# Patient Record
Sex: Female | Born: 1965
Health system: Southern US, Community
[De-identification: ages and names within clinical notes are randomized; demographics above are authoritative.]

## PROBLEM LIST (undated history)

## (undated) DIAGNOSIS — D582 Other hemoglobinopathies: Secondary | ICD-10-CM

## (undated) DIAGNOSIS — G5603 Carpal tunnel syndrome, bilateral upper limbs: Secondary | ICD-10-CM

## (undated) DIAGNOSIS — G43909 Migraine, unspecified, not intractable, without status migrainosus: Secondary | ICD-10-CM

## (undated) DIAGNOSIS — Z973 Presence of spectacles and contact lenses: Secondary | ICD-10-CM

## (undated) DIAGNOSIS — N883 Incompetence of cervix uteri: Secondary | ICD-10-CM

## (undated) DIAGNOSIS — G4733 Obstructive sleep apnea (adult) (pediatric): Secondary | ICD-10-CM

## (undated) DIAGNOSIS — M1990 Unspecified osteoarthritis, unspecified site: Secondary | ICD-10-CM

## (undated) DIAGNOSIS — I10 Essential (primary) hypertension: Secondary | ICD-10-CM

## (undated) DIAGNOSIS — O009 Unspecified ectopic pregnancy without intrauterine pregnancy: Secondary | ICD-10-CM

## (undated) HISTORY — DX: Migraine, unspecified, not intractable, without status migrainosus: G43.909

## (undated) HISTORY — DX: Incompetence of cervix uteri: N88.3

## (undated) HISTORY — DX: Essential (primary) hypertension: I10

## (undated) HISTORY — DX: Other hemoglobinopathies: D58.2

## (undated) HISTORY — PX: SALPINGECTOMY: SHX328

## (undated) HISTORY — DX: Unspecified ectopic pregnancy without intrauterine pregnancy: O00.90

---

## 1998-01-02 ENCOUNTER — Other Ambulatory Visit: Admission: RE | Admit: 1998-01-02 | Discharge: 1998-01-02 | Payer: Self-pay | Admitting: Obstetrics and Gynecology

## 1998-07-14 ENCOUNTER — Other Ambulatory Visit: Admission: RE | Admit: 1998-07-14 | Discharge: 1998-07-14 | Payer: Self-pay | Admitting: Obstetrics and Gynecology

## 1999-01-04 ENCOUNTER — Other Ambulatory Visit: Admission: RE | Admit: 1999-01-04 | Discharge: 1999-01-04 | Payer: Self-pay | Admitting: Gynecology

## 1999-07-20 ENCOUNTER — Other Ambulatory Visit: Admission: RE | Admit: 1999-07-20 | Discharge: 1999-07-20 | Payer: Self-pay | Admitting: Gynecology

## 1999-10-04 ENCOUNTER — Other Ambulatory Visit: Admission: RE | Admit: 1999-10-04 | Discharge: 1999-10-04 | Payer: Self-pay | Admitting: Gynecology

## 1999-10-04 ENCOUNTER — Encounter (INDEPENDENT_AMBULATORY_CARE_PROVIDER_SITE_OTHER): Payer: Self-pay

## 1999-11-09 ENCOUNTER — Ambulatory Visit (HOSPITAL_COMMUNITY): Admission: RE | Admit: 1999-11-09 | Discharge: 1999-11-09 | Payer: Self-pay | Admitting: Gynecology

## 1999-11-09 ENCOUNTER — Encounter (INDEPENDENT_AMBULATORY_CARE_PROVIDER_SITE_OTHER): Payer: Self-pay

## 2000-01-04 ENCOUNTER — Other Ambulatory Visit: Admission: RE | Admit: 2000-01-04 | Discharge: 2000-01-04 | Payer: Self-pay | Admitting: Gynecology

## 2000-06-03 DIAGNOSIS — Z5189 Encounter for other specified aftercare: Secondary | ICD-10-CM

## 2000-06-03 DIAGNOSIS — O009 Unspecified ectopic pregnancy without intrauterine pregnancy: Secondary | ICD-10-CM

## 2000-06-03 HISTORY — DX: Encounter for other specified aftercare: Z51.89

## 2000-06-03 HISTORY — DX: Unspecified ectopic pregnancy without intrauterine pregnancy: O00.90

## 2000-07-02 ENCOUNTER — Encounter: Admission: RE | Admit: 2000-07-02 | Discharge: 2000-09-30 | Payer: Self-pay | Admitting: Gynecology

## 2000-07-29 ENCOUNTER — Inpatient Hospital Stay (HOSPITAL_COMMUNITY): Admission: AD | Admit: 2000-07-29 | Discharge: 2000-07-29 | Payer: Self-pay | Admitting: Gynecology

## 2000-07-31 ENCOUNTER — Encounter (INDEPENDENT_AMBULATORY_CARE_PROVIDER_SITE_OTHER): Payer: Self-pay

## 2000-07-31 ENCOUNTER — Inpatient Hospital Stay (HOSPITAL_COMMUNITY): Admission: RE | Admit: 2000-07-31 | Discharge: 2000-08-02 | Payer: Self-pay | Admitting: Gynecology

## 2001-01-13 ENCOUNTER — Other Ambulatory Visit: Admission: RE | Admit: 2001-01-13 | Discharge: 2001-01-13 | Payer: Self-pay | Admitting: Gynecology

## 2001-08-12 ENCOUNTER — Other Ambulatory Visit: Admission: RE | Admit: 2001-08-12 | Discharge: 2001-08-12 | Payer: Self-pay | Admitting: Gynecology

## 2001-10-30 ENCOUNTER — Encounter: Payer: Self-pay | Admitting: Gynecology

## 2001-10-30 ENCOUNTER — Inpatient Hospital Stay (HOSPITAL_COMMUNITY): Admission: AD | Admit: 2001-10-30 | Discharge: 2001-11-03 | Payer: Self-pay | Admitting: Gynecology

## 2001-10-31 ENCOUNTER — Encounter: Payer: Self-pay | Admitting: Gynecology

## 2001-11-02 ENCOUNTER — Encounter: Payer: Self-pay | Admitting: Gynecology

## 2002-01-21 ENCOUNTER — Encounter: Payer: Self-pay | Admitting: Gynecology

## 2002-01-21 ENCOUNTER — Inpatient Hospital Stay (HOSPITAL_COMMUNITY): Admission: AD | Admit: 2002-01-21 | Discharge: 2002-01-30 | Payer: Self-pay | Admitting: Gynecology

## 2002-01-21 ENCOUNTER — Encounter (INDEPENDENT_AMBULATORY_CARE_PROVIDER_SITE_OTHER): Payer: Self-pay | Admitting: Specialist

## 2002-01-31 ENCOUNTER — Encounter: Admission: RE | Admit: 2002-01-31 | Discharge: 2002-03-02 | Payer: Self-pay | Admitting: Gynecology

## 2002-03-12 ENCOUNTER — Other Ambulatory Visit: Admission: RE | Admit: 2002-03-12 | Discharge: 2002-03-12 | Payer: Self-pay | Admitting: Gynecology

## 2003-03-15 ENCOUNTER — Other Ambulatory Visit: Admission: RE | Admit: 2003-03-15 | Discharge: 2003-03-15 | Payer: Self-pay | Admitting: Gynecology

## 2004-03-16 ENCOUNTER — Other Ambulatory Visit: Admission: RE | Admit: 2004-03-16 | Discharge: 2004-03-16 | Payer: Self-pay | Admitting: Gynecology

## 2005-03-18 ENCOUNTER — Other Ambulatory Visit: Admission: RE | Admit: 2005-03-18 | Discharge: 2005-03-18 | Payer: Self-pay | Admitting: Gynecology

## 2006-08-20 ENCOUNTER — Other Ambulatory Visit: Admission: RE | Admit: 2006-08-20 | Discharge: 2006-08-20 | Payer: Self-pay | Admitting: Gynecology

## 2007-12-24 ENCOUNTER — Ambulatory Visit (HOSPITAL_COMMUNITY): Admission: RE | Admit: 2007-12-24 | Discharge: 2007-12-24 | Payer: Self-pay | Admitting: Gynecology

## 2007-12-31 ENCOUNTER — Other Ambulatory Visit: Admission: RE | Admit: 2007-12-31 | Discharge: 2007-12-31 | Payer: Self-pay | Admitting: Gynecology

## 2008-02-22 ENCOUNTER — Ambulatory Visit: Payer: Self-pay | Admitting: Women's Health

## 2008-05-16 ENCOUNTER — Ambulatory Visit: Payer: Self-pay | Admitting: Women's Health

## 2008-12-19 ENCOUNTER — Ambulatory Visit: Payer: Self-pay | Admitting: Women's Health

## 2008-12-26 ENCOUNTER — Ambulatory Visit (HOSPITAL_COMMUNITY): Admission: RE | Admit: 2008-12-26 | Discharge: 2008-12-26 | Payer: Self-pay | Admitting: Gynecology

## 2009-01-05 ENCOUNTER — Encounter: Payer: Self-pay | Admitting: Women's Health

## 2009-01-05 ENCOUNTER — Other Ambulatory Visit: Admission: RE | Admit: 2009-01-05 | Discharge: 2009-01-05 | Payer: Self-pay | Admitting: Gynecology

## 2009-01-05 ENCOUNTER — Ambulatory Visit: Payer: Self-pay | Admitting: Women's Health

## 2009-12-18 ENCOUNTER — Ambulatory Visit: Payer: Self-pay | Admitting: Women's Health

## 2009-12-27 ENCOUNTER — Ambulatory Visit (HOSPITAL_COMMUNITY): Admission: RE | Admit: 2009-12-27 | Discharge: 2009-12-27 | Payer: Self-pay | Admitting: Gynecology

## 2010-01-10 ENCOUNTER — Ambulatory Visit: Payer: Self-pay | Admitting: Women's Health

## 2010-01-10 ENCOUNTER — Other Ambulatory Visit: Admission: RE | Admit: 2010-01-10 | Discharge: 2010-01-10 | Payer: Self-pay | Admitting: Gynecology

## 2010-03-05 ENCOUNTER — Ambulatory Visit: Payer: Self-pay | Admitting: Gynecology

## 2010-03-22 ENCOUNTER — Ambulatory Visit: Payer: Self-pay | Admitting: Women's Health

## 2010-04-11 ENCOUNTER — Ambulatory Visit: Payer: Self-pay | Admitting: Women's Health

## 2010-10-19 NOTE — Op Note (Signed)
[facility]  Patient:    Gabrielle Cross, Gabrielle Cross                      MRN: [id number] Proc. Date: 07/31/00 Adm. Date:  [id number] Attending:  Douglass Rivers                           Operative Report  PREOPERATIVE DIAGNOSES:       1. Right ectopic pregnancy.                               2. Shock.  POSTOPERATIVE DIAGNOSES:      1. Ruptured right ectopic pregnancy, mid                                  ampullary portion.                               2. Hemorrhagic shock.                               3. Fibroma, right ovary?  PROCEDURES PERFORMED:         1. Exploratory laparotomy.                               2. Right salpingectomy.                               3. Ovarian adhesiolysis.                               4. Wedge biopsy of right ovary.  SURGEON:                      Juan H. Lily Peer, M.D.  FIRST ASSISTANT:              Douglass Rivers, M.D.  ANESTHESIA:                   General endotracheal anesthesia.  ESTIMATED BLOOD LOSS:  INDICATIONS FOR OPERATION:    A 45 year old gravida 1, para 0 with suspected right ectopic pregnancy.  She was hypotensive, clammy, cold and in shock.  She was taken for emergency exploratory laparotomy and removal of right ectopic pregnancy previous diagnosed on February 27.  FINDINGS:                     1. Hemoperitoneum, approximately 1 L.                               2. Right ruptured ectopic mid ampullary region                                  extending into the cornual region.  3. Growth on right ovary, 1.5 x 2 cm, irregular                                  shaped (fibroma?).                               4. Periovarian adhesions.                               5. Left tube and ovary and uterus otherwise                                  normal.                               6. Normal appearing appendix.  DESCRIPTION OF PROCEDURE:     After the patient was counseled, she was  taken to emergently to the operating room, where she underwent general endotracheal anesthesia.  The abdomen, vagina and perineum were prepped and draped in the usual sterile fashion.  A Hulka tenaculum had been placed for manipulation during the operation through the minilap incision to be able to bring the adnexa to view.  After the drapes and Foley were in place, A small Pfannenstiel incision was made 2 cm above the symphysis pubis.  The incision was carried down through the skin and subcutaneous tissue down to the rectus fascia, whereby a midline nick was made.  The fascia was extended laterally. The peritoneal cavity was entered.  Immediately, blood and blood clots were evident as well as hemoperitoneum.  After removal of the blood clots and placing the patient in the Trendelenburg position, and after placement of OConner-OSullivan retractors, it was noted that she had a ruptured right ectopic pregnancy in the mid portion of the fallopian tube but, with the edema, the ectopic extended all the way through the distal ampullary portion to the cornual region, thus making it difficult to salvage that tube.  We went ahead and proceeded with a right salpingectomy by placing Kelly clamps in the mesosalpinx and excising the right tube.  The mesosalpinx was reapproximated with sutures of 3-0 Vicryl suture in a locking stitch manner.  At this time, it was noted that the right ovary had an irregularly-shaped growth on its surface, which appeared to be a fibroma, and a wedge biopsy was made and submitted for histological evaluation.  Periovarian adhesions were taken down and also submitted for histological evaluation.  The contralateral tube and ovary were normal.  The rest of the uterine surface was normal.  The appendix was normal on gross inspection.  The pelvic cavity was copiously irrigated with normal saline solution.  INTERCEED was then placed over the ovarian wedge biopsy site as well as  the mesosalpinx where the right salpingectomy had been performed to prevent further adhesion formation.  Sponge and needle counts were correct.  The OConner-OSullivan retractor was removed.  The rectus fascia was approximated with a running stitch of 0 Vicryl suture. The subcutaneous bleeders were Bovie cauterized.  The skin was reapproximated with skin clips, followed by placement of Xeroform gauze and 4 x dressing.  The Hulka tenaculum was removed.  The patient was extubated and  transferred to the recovery room with stable vital signs.  Blood loss was approximately 1000 cc. IV fluid was 5 L of lactated Ringers.  Urine output was 400 cc.  She did receive 2 g of Cefotan prophylactically.  Of note, the patient yesterday was seen in the office and a right ectopic pregnancy was diagnosed.  She received methotrexate for treatment.  She had a hemoglobin of 13.1.  Today in the office when she presented with pain and in shock, her hemoglobin was 11.0 and, prior to taking her to the operating room, her hemoglobin had dropped down to 10 with a hematocrit of 30, platelet count 450,000.  Her blood type is O positive. DD:  07/31/00 TD:  07/31/00 Job: [id number]

## 2010-10-19 NOTE — Consult Note (Signed)
[facility]  Patient:    Gabrielle Cross, Gabrielle Cross Visit Number: [id number] MRN: [id number]          Service Type: OBS Location: 910B 9151 01 Attending Physician:  Merrily Pew Dictated by:   Nadyne Coombes Fontaine, M.D. Consultation Date: 10/31/01 Admit Date:  10/30/2001                            Consultation Report  HISTORY:                      The patient was started on magnesium sulfate last night due to uterine contractions and irritability, and has been without contractions since initiation.  The patient has done well throughout the night in Trendelenburg with Foley catheter in place.  Repeat ultrasound this morning showed essentially unchanged findings, with the bulging membranes to the level of the external os, with a measurable cervix length of 0.7 cm.  I discussed with the patient and her family members the current critical situation and issues as to whether to proceed with cervical cerclage at present versus continuing expectant management, hoping that there would be some further retraction of the membranes.  The patient has been in a Trendelenburg position since yesterday afternoon.  She has received IV antibiotics of Unasyn and has been on the magnesium tocolysis.  I reviewed with them the risks of cerclage to include the immediate intraoperative and postoperative risks of rupture of membranes, bleeding, infection and damage to surrounding tissues including the vagina, bladder and bowel.  I reviewed with them the long-term risk that we may get through the cerclage and then have a complication such as infection, rupture of membranes or subsequent labor leading to either previable or periviable delivery and having a severely affected infant.  The issues of whether to proceed with cerclage now, again risking the immediate pregnancy loss versus waiting another day or two was discussed and the risk of waiting with subsequent rupture of  membranes or loss of the pregnancy due to post[poning the cerclage was reviewed with them.  The patient also understands that a successful cerclage is not a guarantee that she will continue her pregnancy, and that bed rest and other preventive measures will be necessary and that we certainly are at risk of tearing out the cerclage given the evidence of the poor cervical strength.  After a lengthy discussion, I feel that we really have nothing further to gain, having been approaching 24 hours in Trendelenburg, having received her antibiotics.  She is on the magnesium tocolysis and has remained quiet since last evening, that further management will probably afford no more benefit and may, in fact, risk the pregnancy when bowel movement or other valsalva-type maneuvers are required.  The patient agrees with the plan.  We plan on proceeding with a cerclage, again, the patient understanding the most immediate risks and accepting the risk of loss of the pregnancy.  The patient does understand that, if rupture of membranes occurs, either intraoperatively or postoperatively, we cannot proceed with her cerclage, and we will have to remove the cerclage and then she will subsequently loose the pregnancy.  The patients questions were answered to her satisfactory and she is ready to proceed with surgery. Dictated by:   Nadyne Coombes. Fontaine, M.D. Attending Physician:  Merrily Pew DD:  10/31/01 TD:  10/31/01 Job: [phone]

## 2010-10-19 NOTE — Discharge Summary (Signed)
[facility]  Patient:    Gabrielle Cross, Gabrielle Cross                      MRN: [id number] Adm. Date:  [id number] Disc. Date: [id number] Attending:  Tonye Royalty Dictator:   Antony Contras, NP                           Discharge Summary  DISCHARGE DIAGNOSES:          1. Ruptured right ectopic pregnancy                                  mid-ampullary region.                               2. Hemorrhagic shock.                               3. Questionable fibroma of the right ovary.  PROCEDURE:                    Exploratory laparotomy, right salpingectomy, wedge biopsy of the right ovary, lysis of adhesions.  HISTORY OF PRESENT ILLNESS:   Patient is a 45 year old gravida 1, para 0 with an LMP of May 24, 2000, [facility] February 28, 2001.  She is currently at 9-1/2 weeks by estimated gestation and had been previously seen in the office on July 29, 2000 on a new OB appointment.  An ultrasound had been ordered due to the fact she had a history of questionable irregular cycles. Ultrasound had demonstrated that at that time she had a right ectopic pregnancy that measured 30 x 23 x 22 mm with central stick focus measuring 12 x 11 mm.  Left upper adnexa was negative.  There was no fluid in the cul-de-sac.  She was initially treated with methotrexate as she had met the criteria and it was totally instantaneous.  She did present to the office again on July 31, 2000 complaining of abdominal pain and feeling weak and ready to pass out.  At that time she was cold and clammy and an ultrasound was done which demonstrated the presence of a right ectopic pregnancy but now there was free fluid in the midline above the left ovary and medial to the right ovary as well.  She was admitted for emergency surgery.  HOSPITAL COURSE/TREATMENT:    Patient was taken to the OR where exploratory laparotomy, right salpingectomy, lysis of adhesions with biopsy of right  ovary was performed by Dr. Lily Peer, assisted by Dr. Farrel Gobble, under general anesthesia.  Findings revealed a hemoperitoneum greater than 1 L, right ruptured ectopic mid-ampullary region extending to the corneal region, questionable fibroma on the right ovary, paraovarian adhesions.  Left tube, ovary, and uterus otherwise normal.  Normal-appearing appendix.  Patient tolerated surgical procedures well.  She did sustain a hemoglobin decrease down to 5.6.  She had initially been 13 on July 29, 2000 and had dropped by July 31, 2000 to 11.  At that time it was decided to transfuse her. She did receive two units of packed cells.  She did remain afebrile and had no difficulty voiding and was able to be discharged in satisfactory condition on her second postoperative day.  DISPOSITION:  She will be followed up in the office in one week. DD:  08/18/00 TD:  08/19/00 Job: [id number]

## 2010-10-19 NOTE — Discharge Summary (Signed)
[facility]  Patient:    Gabrielle Cross, Gabrielle Cross Visit Number: [id number] MRN: [id number]          Service Type: MED Location: 910D 9126 01 Attending Physician:  Merrily Pew Dictated by:   Antony Contras, [facility] Admit Date:  10/30/2001 Discharge Date: 11/03/2001                             Discharge Summary  DISCHARGE DIAGNOSIS:  Incompetent cervix, 19 weeks pregnancy.  PROCEDURE:  Cervical cerclage, [employer] type.  HISTORY OF PRESENT ILLNESS:  The patient is a 45 year old, gravida 2, para 1, AB 1, history of ectopic, last menstrual period 06/16/01, estimated date of confinement 03/25/02.  The patient presented to office for routine screening ultrasound at 19-1/2 weeks and was found to have evidence of an incompetent cervix.  She had no vaginal bleeding or cramping.  Prenatal course to date had been unremarkable.  HOSPITAL COURSE AND TREATMENT:  The patient was admitted to [facility]. On external monitor she was found to have contractions about three to four minutes apart.  She was placed in Trendelenburg position, magnesium sulfate bolus was initiated.  She also received Unasyn.  A six cervical cerclage [employer] type was performed by Dr. Audie Box on 10/30/01, under spinal anesthesia.  Findings included UVA revealed external cervical os dilated with membranes neutralized, single 5 mm nursling band placed, not tied at 12 oclock.  Postoperatively, the patient remained afebrile, had no difficulty with voiding.  She had no further cervical change, no further contractions, and was able to be discharged in satisfactory condition on 11/03/01.  DISPOSITION:  The patient was discharged home with bedrest.  DISCHARGE MEDICATIONS:  Ampicillin 500 mg q.i.d. x12.  FOLLOWUP:  She was to be seen in the office in one week and given stool softeners. Dictated by:   Antony Contras, [facility] Attending Physician:  Merrily Pew DD:  11/16/01 TD:   11/17/01 Job: [id number]

## 2010-10-19 NOTE — H&P (Signed)
[facility]  Patient:    Gabrielle Cross, Gabrielle Cross                      MRN: [id number] Adm. Date:  [id number] Attending:  Douglass Rivers                         History and Physical  CHIEF COMPLAINT:              Abdominal pain with right ectopic pregnancy.  HISTORY OF PRESENT ILLNESS:   The patient is a 45 year old, gravida 1, para 0, last menstrual period May 24, 2000.  Estimated day of confinement February 29, 2000.  The patient currently 9-1/2 weeks by estimated gestation weight.  She was seen in the office July 29, 2000 on a new OB appointment and an ultrasound had been ordered due to the fact that she had a history of questionable irregular periods.  The ultrasound had demonstrated that she had a right ectopic pregnancy.  It measured 30 by 23 by 22 mm with a central cystic focus measuring 12 by 11 mm. Left adnexa was normal and there was no fluid in the cul-de-sac.  She presented to the office today stating that she started having abdominal pain early this morning, felt weak and ready to pass out.  She was cold and clammy when she presented to the office and ultrasound was done which demonstrated the presence of the right ectopic pregnancy but now there was free fluid seen in the midline above the left ovary and medial to the right ovary as well.  On the 26th, she had methotrexate.  She was to be treated conservatively due to the fact that she met the criteria to be treated with methotrexate and it was totally instantaneous.  Her hemoglobin prior to administration of methotrexate was 13.1.  In our office, her hemoglobin was 11.1 and hematocrit 32.1.  Her white blood count was 19.5 thousand and platelet 344,000.  She was taken to the hospital, and prior to being taken to the operating room, her hemoglobin and hematocrit had dropped to 10.0 and 30.0 respectively.  Her blood type is O positive and she was counselled for an emergency  exploratory laparotomy and possible right salpingoscopy, possible right salpingectomy, and possible right salpingo-oophorectomy.  PAST MEDICAL HISTORY:         She had a endometrial polyp removed in May of 2001.  She has otherwise lived healthy.  ALLERGIES:                    She denies any allergies.  PHYSICAL EXAMINATION:  VITAL SIGNS:                  Blood pressure was 90/48, pulse 109.  GENERAL:                      She was cold, clammy, and in a significant amount of pain.  LUNGS:                        Clear to auscultation.  HEART:                        Tachycardic with no murmurs.  ABDOMEN:                      Tender with rebound and guarding.  PELVIC:                       Not performed due to the fact that she just had a transvaginal ultrasound with the above-mentioned findings.  EXTREMITIES:                  No edema.  Reflexes were fine.  LABORATORY DATA:              Patients initial prenatal labs that we have from our office demonstrated a blood type of O positive, negative antibody screen.  VDRL was nonreactive.  Hepatitis B surface-antigen and HIV were negative.  Rubella immuned.  ASSESSMENT:                   A 45 year old, gravida 1, para 0, with known ectopic pregnancy as recently as two days ago began to be treated with methotrexate conservatively.  She presented to the office today cold, clammy, in shock with an increasing amount of pain.  Ultrasound now is showing evidence of fluid in the abdomen consistent with a ruptured ectopic and she is being rushed for an emergency exploratory laparotomy, possible right salpingectomy, possible right salpingoscopy, possible right salpingo-oophorectomy.  The patient also was counselled the possibility for the need to have a blood transfusion with the potential risk such as hepatitis, AIDS, and anaphylactic reaction.  Also the potential complications include trauma to internal organs with the need for corrective  surgery and in the remote possibility of uncontrollable hemorrhage she could lose totally her reproductive organs.  All this was discussed with her and her husband.  Her questions were answered and we will follow according to the plan as listed above.DD:  07/31/00 TD:  07/31/00 Job: [id number]

## 2010-10-19 NOTE — H&P (Signed)
NAME:  Gabrielle Cross, Gabrielle Cross                         ACCOUNT NO.:  [id number]   MEDICAL RECORD NO.:  [id number]                   PATIENT TYPE:  INP   LOCATION:  9159                                 FACILITY:  [facility]   PHYSICIAN:  Timothy P. Fontaine, M.D.           DATE OF BIRTH:  03-08-1966   DATE OF ADMISSION:  01/21/2002  DATE OF DISCHARGE:                                HISTORY & PHYSICAL   CHIEF COMPLAINT:  Rupture of membranes.   HISTORY OF PRESENT ILLNESS:  A 45 year old G2, P0, AB1 female at [redacted] weeks  gestation.  History of incompetent cervix with a salvage Cerclage placed at  [redacted] weeks gestation; at which time she had visible membranes noted.  The  patient has done with bed rest up until this morning, when she noticed a  gush of fluid and repeated gushes of fluid.  On office evaluation was found  to be grossly ruptured.  The patient is admitted at this time for continuing  care.   PAST MEDICAL HISTORY:  Uncomplicated.   PAST SURGICAL HISTORY:  Uterine polyp  excision with D&C.  Wisdom teeth  surgery.   ALLERGIES:  SHELL FISH.   CURRENT MEDICATIONS:  Terbutaline 2.5 mg q.4h.  Prenatal vitamins.   REVIEW OF SYSTEMS:  Noncontributory .   SOCIAL HISTORY:  Noncontributory.   FAMILY HISTORY:  Noncontributory.   PHYSICAL EXAMINATION:  VITAL SIGNS:  Afebrile.  Vital signs are stable.  HEENT:  Normal.  LUNGS:  Clear.  CARDIAC:  Regular rate, without murmurs, rubs or gallops.  ABDOMEN:  With gravid fundus, consistent with dates.  External monitors show  a reactive fetal tracing, with assured term.  Initial evaluation showing  contractions every 4-5 min; not palpated by the patient.  PELVIC:  Deferred.   LABORATORY DATA:  Beta strep and vaginal culture taken.   ASSESSMENT:  A 45 year old G2, P0, AB1 female at [redacted] weeks gestation.  Incompetent cervix, Cerclage in place; with gross rupture of membranes.  Speculum exam in the office by P.A. showed clear fluid with cervix  overall  closed.  External monitors now show some early contractions, which are not  palpated by the patient.  The patient had been on Terbutaline prenataly due  to some contractions, although these were felt to be more Braxton-Hicks,  with her last dose at 4 a.m.  Discussed situation with the patient and her  husband in the various issues; to include:  31 weeks with premature rupture  of the membranes, as well as Cerclage in place.  I reviewed that there  various scenarios possible, as far as her situation.  There is some  controversy as far as her Cerclage, as to whether to remove it now or leave  it in place.  We have agreed to initially give her one injection of  Terbutaline to calm the mild contractions she is having now.  IV hydration,  initiation to antibiotics (to include  Unasyn 3 mg IV piggyback, and then 1.5  g q.6h. Betamethasone 12 mg now, with repeat in 24 hr.  Will leave her  Cerclage in place.  If without contractions, will monitor expectantly.  I  discussed the issue as to if she was undelivered several days from now, as  to whether to remove the Cerclage at that time, versus living in situ;  the  issues of fetal as well as some maternal spesis and significant sequelae  potential was reviewed with her.  We did discuss that if indeed she begins  to contract today, then I may transiently poke a lyser with magnesium to  allow steroid delivery; but that we would subsequently discontinue this.  If  she indeed becomes contractile following this, we would have to remove the  Cerclage and proceed with delivery.  She also understands that if she  becomes febrile, that we would need to proceed with delivery if there is not  an overt nonobstetrical cause.  We will also check a baseline CBC at this  time for white count.  I have ordered a baseline ultrasound for estimated  fetal weight at bedside, after she is admitted.  Will plan on having the DNA  team talk with her also.                                                Timothy P. Audie Box, M.D.    TPF/MEDQ  D:  01/21/2002  T:  01/21/2002  Job:  [id number]

## 2010-10-19 NOTE — Op Note (Signed)
[facility]  Patient:    Gabrielle Cross, Gabrielle Cross                      MRN: [id number] Proc. Date: 11/09/99 Adm. Date:  [id number] Disc. Date: [id number] Attending:  Tonye Royalty                           Operative Report  PREOPERATIVE DIAGNOSIS:       Endometrial polyp/submucous myoma.  POSTOPERATIVE DIAGNOSIS:      Endometrial polyps.  OPERATION:                    Resectoscopic polypectomy.  SURGEON:                      Juan H. Lily Peer, M.D.  ASSISTANT:  ANESTHESIA:  ESTIMATED BLOOD LOSS:  INDICATIONS:                  A 45 year old Gravida 0, with recent evaluation for lower abdominal pelvic pain was found to have on hysterogram an endometrial lesion possible polyp versus myoma.  FINDINGS:                     Normal endocervical canal.  The patient had a polypoid lesion coming from the right anterior uterine wall, the lower uterine segment.  Both tubal ostia were identified and smaller polyp was noted near the upper right uterine side wall near the cornual region.  DESCRIPTION OF PROCEDURE:     After the patient was adequately counselled she was taken to the operating room where she successfully underwent a general endotracheal anesthesia.  She had received a gram of Ceftin IV prophylactically before the commencement of her surgery.  Review of her admission labs had demonstrated that on her urinalysis there were 11 to 20 WBCs and many bacteria.  The patient was taken to the operating room where she was placed in the lithotomy position.  The abdomen was prepped and draped in the usual sterile fashion.  A red rubber Roxan Hockey was inserted to evacuate the bladder contents. A laminaria which was placed the day before in the office was removed.  Thus requiring a cervical dilatation.  The patients anterior cervical lip was grasped with a single tooth tenaculum and the [facility] operating resectoscope with a 90 degree wire loop connected to  electrical surgical generator was inserted through the endocervical canal into the intrauterine cavity.  The staining media with 3% sorbitol. In a systematic fashion the endocervical canal appeared smooth and the endometrial cavity had a polypoid lesion coming off the anterior wall and a smaller one near the upper aspect of the right uterine wall near the cornu region.  The settings on the electrical surgical generation were an 80 watts cutting mode and 80 watts on the coagulation mode.  Then with a 80 degree wire loop these polyps were excised and passed off for histological evaluation.  No other lesions were noted.  The instruments were removed.  The single tooth tenaculum was removed.  The patient was extubated, awakened and transferred to the recovery room with stable vital signs. Blood loss from the procedure was minimal.  Fluid resuscitation consisted of 1100 cc of lactated Ringers.  Will make sure that the patient had a urine culture in the office next week to make sure that her asymptomatic bacteria has cleared. DD:  11/09/99 TD:  11/12/99 Job:  [id number]

## 2010-10-19 NOTE — Op Note (Signed)
[facility]  Patient:    Gabrielle, Cross Visit Number: [id number] MRN: [id number]          Service Type: OBS Location: 910B 9151 01 Attending Physician:  Merrily Pew Dictated by:   Nadyne Coombes Fontaine, M.D. Proc. Date: 10/30/01 Admit Date:  10/30/2001                             Operative Report  PREOPERATIVE DIAGNOSES:       1. Incompetent cervix.                               2. Pregnancy at [redacted] weeks gestation.  POSTOPERATIVE DIAGNOSES:      1. Incompetent cervix.                               2. Pregnancy at [redacted] weeks gestation.  PROCEDURE:                    Cervical cerclage, McDonald type.  SURGEON:                      Timothy P. Fontaine, M.D.  ANESTHESIA:                   Spinal.  ESTIMATED BLOOD LOSS:         Minimal.  COMPLICATIONS:                None.  SPECIMENS:                    None.  FINDINGS:                     EUA revealed external cervical os dilated with membranes visualized.  Single 5 mm Mersilene band placed, knot tied at 12 oclock.  DESCRIPTION OF PROCEDURE:     The patient was taken to the operating room and underwent spinal anesthesia.  She was placed in the dorsal lithotomy position and received a perineal and vaginal preparation with Hibiclens solution. Initial digital vaginal exam gentle noted the external os felt to be close, although there was no pressure or probing done during the digital exam.  The patient was draped in the usual fashion.  The bladder was filled with normal saline solution in an attempt to help reduce the membranes.  A weighted speculum was placed posteriorly and a Sims retractor anteriorly.  Initial inspection showed that the external os was indeed dilated, with bulging membranes visualized.  The upper vagina was copiously irrigated using warm normal saline in an attempt to reduce the bacterial load.  The bladder was filled with normal saline solution in an attempt to help  reduce the membranes. The patient was placed in deep Trendelenburg and a 30 cc Foley catheter was prepared with the tip removed.  This was subsequently placed within the cervical canal and inflated to help reduce the membranes.  With the assistance of ring forceps, gentle traction was placed on the anterior and posterior lip of the cervix.  Using a 5-mm Mersilene tape, a McDonald cerclage was placed without difficulty, noting a good amount of cervix obtained.  The Mersilene band was then tied down to the level of the catheter.  The bulb was then deflated, the band tightened slightly  although not completely, and then multiple ties were placed for future identification of the knot.  Excess suture was then excised.  The vagina was then copiously irrigated with saline, again to reduce bacterial load.  The bladder was then emptied.  The patient was placed in the supine position and was taken to the recovery room in good condition, having tolerated the procedure well. Dictated by:   Nadyne Coombes. Fontaine, M.D. Attending Physician:  Merrily Pew DD:  10/31/01 TD:  10/31/01 Job: [id number]

## 2010-10-19 NOTE — Discharge Summary (Signed)
   NAME:  Gabrielle Cross, Gabrielle Cross                         ACCOUNT NO.:  [id number]   MEDICAL RECORD NO.:  [id number]                   PATIENT TYPE:  INP   LOCATION:  9112                                 FACILITY:  [facility]   PHYSICIAN:  Timothy P. Fontaine, M.D.           DATE OF BIRTH:  July 13, 1965   DATE OF ADMISSION:  01/21/2002  DATE OF DISCHARGE:  01/30/2002                                 DISCHARGE SUMMARY   DISCHARGE DIAGNOSES:  1. Intrauterine pregnancy at 31+ weeks with premature rupture of membranes.  2. History of incompetent cervix.  3. McDonald cerclage.   PROCEDURE:  Normal spontaneous vaginal delivery of viable preterm infant  over midline episiotomy; birth weight 3 pounds 15 ounces, Apgars 8 and 9.   HISTORY OF PRESENT ILLNESS:  The patient is a 45 year old gravida 2 para 0-0-  1-0; LMP June 16, 2001; [facility] March 25, 2002.  Prenatal course was  complicated by incompetent cervix for which the patient had a McDonald  cerclage on May 31.  The patient previously had a ruptured ectopic with her  first pregnancy.  She was also maintained on terbutaline 2.5 mg p.o. q.4h.   LABORATORY DATA:  Blood type O positive, antibody screen negative.  RPR,  HBsAg, HIV nonreactive.  GBS negative.   HOSPITAL COURSE AND TREATMENT:  The patient presented on January 21, 2002  with premature rupture of membranes at 31 and four-sevenths weeks.  At the  present time a cerclage was intact.  She was managed expectantly with  monitoring, IV antibiotics, and did well until January 28, 2002 at which time  she began to have increased contractions, pelvic pressure, and was found to  be dilated to 8 cm.  At that time she proceeded to deliver an Apgar 8 and 67  female infant weighing 3 pounds 15 ounces over a midline episiotomy.   POSTPARTUM COURSE:  She remained afebrile, had no difficulty voiding, was  able to be discharged in satisfactory condition on postpartum day #2.  The  baby was doing well in NICU at  the time of her discharge.  CBC:  Hematocrit  32.4, hemoglobin 10.8, wbc's 13.7, platelets 236.   DISPOSITION:  1. Follow up in six weeks.  2. Tylox for pain.  3. Continue with prenatal vitamins and iron.     Elwyn Lade . Hancock, N.P.                Timothy P. Audie Box, M.D.    MKH/MEDQ  D:  03/01/2002  T:  03/02/2002  Job:  [id number]

## 2010-10-19 NOTE — H&P (Signed)
[facility]  Patient:    Gabrielle Cross, Gabrielle Cross                       MRN: [id number] Adm. Date:  11/09/99 Attending:  Gaetano Hawthorne. Lily Peer, M.D.                         History and Physical  CHIEF COMPLAINT:              Intrauterine lesion.  HISTORY OF PRESENT ILLNESS:   The patient is a 45 year old gravida 0 who is scheduled to undergo resectoscopic polypectomy/possible myomectomy due to incidental finding on ultrasound at time of workup for pelvic pain.  Patient was noted that the ultrasound on May 3 of this year demonstrated two echogenic foci seen within the anterior endometrium and impinging on the canal 6 mm and 4 mm respectively.  She had endometrial biopsy at that time which was reported to be normal.  She has also had a repeat 17-hydroxyprogesterone level which was normal due to the fact she had some hair growth which has responded with the _________ oral contraceptive pill.  She had a DHAS in the past which was normal and testosterone previously was slightly elevated.  As mentioned she is currently on _________ 20 _________ contraceptive pill.  Patient has been provided with literature information on resectoscopic polypectomy/myomectomy. She presented to the office today on June 7, where a laminaria was placed intracervical to facilitate tomorrows procedure for insertion of the upper resectoscope.  Patient was recently treated for sinusitis on Cefotan 250 mg b.i.d. for a seven day course on May 15.  REVIEW OF SYSTEMS:            The patient also had a false positive PPD with normal chest x-ray last year.  Did not require any medication.  She denies any previous surgery.  Denies smoking or alcohol consumption.  Her menses now have been regular otherwise.  ALLERGIES:                    No medical allergies.  MEDICATIONS:                  Recently she was treated with Cefotan 250 mg b.i.d. for a seven day course.  PHYSICAL EXAMINATION:  GENERAL:                       Well-developed, well-nourished female.  HEENT:                        Unremarkable.  NECK:                         Supple.  Trachea midline.  No carotid bruits, no thyromegaly.  LUNGS:                        Clear to auscultation without rhonchi or wheezes.  HEART:                        Regular rate and rhythm, no murmurs or gallops.  BREASTS:                      Not done.  At time of her annual exam was reported to be normal.  ABDOMEN:  Soft, nontender without rebound or guarding.  PELVIC:                       _________ within normal limits.  Vagina and cervix:  No lesion or discharge.  Uterus slightly anteverted, normal size, shape, and consistency.  Mixed without mass or tenderness.  RECTAL:                       Deferred.  ASSESSMENT:                   A 45 year old gravida 0 patient with incidental finding on ultrasound when working up for pelvic pain and she was found to have an intrauterine lesion, polyp versus myoma.  Patient no longer having any abdominal pain.  She would like to have this resolved before attempting to get pregnant.  Patient previously provided with literature information on resectoscopic polypectomy/myomectomy.  Risks, benefits, pros, and cons were discussed to include infection, bleeding, and trauma to internal organs.  In the event of perforation of the uterus, open laparotomy technique may need to be utilized in an effort to correct the damaged organ.  Patient is aware of all this and accepts and will follow with planned scheduled procedure.  PLAN:                         Patient scheduled for resectoscopic polypectomy/myomectomy scheduled for tomorrow, June 8, at [facility]. DD:  11/08/99 TD:  11/08/99 Job: [id number]

## 2010-11-19 ENCOUNTER — Other Ambulatory Visit: Payer: Self-pay | Admitting: Women's Health

## 2010-11-19 DIAGNOSIS — Z1231 Encounter for screening mammogram for malignant neoplasm of breast: Secondary | ICD-10-CM

## 2010-12-31 ENCOUNTER — Ambulatory Visit (HOSPITAL_COMMUNITY)
Admission: RE | Admit: 2010-12-31 | Discharge: 2010-12-31 | Disposition: A | Discharge status: Home / Self Care | Payer: Managed Care, Other (non HMO) | Source: Ambulatory Visit | Attending: Women's Health | Admitting: Women's Health

## 2010-12-31 DIAGNOSIS — Z1231 Encounter for screening mammogram for malignant neoplasm of breast: Secondary | ICD-10-CM

## 2011-01-14 ENCOUNTER — Encounter: Payer: Managed Care, Other (non HMO) | Admitting: Women's Health

## 2011-06-17 ENCOUNTER — Other Ambulatory Visit: Payer: Self-pay | Admitting: Women's Health

## 2011-09-16 ENCOUNTER — Encounter: Payer: Managed Care, Other (non HMO) | Admitting: Women's Health

## 2011-11-12 ENCOUNTER — Other Ambulatory Visit: Payer: Self-pay | Admitting: Women's Health

## 2011-11-12 DIAGNOSIS — Z1231 Encounter for screening mammogram for malignant neoplasm of breast: Secondary | ICD-10-CM

## 2012-01-02 ENCOUNTER — Ambulatory Visit (HOSPITAL_COMMUNITY)
Admission: RE | Admit: 2012-01-02 | Discharge: 2012-01-02 | Disposition: A | Discharge status: Home / Self Care | Payer: Managed Care, Other (non HMO) | Source: Ambulatory Visit | Attending: Women's Health | Admitting: Women's Health

## 2012-01-02 DIAGNOSIS — Z1231 Encounter for screening mammogram for malignant neoplasm of breast: Secondary | ICD-10-CM | POA: Insufficient documentation

## 2012-01-09 ENCOUNTER — Ambulatory Visit (INDEPENDENT_AMBULATORY_CARE_PROVIDER_SITE_OTHER): Payer: Managed Care, Other (non HMO) | Admitting: Women's Health

## 2012-01-09 ENCOUNTER — Encounter: Payer: Self-pay | Admitting: Women's Health

## 2012-01-09 ENCOUNTER — Other Ambulatory Visit (HOSPITAL_COMMUNITY)
Admission: RE | Admit: 2012-01-09 | Discharge: 2012-01-09 | Disposition: A | Discharge status: Home / Self Care | Payer: Managed Care, Other (non HMO) | Source: Ambulatory Visit | Attending: Women's Health | Admitting: Women's Health

## 2012-01-09 VITALS — BP 110/70 | Ht 60.25 in

## 2012-01-09 DIAGNOSIS — Z1151 Encounter for screening for human papillomavirus (HPV): Secondary | ICD-10-CM | POA: Insufficient documentation

## 2012-01-09 DIAGNOSIS — Z13220 Encounter for screening for lipoid disorders: Secondary | ICD-10-CM

## 2012-01-09 DIAGNOSIS — Z01419 Encounter for gynecological examination (general) (routine) without abnormal findings: Secondary | ICD-10-CM | POA: Insufficient documentation

## 2012-01-09 DIAGNOSIS — IMO0001 Reserved for inherently not codable concepts without codable children: Secondary | ICD-10-CM

## 2012-01-09 DIAGNOSIS — Z833 Family history of diabetes mellitus: Secondary | ICD-10-CM

## 2012-01-09 DIAGNOSIS — G43909 Migraine, unspecified, not intractable, without status migrainosus: Secondary | ICD-10-CM | POA: Insufficient documentation

## 2012-01-09 DIAGNOSIS — Z309 Encounter for contraceptive management, unspecified: Secondary | ICD-10-CM

## 2012-01-09 LAB — CBC WITH DIFFERENTIAL/PLATELET
Eosinophils Absolute: 0 10*3/uL (ref 0.0–0.7)
Hemoglobin: 14.3 g/dL (ref 12.0–15.0)
Lymphocytes Relative: 32 % (ref 12–46)
Lymphs Abs: 2.1 10*3/uL (ref 0.7–4.0)
MCH: 28 pg (ref 26.0–34.0)
Monocytes Relative: 6 % (ref 3–12)
Neutro Abs: 4.1 10*3/uL (ref 1.7–7.7)
Neutrophils Relative %: 62 % (ref 43–77)
RBC: 5.1 MIL/uL (ref 3.87–5.11)
WBC: 6.6 10*3/uL (ref 4.0–10.5)

## 2012-01-09 LAB — LIPID PANEL
Cholesterol: 185 mg/dL (ref 0–200)
HDL: 38 mg/dL — ABNORMAL LOW (ref >39)
Total CHOL/HDL Ratio: 4.9 Ratio
Triglycerides: 132 mg/dL (ref <150)

## 2012-01-09 LAB — GLUCOSE, RANDOM: Glucose, Bld: 83 mg/dL (ref 70–99)

## 2012-01-09 MED ORDER — NORETHINDRONE 0.35 MG PO TABS: 1.0000 | ORAL_TABLET | Freq: Every day | ORAL | Status: DC

## 2012-01-09 NOTE — Addendum Note (Signed)
Addended by: Richardson Chiquito on: 01/09/2012 09:08 AM   Modules accepted: Orders

## 2012-01-09 NOTE — Patient Instructions (Addendum)

## 2012-01-09 NOTE — Progress Notes (Signed)
Gabrielle Cross 1966-02-11 [id number]    History:    The patient presents for annual exam.  Contraceptives on Micronor with rare bleeding with no complaints. History of normal mammograms and Paps, last Pap 2011. History of migraines, rare, taking imipramine daily.   Past medical history, past surgical history, family history and social history were all reviewed and documented in the EPIC chart. 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   ROS:  A  ROS was performed and pertinent positives and negatives are included in the history.  Exam:  Filed Vitals:   01/09/12 0800  BP: 110/70    General appearance:  Normal Head/Neck:  Normal, without cervical or supraclavicular adenopathy. Thyroid:  Symmetrical, normal in size, without palpable masses or nodularity. Respiratory  Effort:  Normal  Auscultation:  Clear without wheezing or rhonchi Cardiovascular  Auscultation:  Regular rate, without rubs, murmurs or gallops  Edema/varicosities:  Not grossly evident Abdominal  Soft,nontender, without masses, guarding or rebound.  Liver/spleen:  No organomegaly noted  Hernia:  None appreciated  Skin  Inspection:  Grossly normal  Palpation:  Grossly normal Neurologic/psychiatric  Orientation:  Normal with appropriate conversation.  Mood/affect:  Normal  Genitourinary    Breasts: Examined lying and sitting.     Right: Without masses, retractions, discharge or axillary adenopathy.     Left: Without masses, retractions, discharge or axillary adenopathy.   Inguinal/mons:  Normal without inguinal adenopathy  External genitalia:  Normal  BUS/Urethra/Skene's glands:  Normal  Bladder:  Normal  Vagina:  Normal  Cervix:  Normal/several lobes  Uterus:  normal in size, shape and contour.  Midline and mobile  Adnexa/parametria:     Rt: Without masses or tenderness.   Lt: Without masses or tenderness.  Anus and perineum: Normal  Digital rectal exam: Normal sphincter tone without palpated masses or  tenderness  Assessment/Plan:  46 y.o. MHF G2 P1 for annual exam with no complaints.  Normal GYN exam on Micronor Migraines-rare imipramine daily  Plan: CBC, glucose, lipid panel, UA, Pap. New Pap screening guidelines reviewed. SBE's, continue annual mammogram, calcium rich diet, vitamin D 1000 daily encouraged and reviewed importance of increasing regular exercise. Micronor daily prescription, proper use, slight risk for clots reviewed and given.    Harrington Challenger [facility], 8:22 AM 01/09/2012

## 2012-01-10 LAB — URINALYSIS W MICROSCOPIC + REFLEX CULTURE
Bilirubin Urine: NEGATIVE
Casts: NONE SEEN
Glucose, UA: NEGATIVE mg/dL
Hgb urine dipstick: NEGATIVE
Ketones, ur: NEGATIVE mg/dL
Protein, ur: NEGATIVE mg/dL
pH: 6 (ref 5.0–8.0)

## 2012-07-18 ENCOUNTER — Other Ambulatory Visit: Payer: Self-pay

## 2012-11-24 ENCOUNTER — Other Ambulatory Visit: Payer: Self-pay | Admitting: Women's Health

## 2012-11-24 DIAGNOSIS — Z1231 Encounter for screening mammogram for malignant neoplasm of breast: Secondary | ICD-10-CM

## 2013-01-04 ENCOUNTER — Encounter: Payer: Self-pay | Admitting: Women's Health

## 2013-01-04 ENCOUNTER — Other Ambulatory Visit: Payer: Self-pay | Admitting: Women's Health

## 2013-01-04 ENCOUNTER — Ambulatory Visit (HOSPITAL_COMMUNITY): Payer: 59

## 2013-01-04 ENCOUNTER — Ambulatory Visit (HOSPITAL_COMMUNITY): Payer: Managed Care, Other (non HMO)

## 2013-01-04 DIAGNOSIS — IMO0001 Reserved for inherently not codable concepts without codable children: Secondary | ICD-10-CM

## 2013-01-04 MED ORDER — NORETHINDRONE 0.35 MG PO TABS: 1.0000 | ORAL_TABLET | Freq: Every day | ORAL | Status: DC

## 2013-01-11 ENCOUNTER — Ambulatory Visit (HOSPITAL_COMMUNITY)
Admission: RE | Admit: 2013-01-11 | Discharge: 2013-01-11 | Disposition: A | Discharge status: Home / Self Care | Payer: 59 | Source: Ambulatory Visit | Attending: Women's Health | Admitting: Women's Health

## 2013-01-11 DIAGNOSIS — Z1231 Encounter for screening mammogram for malignant neoplasm of breast: Secondary | ICD-10-CM

## 2013-01-25 ENCOUNTER — Encounter: Payer: Self-pay | Admitting: Gynecology

## 2013-03-12 ENCOUNTER — Encounter: Payer: Self-pay | Admitting: Women's Health

## 2013-04-02 ENCOUNTER — Ambulatory Visit (INDEPENDENT_AMBULATORY_CARE_PROVIDER_SITE_OTHER): Payer: 59 | Admitting: Women's Health

## 2013-04-02 ENCOUNTER — Encounter: Payer: Self-pay | Admitting: Women's Health

## 2013-04-02 VITALS — BP 116/76 | Ht 60.0 in

## 2013-04-02 DIAGNOSIS — Z01419 Encounter for gynecological examination (general) (routine) without abnormal findings: Secondary | ICD-10-CM

## 2013-04-02 DIAGNOSIS — Z13220 Encounter for screening for lipoid disorders: Secondary | ICD-10-CM

## 2013-04-02 DIAGNOSIS — IMO0001 Reserved for inherently not codable concepts without codable children: Secondary | ICD-10-CM

## 2013-04-02 DIAGNOSIS — Z309 Encounter for contraceptive management, unspecified: Secondary | ICD-10-CM

## 2013-04-02 DIAGNOSIS — Z833 Family history of diabetes mellitus: Secondary | ICD-10-CM

## 2013-04-02 LAB — CBC WITH DIFFERENTIAL/PLATELET
Basophils Absolute: 0 10*3/uL (ref 0.0–0.1)
Eosinophils Absolute: 0 10*3/uL (ref 0.0–0.7)
Eosinophils Relative: 0 % (ref 0–5)
Lymphocytes Relative: 30 % (ref 12–46)
Lymphs Abs: 2 10*3/uL (ref 0.7–4.0)
MCV: 82.6 fL (ref 78.0–100.0)
Neutrophils Relative %: 64 % (ref 43–77)
Platelets: 311 10*3/uL (ref 150–400)
RBC: 5.45 MIL/uL — ABNORMAL HIGH (ref 3.87–5.11)
RDW: 14.6 % (ref 11.5–15.5)
WBC: 6.8 10*3/uL (ref 4.0–10.5)

## 2013-04-02 LAB — LIPID PANEL
HDL: 40 mg/dL (ref >39)
LDL Cholesterol: 136 mg/dL — ABNORMAL HIGH (ref 0–99)

## 2013-04-02 LAB — GLUCOSE, RANDOM: Glucose, Bld: 95 mg/dL (ref 70–99)

## 2013-04-02 MED ORDER — NORETHINDRONE 0.35 MG PO TABS: 1.0000 | ORAL_TABLET | Freq: Every day | ORAL | Status: DC

## 2013-04-02 NOTE — Patient Instructions (Signed)

## 2013-04-02 NOTE — Progress Notes (Signed)
Gabrielle Cross [birth year] [id number]    History:    The patient presents for annual exam.  On Micronor with occasional spotting or several day of regular menses. Normal Pap and mammogram history. Migraines on imipramine with good relief/rare migraines per neurologist.   Past medical history, past surgical history, family history and social history were all reviewed and documented in the EPIC chart. [name] doing well. Father hypercholesteremia.   ROS:  A  ROS was performed and pertinent positives and negatives are included in the history.  Exam:  Filed Vitals:   04/02/13 0825  BP: 116/76    General appearance:  Normal Head/Neck:  Normal, without cervical or supraclavicular adenopathy. Thyroid:  Symmetrical, normal in size, without palpable masses or nodularity. Respiratory  Effort:  Normal  Auscultation:  Clear without wheezing or rhonchi Cardiovascular  Auscultation:  Regular rate, without rubs, murmurs or gallops  Edema/varicosities:  Not grossly evident Abdominal  Soft,nontender, without masses, guarding or rebound.  Liver/spleen:  No organomegaly noted  Hernia:  None appreciated  Skin  Inspection:  Grossly normal  Palpation:  Grossly normal Neurologic/psychiatric  Orientation:  Normal with appropriate conversation.  Mood/affect:  Normal  Genitourinary    Breasts: Examined lying and sitting.     Right: Without masses, retractions, discharge or axillary adenopathy.     Left: Without masses, retractions, discharge or axillary adenopathy.   Inguinal/mons:  Normal without inguinal adenopathy  External genitalia:  Normal  BUS/Urethra/Skene's glands:  Normal  Bladder:  Normal  Vagina:  Normal  Cervix:  Normal  Uterus:  normal in size, shape and contour.  Midline and mobile  Adnexa/parametria:     Rt: Without masses or tenderness.   Lt: Without masses or tenderness.  Anus and perineum: Normal  Digital rectal exam: Normal sphincter tone without palpated masses or  tenderness  Assessment/Plan:  47 y.o.MHF G2P1  for annual exam with no complaints other than become short tempered at times.  Normal GYN exam Migraines improved on mipramine   Plan: Micronor prescription, proper use, slight risk for blood clots and strokes reviewed. SBE's, continue annual mammogram, calcium rich diet, vitamin D 1000 daily encouraged. Denies need for counseling. Reviewed importance of increasing regular exercise for stress relief and health. CBC, glucose, lipid panel, UA, no Pap, Pap normal 2013, new screening guidelines reviewed.    Harrington Challenger [facility], 8:47 AM 04/02/2013

## 2013-04-03 LAB — URINALYSIS W MICROSCOPIC + REFLEX CULTURE
Casts: NONE SEEN
Crystals: NONE SEEN
Glucose, UA: NEGATIVE mg/dL
Nitrite: NEGATIVE
Specific Gravity, Urine: 1.023 (ref 1.005–1.030)
pH: 5.5 (ref 5.0–8.0)

## 2013-04-08 ENCOUNTER — Other Ambulatory Visit: Payer: Self-pay

## 2013-06-22 ENCOUNTER — Encounter: Payer: Self-pay | Admitting: Women's Health

## 2013-06-28 ENCOUNTER — Other Ambulatory Visit: Payer: Self-pay | Admitting: Women's Health

## 2013-06-28 DIAGNOSIS — IMO0001 Reserved for inherently not codable concepts without codable children: Secondary | ICD-10-CM

## 2013-06-28 MED ORDER — NORETHINDRONE 0.35 MG PO TABS: 1.0000 | ORAL_TABLET | Freq: Every day | ORAL | Status: DC

## 2013-09-16 ENCOUNTER — Ambulatory Visit (INDEPENDENT_AMBULATORY_CARE_PROVIDER_SITE_OTHER): Payer: 59 | Admitting: Women's Health

## 2013-09-16 DIAGNOSIS — M25561 Pain in right knee: Secondary | ICD-10-CM

## 2013-09-16 DIAGNOSIS — M25569 Pain in unspecified knee: Secondary | ICD-10-CM

## 2013-09-16 NOTE — Progress Notes (Signed)
Patient ID: Gabrielle Cross, female   DOB: [birth year], 48 y.o.   MRN: [id number] Presents with complaint of right knee pain with swelling for over 2 months.  Denies known injury, no strenuous exercise. Questions  where to go for evaluation.  Exam: Appears well. Right knee inner aspect edematous with tenderness with diffuse edema extending down leg.  No mobility limitations, tenderness with movement.  Right knee edema  Plan: Schedule evaluation at [facility] sports medicine center.

## 2013-10-01 ENCOUNTER — Ambulatory Visit (INDEPENDENT_AMBULATORY_CARE_PROVIDER_SITE_OTHER): Payer: 59 | Admitting: Family Medicine

## 2013-10-01 ENCOUNTER — Encounter: Payer: Self-pay | Admitting: Family Medicine

## 2013-10-01 VITALS — BP 110/74 | Ht 60.0 in | Wt 150.0 lb

## 2013-10-01 DIAGNOSIS — M25569 Pain in unspecified knee: Secondary | ICD-10-CM

## 2013-10-01 NOTE — Progress Notes (Signed)
Patient ID: Gabrielle Cross, female   DOB: [birth year], 48 y.o.   MRN: [id number]    [facility] [address] [place], [place] [address] Phone: [phone] Fax: [phone]   Patient Name: Gabrielle Cross Date of Birth: 09/02/1965 Medical Record Number: [id number] Gender: female Date of Encounter: 10/01/2013  History of Present Illness:  Gabrielle Cross is a 48 y.o. very pleasant female patient who presents with the following:  Right knee pain for 2 months. The patient describes that her pain began when she woke one morning without trauma or other inciting event. She describes that the pain is mostly in her popliteal fossa and at first was mild. After that did not resolve and today she began using Aleve 1 pill twice daily for the following 5 days without much improvement. She stopped taking the Aleve when it did not help.  2 weeks ago the pain worsened with right knee swelling which prompted her to call for appointment here today. She states at that time she had difficulty bending her knee and pain with walking. The pain is still mostly in her right popliteal fossa and she had swelling mostly in the superior lateral segment of her knee.  Since that time her pain has been improving but she still has mild swelling.  She denies locking or popping sensations but does have difficulty climbing stairs because of pain.  Patient Active Problem List   Diagnosis Date Noted  . Migraines 01/09/2012   Past Medical History  Diagnosis Date  . Migraine with aura 2008    AURA  BEGAN 2011  . Ruptured ectopic pregnancy 2002    WITH BLOOD TRANSFUSION 2U'S PRBC'S  . Incompetent cervix     DELIVERED AT 31 WEEKS   Past Surgical History  Procedure Laterality Date  . Cerclage placement    . Salpingectomy      RIGHT, RUPTURED ECTOPIC  . Resectoscopic polypectomy     History  Substance Use Topics  . Smoking status: Never Smoker   . Smokeless tobacco: Never Used  .  Alcohol Use: No   Family History  Problem Relation Age of Onset  . Hyperlipidemia Father    Allergies  Allergen Reactions  . Shellfish Allergy     Medication list has been reviewed and updated.  Prior to Admission medications   Medication Sig Start Date End Date Taking? Authorizing Provider  cholecalciferol (VITAMIN D) 1000 UNITS tablet Take 1,000 Units by mouth daily.    Historical Provider, MD  IMIPRAMINE HCL PO Take by mouth.      Historical Provider, MD  norethindrone (MICRONOR,CAMILA,ERRIN) 0.35 MG tablet Take 1 tablet (0.35 mg total) by mouth daily. 06/28/13   [name], NP    Review of Systems:  No fever, chills, sweats No dyspnea No GI upset, or nausea, vomiting, diarrhea No additional swollen joints  Physical Examination: Filed Vitals:   10/01/13 0838  BP: 110/74   Filed Vitals:   10/01/13 0838  Height: 5' (1.524 m)  Weight: 150 lb (68.04 kg)   Body mass index is 29.3 kg/(m^2).  Gen: NAD, alert, cooperative with exam HEENT: NCAT Neuro: Alert and oriented, No gross deficits, normal gait MSK: Left knee within normal limits,   Right knee: Mild effusion, no bruising, no erythema, ligamentously intact with anterior drawer and varus/valgus stress, questionably positive McMurray sign, mild medial joint line tenderness  Assessment and Plan: 48 year old female with right knee pain most likely due to mild meniscus injury. Discussed treatment options including  conservative treatment with compression, ice, and NSAIDs versus steroid injection. Patient at this time and would like to do conservative treatment and will followup if not improved or if she would like her injection.  Timmothy Euler, MD

## 2013-10-01 NOTE — Patient Instructions (Signed)
It was great to meet you today  Try Ice for up to 15 minutes every 2 hours as needed  Also try the compression brace during the day  Come back in 4 weeks if you have not improved or sooner if you get worse or would like the injection.

## 2013-10-01 NOTE — Progress Notes (Signed)
Patient ID: Gabrielle Cross, female   DOB: 11/10/1965, 48 y.o.   MRN: [id number] [place] Attending Note: I have seen and examined this patient. I have discussed this patient with the resident and reviewed the assessment and plan as documented above. I agree with the resident's findings and plan. ULTRASOUND: Small amount of fluid in the suprapatellar pouch and a very small Baker's cyst. Likely small meniscal injury. Patella tendon, quadricep tendon and visible portion of the menisci are intact although the menisci are surrounded by some fluid which makes the think there is a component of meniscal injury. We discussed options and she chose conservative management with icing, compression with a bio helix sleeve and gradually return to activity. NSAIDs when necessary with attention paid to side effects such as GI upset. Should she not improve, would be happy to see her back.

## 2013-10-03 ENCOUNTER — Encounter: Payer: Self-pay | Admitting: Family Medicine

## 2014-03-15 ENCOUNTER — Encounter: Payer: Self-pay | Admitting: Women's Health

## 2014-03-15 ENCOUNTER — Other Ambulatory Visit: Payer: Self-pay | Admitting: Women's Health

## 2014-03-15 MED ORDER — CLINDAMYCIN PHOS-BENZOYL PEROX 1-5 % EX GEL
Freq: Two times a day (BID) | CUTANEOUS | Status: DC
Start: 2014-03-15 — End: 2014-04-21

## 2014-04-04 ENCOUNTER — Encounter: Payer: Self-pay | Admitting: Family Medicine

## 2014-04-21 ENCOUNTER — Encounter: Payer: Self-pay | Admitting: Women's Health

## 2014-04-21 ENCOUNTER — Ambulatory Visit (INDEPENDENT_AMBULATORY_CARE_PROVIDER_SITE_OTHER): Payer: 59 | Admitting: Women's Health

## 2014-04-21 VITALS — BP 118/80 | Ht 61.0 in | Wt 150.0 lb

## 2014-04-21 DIAGNOSIS — Z01419 Encounter for gynecological examination (general) (routine) without abnormal findings: Secondary | ICD-10-CM

## 2014-04-21 DIAGNOSIS — Z833 Family history of diabetes mellitus: Secondary | ICD-10-CM

## 2014-04-21 DIAGNOSIS — Z3040 Encounter for surveillance of contraceptives, unspecified: Secondary | ICD-10-CM

## 2014-04-21 DIAGNOSIS — Z13220 Encounter for screening for lipoid disorders: Secondary | ICD-10-CM

## 2014-04-21 LAB — LIPID PANEL
CHOLESTEROL: 194 mg/dL (ref 0–200)
HDL: 34 mg/dL — ABNORMAL LOW (ref >39)
LDL Cholesterol: 136 mg/dL — ABNORMAL HIGH (ref 0–99)
Total CHOL/HDL Ratio: 5.7 Ratio
Triglycerides: 121 mg/dL (ref <150)
VLDL: 24 mg/dL (ref 0–40)

## 2014-04-21 LAB — CBC WITH DIFFERENTIAL/PLATELET
BASOS ABS: 0 10*3/uL (ref 0.0–0.1)
BASOS PCT: 0 % (ref 0–1)
EOS ABS: 0 10*3/uL (ref 0.0–0.7)
Eosinophils Relative: 0 % (ref 0–5)
HCT: 44.5 % (ref 36.0–46.0)
Hemoglobin: 15.1 g/dL — ABNORMAL HIGH (ref 12.0–15.0)
Lymphocytes Relative: 36 % (ref 12–46)
Lymphs Abs: 2.3 10*3/uL (ref 0.7–4.0)
MCH: 28.3 pg (ref 26.0–34.0)
MCHC: 33.9 g/dL (ref 30.0–36.0)
MCV: 83.5 fL (ref 78.0–100.0)
MPV: 9.7 fL (ref 9.4–12.4)
Monocytes Absolute: 0.4 10*3/uL (ref 0.1–1.0)
Monocytes Relative: 6 % (ref 3–12)
NEUTROS ABS: 3.8 10*3/uL (ref 1.7–7.7)
NEUTROS PCT: 58 % (ref 43–77)
PLATELETS: 363 10*3/uL (ref 150–400)
RBC: 5.33 MIL/uL — ABNORMAL HIGH (ref 3.87–5.11)
RDW: 14.4 % (ref 11.5–15.5)
WBC: 6.5 10*3/uL (ref 4.0–10.5)

## 2014-04-21 LAB — GLUCOSE, RANDOM: GLUCOSE: 97 mg/dL (ref 70–99)

## 2014-04-21 MED ORDER — NORETHINDRONE 0.35 MG PO TABS: 1.0000 | ORAL_TABLET | Freq: Every day | ORAL | Status: DC

## 2014-04-21 NOTE — Patient Instructions (Signed)

## 2014-04-21 NOTE — Addendum Note (Signed)
Addended by: Joaquin Music on: 04/21/2014 11:15 AM   Modules accepted: Orders

## 2014-04-21 NOTE — Progress Notes (Signed)
Gabrielle Cross March 25, 1966 [id number]    History:    Presents for annual exam.  Light spotting or amenorrhea with Micronor. History of migraines with aura, mostly migraine free, currently on daily imipramine for prevention. Normal Pap and mammogram history.  Past medical history, past surgical history, family history and social history were all reviewed and documented in the EPIC chart. Father hypercholesterolemia, mother healthy. [name] doing well.  ROS:  A  12 point ROS was performed and pertinent positives and negatives are included.  Exam:  Filed Vitals:   04/21/14 0850  BP: 118/80    General appearance:  Normal Thyroid:  Symmetrical, normal in size, without palpable masses or nodularity. Respiratory  Auscultation:  Clear without wheezing or rhonchi Cardiovascular  Auscultation:  Regular rate, without rubs, murmurs or gallops  Edema/varicosities:  Not grossly evident Abdominal  Soft,nontender, without masses, guarding or rebound.  Liver/spleen:  No organomegaly noted  Hernia:  None appreciated  Skin  Inspection:  Grossly normal   Breasts: Examined lying and sitting.     Right: Without masses, retractions, discharge or axillary adenopathy.     Left: Without masses, retractions, discharge or axillary adenopathy. Gentitourinary   Inguinal/mons:  Normal without inguinal adenopathy  External genitalia:  Normal  BUS/Urethra/Skene's glands:  Normal  Vagina:  Normal  Cervix:  Normal  Uterus:  normal in size, shape and contour.  Midline and mobile  Adnexa/parametria:     Rt: Without masses or tenderness.   Lt: Without masses or tenderness.  Anus and perineum: Normal  Digital rectal exam: Normal sphincter tone without palpated masses or tenderness  Assessment/Plan:  48 y.o. MHF G2P27for annual exam.   Spotting or amenorrhea on Micronor Migraines with aura on imipramine per neurologist  Plan: Micronor prescription, proper use, slight risk for blood clots reviewed. SBE's,  annual mammogram, instructed to schedule, calcium rich diet, vitamin D 1000 daily encouraged. Increase regular exercise and decrease calories for weight loss encouraged. CBC, lipid panel, glucose, UA, Pap normal with negative HR HPV 2013, new screening guidelines reviewed.    [name] [facility], 10:31 AM 04/21/2014

## 2015-01-04 ENCOUNTER — Other Ambulatory Visit: Payer: Self-pay | Admitting: Women's Health

## 2015-01-04 DIAGNOSIS — Z1231 Encounter for screening mammogram for malignant neoplasm of breast: Secondary | ICD-10-CM

## 2015-01-13 ENCOUNTER — Ambulatory Visit (HOSPITAL_COMMUNITY)
Admission: RE | Admit: 2015-01-13 | Discharge: 2015-01-13 | Disposition: A | Discharge status: Home / Self Care | Payer: 59 | Source: Ambulatory Visit | Attending: Women's Health | Admitting: Women's Health

## 2015-01-13 DIAGNOSIS — Z1231 Encounter for screening mammogram for malignant neoplasm of breast: Secondary | ICD-10-CM | POA: Insufficient documentation

## 2015-01-16 ENCOUNTER — Encounter: Payer: Self-pay | Admitting: Women's Health

## 2015-03-24 ENCOUNTER — Encounter: Payer: Self-pay | Admitting: Women's Health

## 2015-05-03 ENCOUNTER — Other Ambulatory Visit: Payer: Self-pay | Admitting: *Deleted

## 2015-05-03 DIAGNOSIS — Z3040 Encounter for surveillance of contraceptives, unspecified: Secondary | ICD-10-CM

## 2015-05-03 MED ORDER — NORETHINDRONE 0.35 MG PO TABS: 1.0000 | ORAL_TABLET | Freq: Every day | ORAL | Status: DC

## 2015-06-20 ENCOUNTER — Telehealth: Payer: Self-pay | Admitting: *Deleted

## 2015-06-20 DIAGNOSIS — G43909 Migraine, unspecified, not intractable, without status migrainosus: Secondary | ICD-10-CM

## 2015-06-20 DIAGNOSIS — G43019 Migraine without aura, intractable, without status migrainosus: Secondary | ICD-10-CM | POA: Diagnosis not present

## 2015-06-20 DIAGNOSIS — Z13220 Encounter for screening for lipoid disorders: Secondary | ICD-10-CM

## 2015-06-20 NOTE — Telephone Encounter (Signed)
Pt saw her Neurologist and he wanted lab done, per nancy verbal okay to have this done. Orders placed, pt will schedule lab appointment

## 2015-06-21 ENCOUNTER — Other Ambulatory Visit: Payer: Self-pay | Admitting: Women's Health

## 2015-06-26 ENCOUNTER — Other Ambulatory Visit: Payer: 59

## 2015-06-26 DIAGNOSIS — Z13220 Encounter for screening for lipoid disorders: Secondary | ICD-10-CM | POA: Diagnosis not present

## 2015-06-26 DIAGNOSIS — G43909 Migraine, unspecified, not intractable, without status migrainosus: Secondary | ICD-10-CM | POA: Diagnosis not present

## 2015-06-26 LAB — HEMATOCRIT: HEMATOCRIT: 46 % (ref 36.0–46.0)

## 2015-06-26 LAB — BASIC METABOLIC PANEL
BUN: 15 mg/dL (ref 7–25)
CO2: 24 mmol/L (ref 20–31)
Calcium: 9.2 mg/dL (ref 8.6–10.2)
Chloride: 103 mmol/L (ref 98–110)
Creat: 0.76 mg/dL (ref 0.50–1.10)
GLUCOSE: 89 mg/dL (ref 65–99)
Potassium: 4.4 mmol/L (ref 3.5–5.3)
SODIUM: 138 mmol/L (ref 135–146)

## 2015-06-26 LAB — LIPID PANEL
Cholesterol: 202 mg/dL — ABNORMAL HIGH (ref 125–200)
HDL: 39 mg/dL — AB (ref >=46)
LDL Cholesterol: 145 mg/dL — ABNORMAL HIGH (ref <130)
Total CHOL/HDL Ratio: 5.2 Ratio — ABNORMAL HIGH (ref <=5.0)
Triglycerides: 88 mg/dL (ref <150)
VLDL: 18 mg/dL (ref <30)

## 2015-06-26 LAB — ALT: ALT: 14 U/L (ref 6–29)

## 2015-06-26 LAB — AST: AST: 12 U/L (ref 10–35)

## 2015-06-26 LAB — SEDIMENTATION RATE: SED RATE: 6 mm/h (ref 0–20)

## 2015-06-26 LAB — HEMOGLOBIN: HEMOGLOBIN: 15.5 g/dL — AB (ref 12.0–15.0)

## 2015-06-27 ENCOUNTER — Encounter: Payer: Self-pay | Admitting: Women's Health

## 2015-08-30 ENCOUNTER — Ambulatory Visit (INDEPENDENT_AMBULATORY_CARE_PROVIDER_SITE_OTHER): Payer: 59 | Admitting: Women's Health

## 2015-08-30 ENCOUNTER — Encounter: Payer: Self-pay | Admitting: Women's Health

## 2015-08-30 VITALS — BP 126/78 | Ht 61.0 in

## 2015-08-30 DIAGNOSIS — Z3041 Encounter for surveillance of contraceptive pills: Secondary | ICD-10-CM | POA: Diagnosis not present

## 2015-08-30 DIAGNOSIS — Z01419 Encounter for gynecological examination (general) (routine) without abnormal findings: Secondary | ICD-10-CM

## 2015-08-30 MED ORDER — NORETHINDRONE 0.35 MG PO TABS: ORAL_TABLET | ORAL | Status: DC

## 2015-08-30 NOTE — Progress Notes (Signed)
Gabrielle Cross [birth year] [id number]    History:    Presents for annual exam.  Amenorrheic or rare brown spotting on Micronor. History migraines with aura neurologist manages. Normal Pap and mammogram history. History of a right ectopic with salpingectomy.  Past medical history, past surgical history, family history and social history were all reviewed and documented in the EPIC chart. 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 Father hypercholesterolemia. Gabrielle Cross 15 doing well.  ROS:  A ROS was performed and pertinent positives and negatives are included.  Exam:  Filed Vitals:   08/30/15 0811  BP: 126/78    General appearance:  Normal Thyroid:  Symmetrical, normal in size, without palpable masses or nodularity. Respiratory  Auscultation:  Clear without wheezing or rhonchi Cardiovascular  Auscultation:  Regular rate, without rubs, murmurs or gallops  Edema/varicosities:  Not grossly evident Abdominal  Soft,nontender, without masses, guarding or rebound.  Liver/spleen:  No organomegaly noted  Hernia:  None appreciated  Skin  Inspection:  Grossly normal   Breasts: Examined lying and sitting.     Right: Without masses, retractions, discharge or axillary adenopathy.     Left: Without masses, retractions, discharge or axillary adenopathy. Gentitourinary   Inguinal/mons:  Normal without inguinal adenopathy  External genitalia:  Normal  BUS/Urethra/Skene's glands:  Normal  Vagina:  Normal  Cervix:  Normal  Uterus:  normal in size, shape and contour.  Midline and mobile  Adnexa/parametria:     Rt: Without masses or tenderness.   Lt: Without masses or tenderness.  Anus and perineum: Normal  Digital rectal exam: Normal sphincter tone without palpated masses or tenderness  Assessment/Plan:  50 y.o. MHF G2 P1  for annual exam with no complaints.  Amenorrheic on Micronor Migraines with aura-neurologist manages doing better on daily imipramine  Plan: Micronor prescription, proper  use given and reviewed slight risks for blood clots. SBE's, continue annual 3-D mammogram. Reviewed importance of increasing regular exercise, decreasing calories for weight loss, less than 20 g saturated fat daily, HDL low at 39, LDL elevated. Red rice yeast supplement encouraged. UA, Pap with HR HPV typing, new screening guidelines reviewed.  Gabrielle Cross, 8:49 AM 08/30/2015

## 2015-08-30 NOTE — Patient Instructions (Signed)
Health Maintenance, Female Adopting a healthy lifestyle and getting preventive care can go a long way to promote health and wellness. Talk with your health care provider about what schedule of regular examinations is right for you. This is a good chance for you to check in with your provider about disease prevention and staying healthy. In between checkups, there are plenty of things you can do on your own. Experts have done a lot of research about which lifestyle changes and preventive measures are most likely to keep you healthy. Ask your health care provider for more information. WEIGHT AND DIET  Eat a healthy diet  Be sure to include plenty of vegetables, fruits, low-fat dairy products, and lean protein.  Do not eat a lot of foods high in solid fats, added sugars, or salt.  Get regular exercise. This is one of the most important things you can do for your health.  Most adults should exercise for at least 150 minutes each week. The exercise should increase your heart rate and make you sweat (moderate-intensity exercise).  Most adults should also do strengthening exercises at least twice a week. This is in addition to the moderate-intensity exercise.  Maintain a healthy weight  Body mass index (BMI) is a measurement that can be used to identify possible weight problems. It estimates body fat based on height and weight. Your health care provider can help determine your BMI and help you achieve or maintain a healthy weight.  For females 20 years of age and older:   A BMI below 18.5 is considered underweight.  A BMI of 18.5 to 24.9 is normal.  A BMI of 25 to 29.9 is considered overweight.  A BMI of 30 and above is considered obese.  Watch levels of cholesterol and blood lipids  You should start having your blood tested for lipids and cholesterol at 48 years of age, then have this test every 5 years.  You may need to have your cholesterol levels checked more often if:  Your lipid  or cholesterol levels are high.  You are older than 48 years of age.  You are at high risk for heart disease.  CANCER SCREENING   Lung Cancer  Lung cancer screening is recommended for adults 55-80 years old who are at high risk for lung cancer because of a history of smoking.  A yearly low-dose CT scan of the lungs is recommended for people who:  Currently smoke.  Have quit within the past 15 years.  Have at least a 30-pack-year history of smoking. A pack year is smoking an average of one pack of cigarettes a day for 1 year.  Yearly screening should continue until it has been 15 years since you quit.  Yearly screening should stop if you develop a health problem that would prevent you from having lung cancer treatment.  Breast Cancer  Practice breast self-awareness. This means understanding how your breasts normally appear and feel.  It also means doing regular breast self-exams. Let your health care provider know about any changes, no matter how small.  If you are in your 20s or 30s, you should have a clinical breast exam (CBE) by a health care provider every 1-3 years as part of a regular health exam.  If you are 40 or older, have a CBE every year. Also consider having a breast X-ray (mammogram) every year.  If you have a family history of breast cancer, talk to your health care provider about genetic screening.  If you   are at high risk for breast cancer, talk to your health care provider about having an MRI and a mammogram every year.  Breast cancer gene (BRCA) assessment is recommended for women who have family members with BRCA-related cancers. BRCA-related cancers include:  Breast.  Ovarian.  Tubal.  Peritoneal cancers.  Results of the assessment will determine the need for genetic counseling and BRCA1 and BRCA2 testing. Cervical Cancer Your health care provider may recommend that you be screened regularly for cancer of the pelvic organs (ovaries, uterus, and  vagina). This screening involves a pelvic examination, including checking for microscopic changes to the surface of your cervix (Pap test). You may be encouraged to have this screening done every 3 years, beginning at age 21.  For women ages 30-65, health care providers may recommend pelvic exams and Pap testing every 3 years, or they may recommend the Pap and pelvic exam, combined with testing for human papilloma virus (HPV), every 5 years. Some types of HPV increase your risk of cervical cancer. Testing for HPV may also be done on women of any age with unclear Pap test results.  Other health care providers may not recommend any screening for nonpregnant women who are considered low risk for pelvic cancer and who do not have symptoms. Ask your health care provider if a screening pelvic exam is right for you.  If you have had past treatment for cervical cancer or a condition that could lead to cancer, you need Pap tests and screening for cancer for at least 20 years after your treatment. If Pap tests have been discontinued, your risk factors (such as having a new sexual partner) need to be reassessed to determine if screening should resume. Some women have medical problems that increase the chance of getting cervical cancer. In these cases, your health care provider may recommend more frequent screening and Pap tests. Colorectal Cancer  This type of cancer can be detected and often prevented.  Routine colorectal cancer screening usually begins at 48 years of age and continues through 48 years of age.  Your health care provider may recommend screening at an earlier age if you have risk factors for colon cancer.  Your health care provider may also recommend using home test kits to check for hidden blood in the stool.  A small camera at the end of a tube can be used to examine your colon directly (sigmoidoscopy or colonoscopy). This is done to check for the earliest forms of colorectal  cancer.  Routine screening usually begins at age 50.  Direct examination of the colon should be repeated every 5-10 years through 48 years of age. However, you may need to be screened more often if early forms of precancerous polyps or small growths are found. Skin Cancer  Check your skin from head to toe regularly.  Tell your health care provider about any new moles or changes in moles, especially if there is a change in a mole's shape or color.  Also tell your health care provider if you have a mole that is larger than the size of a pencil eraser.  Always use sunscreen. Apply sunscreen liberally and repeatedly throughout the day.  Protect yourself by wearing long sleeves, pants, a wide-brimmed hat, and sunglasses whenever you are outside. HEART DISEASE, DIABETES, AND HIGH BLOOD PRESSURE   High blood pressure causes heart disease and increases the risk of stroke. High blood pressure is more likely to develop in:  People who have blood pressure in the high end   of the normal range (130-139/85-89 mm Hg).  People who are overweight or obese.  People who are African American.  If you are 38-23 years of age, have your blood pressure checked every 3-5 years. If you are 61 years of age or older, have your blood pressure checked every year. You should have your blood pressure measured twice--once when you are at a hospital or clinic, and once when you are not at a hospital or clinic. Record the average of the two measurements. To check your blood pressure when you are not at a hospital or clinic, you can use:  An automated blood pressure machine at a pharmacy.  A home blood pressure monitor.  If you are between 45 years and 39 years old, ask your health care provider if you should take aspirin to prevent strokes.  Have regular diabetes screenings. This involves taking a blood sample to check your fasting blood sugar level.  If you are at a normal weight and have a low risk for diabetes,  have this test once every three years after 48 years of age.  If you are overweight and have a high risk for diabetes, consider being tested at a younger age or more often. PREVENTING INFECTION  Hepatitis B  If you have a higher risk for hepatitis B, you should be screened for this virus. You are considered at high risk for hepatitis B if:  You were born in a country where hepatitis B is common. Ask your health care provider which countries are considered high risk.  Your parents were born in a high-risk country, and you have not been immunized against hepatitis B (hepatitis B vaccine).  You have HIV or AIDS.  You use needles to inject street drugs.  You live with someone who has hepatitis B.  You have had sex with someone who has hepatitis B.  You get hemodialysis treatment.  You take certain medicines for conditions, including cancer, organ transplantation, and autoimmune conditions. Hepatitis C  Blood testing is recommended for:  Everyone born from [birth year] through 1963.  Anyone with known risk factors for hepatitis C. Sexually transmitted infections (STIs)  You should be screened for sexually transmitted infections (STIs) including gonorrhea and chlamydia if:  You are sexually active and are younger than 48 years of age.  You are older than 48 years of age and your health care provider tells you that you are at risk for this type of infection.  Your sexual activity has changed since you were last screened and you are at an increased risk for chlamydia or gonorrhea. Ask your health care provider if you are at risk.  If you do not have HIV, but are at risk, it may be recommended that you take a prescription medicine daily to prevent HIV infection. This is called pre-exposure prophylaxis (PrEP). You are considered at risk if:  You are sexually active and do not regularly use condoms or know the HIV status of your partner(s).  You take drugs by injection.  You are sexually  active with a partner who has HIV. Talk with your health care provider about whether you are at high risk of being infected with HIV. If you choose to begin PrEP, you should first be tested for HIV. You should then be tested every 3 months for as long as you are taking PrEP.  PREGNANCY   If you are premenopausal and you may become pregnant, ask your health care provider about preconception counseling.  If you may  become pregnant, take 400 to 800 micrograms (mcg) of folic acid every day.  If you want to prevent pregnancy, talk to your health care provider about birth control (contraception). OSTEOPOROSIS AND MENOPAUSE   Osteoporosis is a disease in which the bones lose minerals and strength with aging. This can result in serious bone fractures. Your risk for osteoporosis can be identified using a bone density scan.  If you are 61 years of age or older, or if you are at risk for osteoporosis and fractures, ask your health care provider if you should be screened.  Ask your health care provider whether you should take a calcium or vitamin D supplement to lower your risk for osteoporosis.  Menopause may have certain physical symptoms and risks.  Hormone replacement therapy may reduce some of these symptoms and risks. Talk to your health care provider about whether hormone replacement therapy is right for you.  HOME CARE INSTRUCTIONS   Schedule regular health, dental, and eye exams.  Stay current with your immunizations.   Do not use any tobacco products including cigarettes, chewing tobacco, or electronic cigarettes.  If you are pregnant, do not drink alcohol.  If you are breastfeeding, limit how much and how often you drink alcohol.  Limit alcohol intake to no more than 1 drink per day for nonpregnant women. One drink equals 12 ounces of beer, 5 ounces of wine, or 1 ounces of hard liquor.  Do not use street drugs.  Do not share needles.  Ask your health care provider for help if  you need support or information about quitting drugs.  Tell your health care provider if you often feel depressed.  Tell your health care provider if you have ever been abused or do not feel safe at home.   This information is not intended to replace advice given to you by your health care provider. Make sure you discuss any questions you have with your health care provider.   Document Released: 12/03/2010 Document Revised: 06/10/2014 Document Reviewed: 04/21/2013 Elsevier Interactive Patient Education [employer].

## 2015-08-30 NOTE — Addendum Note (Signed)
Addended by: Burnett Kanaris on: 08/30/2015 10:29 AM   Modules accepted: Orders

## 2015-08-30 NOTE — Addendum Note (Signed)
Addended by: Joaquin Music on: 08/30/2015 08:56 AM   Modules accepted: Orders

## 2015-09-01 LAB — PAP, TP IMAGING W/ HPV RNA, RFLX HPV TYPE 16,18/45: HPV MRNA, HIGH RISK: NOT DETECTED

## 2015-09-03 ENCOUNTER — Encounter: Payer: Self-pay | Admitting: Women's Health

## 2015-10-24 DIAGNOSIS — D485 Neoplasm of uncertain behavior of skin: Secondary | ICD-10-CM | POA: Diagnosis not present

## 2015-10-24 DIAGNOSIS — L820 Inflamed seborrheic keratosis: Secondary | ICD-10-CM | POA: Diagnosis not present

## 2015-10-24 DIAGNOSIS — D225 Melanocytic nevi of trunk: Secondary | ICD-10-CM | POA: Diagnosis not present

## 2015-10-24 DIAGNOSIS — L821 Other seborrheic keratosis: Secondary | ICD-10-CM | POA: Diagnosis not present

## 2016-01-02 DIAGNOSIS — G43019 Migraine without aura, intractable, without status migrainosus: Secondary | ICD-10-CM | POA: Diagnosis not present

## 2016-01-08 ENCOUNTER — Encounter: Payer: Self-pay | Admitting: Gynecology

## 2016-01-08 ENCOUNTER — Ambulatory Visit (INDEPENDENT_AMBULATORY_CARE_PROVIDER_SITE_OTHER): Payer: 59 | Admitting: Gynecology

## 2016-01-08 VITALS — BP 120/76

## 2016-01-08 DIAGNOSIS — L989 Disorder of the skin and subcutaneous tissue, unspecified: Secondary | ICD-10-CM

## 2016-01-08 DIAGNOSIS — L820 Inflamed seborrheic keratosis: Secondary | ICD-10-CM | POA: Diagnosis not present

## 2016-01-08 NOTE — Patient Instructions (Signed)
Office will call you with biopsy results 

## 2016-01-08 NOTE — Progress Notes (Signed)
    Gabrielle Cross Apr 16, 1966 [id number]        50 y.o.  G2P0111 presents with raised skin lesion right posterior shoulder in the bra line that is bothersome to the patient. Patient requests to have excised.  Past medical history,surgical history, problem list, medications, allergies, family history and social history were all reviewed and documented in the EPIC chart.  Directed ROS with pertinent positives and negatives documented in the history of present illness/assessment and plan.  Exam: [name] assistant Vitals:   01/08/16 1012  BP: 120/76   General appearance:  Normal Raised domelike dark red skin lesion right posterior shoulder. 6-7 mm in diameter.  Procedure: The skin overlying and surrounding the skin lesion was cleansed with Hibiclens and subsequently infiltrated with 1% lidocaine. The skin lesion was excised in its entirety using an elliptical incision and sent to pathology. The incisional defect was closed using 4-0 Vicryl in interrupted cutaneous stitch. Pressure dressing applied afterwards. Postoperative instructions given.  Assessment/Plan:  50 y.o. [name] with excision of skin lesion as noted above. Patient will follow up for biopsy results in several days.    Anastasio Auerbach MD, 10:33 AM 01/08/2016

## 2016-01-09 LAB — PATHOLOGY

## 2016-04-03 ENCOUNTER — Other Ambulatory Visit: Payer: Self-pay | Admitting: Women's Health

## 2016-04-03 DIAGNOSIS — Z1231 Encounter for screening mammogram for malignant neoplasm of breast: Secondary | ICD-10-CM

## 2016-05-03 ENCOUNTER — Ambulatory Visit
Admission: RE | Admit: 2016-05-03 | Discharge: 2016-05-03 | Disposition: A | Discharge status: Home / Self Care | Payer: 59 | Source: Ambulatory Visit | Attending: Women's Health | Admitting: Women's Health

## 2016-05-03 DIAGNOSIS — H52221 Regular astigmatism, right eye: Secondary | ICD-10-CM | POA: Diagnosis not present

## 2016-05-03 DIAGNOSIS — H5201 Hypermetropia, right eye: Secondary | ICD-10-CM | POA: Diagnosis not present

## 2016-05-03 DIAGNOSIS — Z1231 Encounter for screening mammogram for malignant neoplasm of breast: Secondary | ICD-10-CM | POA: Diagnosis not present

## 2016-05-03 DIAGNOSIS — H524 Presbyopia: Secondary | ICD-10-CM | POA: Diagnosis not present

## 2016-05-07 ENCOUNTER — Encounter: Payer: Self-pay | Admitting: Women's Health

## 2016-07-12 ENCOUNTER — Telehealth: Payer: Self-pay

## 2016-07-12 ENCOUNTER — Other Ambulatory Visit: Payer: Self-pay | Admitting: Women's Health

## 2016-07-12 ENCOUNTER — Other Ambulatory Visit: Payer: 59

## 2016-07-12 DIAGNOSIS — N951 Menopausal and female climacteric states: Secondary | ICD-10-CM

## 2016-07-12 NOTE — Telephone Encounter (Signed)
Patient informed. Order placed and lab appt scheduled.

## 2016-07-12 NOTE — Telephone Encounter (Signed)
Can check an [place]. Regular exercise can help prevent moodiness and irritability!

## 2016-07-12 NOTE — Telephone Encounter (Signed)
On Oc's and does not have monthly withdrawal with them. Almost 51 yo and questions "how will I know when I am in menopause?"  Irritability is a big problem. "I am always in a bad mood".    Feels hot a lot. At night will feel hot and throw off covers.

## 2016-07-13 LAB — FOLLICLE STIMULATING HORMONE: FSH: 3.2 m[IU]/mL

## 2016-07-14 ENCOUNTER — Encounter: Payer: Self-pay | Admitting: Women's Health

## 2016-09-17 ENCOUNTER — Telehealth: Payer: Self-pay | Admitting: *Deleted

## 2016-09-17 DIAGNOSIS — Z3041 Encounter for surveillance of contraceptive pills: Secondary | ICD-10-CM

## 2016-09-17 MED ORDER — NORETHINDRONE 0.35 MG PO TABS: ORAL_TABLET | ORAL | 0 refills | Status: DC

## 2016-09-17 NOTE — Telephone Encounter (Signed)
Pt has new mail order pharmacy, needs refill on birth control pills sent to new pharmacy. Rx sent pt aware.

## 2016-10-01 DIAGNOSIS — G43019 Migraine without aura, intractable, without status migrainosus: Secondary | ICD-10-CM | POA: Diagnosis not present

## 2016-10-16 ENCOUNTER — Encounter: Payer: Self-pay | Admitting: Gynecology

## 2016-12-10 ENCOUNTER — Telehealth: Payer: Self-pay | Admitting: *Deleted

## 2016-12-10 DIAGNOSIS — Z3041 Encounter for surveillance of contraceptive pills: Secondary | ICD-10-CM

## 2016-12-10 NOTE — Telephone Encounter (Signed)
Pt called request refill on birth control, pt said she will schedule annual exam in fall, okay to refill?

## 2016-12-15 NOTE — Telephone Encounter (Signed)
Yes please refill but have her schedule for fall.

## 2016-12-16 MED ORDER — NORETHINDRONE 0.35 MG PO TABS: ORAL_TABLET | ORAL | 0 refills | Status: DC

## 2016-12-17 NOTE — Telephone Encounter (Signed)
Rx sent 

## 2017-03-04 ENCOUNTER — Other Ambulatory Visit: Payer: Self-pay | Admitting: *Deleted

## 2017-03-04 ENCOUNTER — Ambulatory Visit (INDEPENDENT_AMBULATORY_CARE_PROVIDER_SITE_OTHER): Payer: 59 | Admitting: Women's Health

## 2017-03-04 ENCOUNTER — Encounter: Payer: Self-pay | Admitting: Women's Health

## 2017-03-04 VITALS — BP 122/78 | Ht 61.0 in

## 2017-03-04 DIAGNOSIS — Z13220 Encounter for screening for lipoid disorders: Secondary | ICD-10-CM

## 2017-03-04 DIAGNOSIS — Z01419 Encounter for gynecological examination (general) (routine) without abnormal findings: Secondary | ICD-10-CM

## 2017-03-04 DIAGNOSIS — Z3041 Encounter for surveillance of contraceptive pills: Secondary | ICD-10-CM | POA: Diagnosis not present

## 2017-03-04 DIAGNOSIS — E559 Vitamin D deficiency, unspecified: Secondary | ICD-10-CM | POA: Diagnosis not present

## 2017-03-04 MED ORDER — NORETHINDRONE 0.35 MG PO TABS: ORAL_TABLET | ORAL | 4 refills | Status: DC

## 2017-03-04 NOTE — Progress Notes (Signed)
Gabrielle Cross 1965-09-28 [id number]    History:    Presents for annual exam.  Rare spotting on Micronor with no menopausal symptoms. Normal Pap and mammogram history. Slightly elevated cholesterol. History with migraines with aura neurologist manages.  Past medical history, past surgical history, family history and social history were all reviewed and documented in the EPIC chart. 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 [name] 14 doing well. Father hypercholesterolemia, mother healthy. History of ruptured ectopic.  ROS:  A ROS was performed and pertinent positives and negatives are included.  Exam:  Vitals:   03/04/17 0804  BP: 122/78  Height: 5\' 1"  (1.549 m)   There is no height or weight on file to calculate BMI.   General appearance:  Normal Thyroid:  Symmetrical, normal in size, without palpable masses or nodularity. Respiratory  Auscultation:  Clear without wheezing or rhonchi Cardiovascular  Auscultation:  Regular rate, without rubs, murmurs or gallops  Edema/varicosities:  Not grossly evident Abdominal  Soft,nontender, without masses, guarding or rebound.  Liver/spleen:  No organomegaly noted  Hernia:  None appreciated  Skin  Inspection:  Grossly normal   Breasts: Examined lying and sitting.     Right: Without masses, retractions, discharge or axillary adenopathy.     Left: Without masses, retractions, discharge or axillary adenopathy. Gentitourinary   Inguinal/mons:  Normal without inguinal adenopathy  External genitalia:  Normal  BUS/Urethra/Skene's glands:  Normal  Vagina:  Normal  Cervix:  Normal  Uterus:   normal in size, shape and contour.  Midline and mobile  Adnexa/parametria:     Rt: Without masses or tenderness.   Lt: Without masses or tenderness.  Anus and perineum: Normal  Digital rectal exam: Normal sphincter tone without palpated masses or tenderness  Assessment/Plan:  51 y.o. mph F G2 P1 for annual exam with no complaints.  Rare spotting on  Micronor Migraines with aura-neurologist manages Overweight  Plan: Micronor prescription, proper use, slight risk for blood clots reviewed.  SBE's, continue annual screening mammogram due in December. Reviewed importance of increasing exercise, decreasing calories, weightbearing exercises and vitamin D 1000 daily encouraged. Normal Pap 2017, new screening guidelines reviewed. CBC, CMP, lipid panel, vitamin D.  [place], 8:21 AM 03/04/2017

## 2017-03-04 NOTE — Patient Instructions (Signed)

## 2017-04-04 MED FILL — IMIPRAMINE HCL 50 MG TABLET: 50 | 30 days supply | Qty: 30 | Fill #0

## 2017-05-06 DIAGNOSIS — Z79899 Other long term (current) drug therapy: Secondary | ICD-10-CM | POA: Diagnosis not present

## 2017-05-06 DIAGNOSIS — G43019 Migraine without aura, intractable, without status migrainosus: Secondary | ICD-10-CM | POA: Diagnosis not present

## 2017-05-06 DIAGNOSIS — R51 Headache: Secondary | ICD-10-CM | POA: Diagnosis not present

## 2017-07-08 MED FILL — DICLOFENAC SOD EC 50 MG TAB: 50 | 3 days supply | Qty: 6 | Fill #0

## 2017-08-11 ENCOUNTER — Telehealth: Payer: Self-pay | Admitting: *Deleted

## 2017-08-11 MED ORDER — NORETHINDRONE 0.35 MG PO TABS: 1.0000 | ORAL_TABLET | Freq: Every day | ORAL | 0 refills | Status: DC

## 2017-08-11 MED FILL — NORETHINDRONE 0.35 MG TAB: 0.35 | 28 days supply | Qty: 28 | Fill #0

## 2017-08-11 NOTE — Telephone Encounter (Signed)
Pt called requesting 1 pack of birth control pills sent to local pharmacy. Rx sent.

## 2017-09-01 MED FILL — IMIPRAMINE HCL 50 MG TABLET: 50 | 60 days supply | Qty: 60 | Fill #0

## 2017-09-04 ENCOUNTER — Other Ambulatory Visit: Payer: Self-pay | Admitting: Women's Health

## 2017-09-04 DIAGNOSIS — Z1231 Encounter for screening mammogram for malignant neoplasm of breast: Secondary | ICD-10-CM

## 2017-09-26 ENCOUNTER — Ambulatory Visit: Payer: 59

## 2017-10-02 MED FILL — NORETHINDRONE 0.35 MG TAB: 0.35 | 84 days supply | Qty: 84 | Fill #0

## 2017-11-03 DIAGNOSIS — G43019 Migraine without aura, intractable, without status migrainosus: Secondary | ICD-10-CM | POA: Diagnosis not present

## 2017-11-11 MED FILL — IMIPRAMINE HCL 50 MG TABLET: 50 | 30 days supply | Qty: 30 | Fill #0

## 2017-12-08 MED FILL — NORETHINDRONE 0.35 MG TAB: 0.35 | 84 days supply | Qty: 84 | Fill #1

## 2017-12-08 MED FILL — IMIPRAMINE HCL 50 MG TABLET: 50 | 30 days supply | Qty: 30 | Fill #1

## 2017-12-26 ENCOUNTER — Ambulatory Visit
Admission: RE | Admit: 2017-12-26 | Discharge: 2017-12-26 | Disposition: A | Discharge status: Home / Self Care | Payer: 59 | Source: Ambulatory Visit | Attending: Women's Health | Admitting: Women's Health

## 2017-12-26 DIAGNOSIS — Z1231 Encounter for screening mammogram for malignant neoplasm of breast: Secondary | ICD-10-CM | POA: Diagnosis not present

## 2017-12-29 ENCOUNTER — Encounter (INDEPENDENT_AMBULATORY_CARE_PROVIDER_SITE_OTHER): Payer: Self-pay

## 2018-01-07 MED FILL — IMIPRAMINE HCL 50 MG TABLET: 50 | 30 days supply | Qty: 30 | Fill #2

## 2018-01-23 DIAGNOSIS — H524 Presbyopia: Secondary | ICD-10-CM | POA: Diagnosis not present

## 2018-01-23 DIAGNOSIS — H5201 Hypermetropia, right eye: Secondary | ICD-10-CM | POA: Diagnosis not present

## 2018-01-23 DIAGNOSIS — H40053 Ocular hypertension, bilateral: Secondary | ICD-10-CM | POA: Diagnosis not present

## 2018-02-09 MED FILL — IMIPRAMINE HCL 50 MG TABLET: 50 | 30 days supply | Qty: 30 | Fill #3

## 2018-03-10 ENCOUNTER — Ambulatory Visit (INDEPENDENT_AMBULATORY_CARE_PROVIDER_SITE_OTHER): Payer: 59 | Admitting: Women's Health

## 2018-03-10 ENCOUNTER — Encounter: Payer: Self-pay | Admitting: Women's Health

## 2018-03-10 VITALS — BP 128/70 | Ht 60.0 in

## 2018-03-10 DIAGNOSIS — Z13220 Encounter for screening for lipoid disorders: Secondary | ICD-10-CM

## 2018-03-10 DIAGNOSIS — E559 Vitamin D deficiency, unspecified: Secondary | ICD-10-CM

## 2018-03-10 DIAGNOSIS — Z01419 Encounter for gynecological examination (general) (routine) without abnormal findings: Secondary | ICD-10-CM

## 2018-03-10 MED ORDER — NORETHINDRONE 0.35 MG PO TABS: 1.0000 | ORAL_TABLET | Freq: Every day | ORAL | 4 refills | Status: DC

## 2018-03-10 MED FILL — IMIPRAMINE HCL 50 MG TABLET: 50 | 30 days supply | Qty: 30 | Fill #4

## 2018-03-10 MED FILL — NORETHINDRONE 0.35 MG TAB: 0.35 | 84 days supply | Qty: 84 | Fill #0

## 2018-03-10 NOTE — Patient Instructions (Addendum)
Colonoscopy  [facility] Dr Gessner [phone]  Health Maintenance, Female Adopting a healthy lifestyle and getting preventive care can go a long way to promote health and wellness. Talk with your health care provider about what schedule of regular examinations is right for you. This is a good chance for you to check in with your provider about disease prevention and staying healthy. In between checkups, there are plenty of things you can do on your own. Experts have done a lot of research about which lifestyle changes and preventive measures are most likely to keep you healthy. Ask your health care provider for more information. Weight and diet Eat a healthy diet  Be sure to include plenty of vegetables, fruits, low-fat dairy products, and lean protein.  Do not eat a lot of foods high in solid fats, added sugars, or salt.  Get regular exercise. This is one of the most important things you can do for your health. ? Most adults should exercise for at least 150 minutes each week. The exercise should increase your heart rate and make you sweat (moderate-intensity exercise). ? Most adults should also do strengthening exercises at least twice a week. This is in addition to the moderate-intensity exercise.  Maintain a healthy weight  Body mass index (BMI) is a measurement that can be used to identify possible weight problems. It estimates body fat based on height and weight. Your health care provider can help determine your BMI and help you achieve or maintain a healthy weight.  For females 20 years of age and older: ? A BMI below 18.5 is considered underweight. ? A BMI of 18.5 to 24.9 is normal. ? A BMI of 25 to 29.9 is considered overweight. ? A BMI of 30 and above is considered obese.  Watch levels of cholesterol and blood lipids  You should start having your blood tested for lipids and cholesterol at 50 years of age, then have this test every 5 years.  You may need to have your cholesterol  levels checked more often if: ? Your lipid or cholesterol levels are high. ? You are older than 50 years of age. ? You are at high risk for heart disease.  Cancer screening Lung Cancer  Lung cancer screening is recommended for adults 55-80 years old who are at high risk for lung cancer because of a history of smoking.  A yearly low-dose CT scan of the lungs is recommended for people who: ? Currently smoke. ? Have quit within the past 15 years. ? Have at least a 30-pack-year history of smoking. A pack year is smoking an average of one pack of cigarettes a day for 1 year.  Yearly screening should continue until it has been 15 years since you quit.  Yearly screening should stop if you develop a health problem that would prevent you from having lung cancer treatment.  Breast Cancer  Practice breast self-awareness. This means understanding how your breasts normally appear and feel.  It also means doing regular breast self-exams. Let your health care provider know about any changes, no matter how small.  If you are in your 20s or 30s, you should have a clinical breast exam (CBE) by a health care provider every 1-3 years as part of a regular health exam.  If you are 40 or older, have a CBE every year. Also consider having a breast X-ray (mammogram) every year.  If you have a family history of breast cancer, talk to your health care provider about genetic   screening.  If you are at high risk for breast cancer, talk to your health care provider about having an MRI and a mammogram every year.  Breast cancer gene (BRCA) assessment is recommended for women who have family members with BRCA-related cancers. BRCA-related cancers include: ? Breast. ? Ovarian. ? Tubal. ? Peritoneal cancers.  Results of the assessment will determine the need for genetic counseling and BRCA1 and BRCA2 testing.  Cervical Cancer Your health care provider may recommend that you be screened regularly  for cancer of the pelvic organs (ovaries, uterus, and vagina). This screening involves a pelvic examination, including checking for microscopic changes to the surface of your cervix (Pap test). You may be encouraged to have this screening done every 3 years, beginning at age 27.  For women ages 41-65, health care providers may recommend pelvic exams and Pap testing every 3 years, or they may recommend the Pap and pelvic exam, combined with testing for human papilloma virus (HPV), every 5 years. Some types of HPV increase your risk of cervical cancer. Testing for HPV may also be done on women of any age with unclear Pap test results.  Other health care providers may not recommend any screening for nonpregnant women who are considered low risk for pelvic cancer and who do not have symptoms. Ask your health care provider if a screening pelvic exam is right for you.  If you have had past treatment for cervical cancer or a condition that could lead to cancer, you need Pap tests and screening for cancer for at least 20 years after your treatment. If Pap tests have been discontinued, your risk factors (such as having a new sexual partner) need to be reassessed to determine if screening should resume. Some women have medical problems that increase the chance of getting cervical cancer. In these cases, your health care provider may recommend more frequent screening and Pap tests.  Colorectal Cancer  This type of cancer can be detected and often prevented.  Routine colorectal cancer screening usually begins at 50 years of age and continues through 50 years of age.  Your health care provider may recommend screening at an earlier age if you have risk factors for colon cancer.  Your health care provider may also recommend using home test kits to check for hidden blood in the stool.  A small camera at the end of a tube can be used to examine your colon directly (sigmoidoscopy or colonoscopy). This is done to  check for the earliest forms of colorectal cancer.  Routine screening usually begins at age 26.  Direct examination of the colon should be repeated every 5-10 years through 50 years of age. However, you may need to be screened more often if early forms of precancerous polyps or small growths are found.  Skin Cancer  Check your skin from head to toe regularly.  Tell your health care provider about any new moles or changes in moles, especially if there is a change in a mole's shape or color.  Also tell your health care provider if you have a mole that is larger than the size of a pencil eraser.  Always use sunscreen. Apply sunscreen liberally and repeatedly throughout the day.  Protect yourself by wearing long sleeves, pants, a wide-brimmed hat, and sunglasses whenever you are outside.  Heart disease, diabetes, and high blood pressure  High blood pressure causes heart disease and increases the risk of stroke. High blood pressure is more likely to develop in: ? People who  have blood pressure in the high end of the normal range (130-139/85-89 mm Hg). ? People who are overweight or obese. ? People who are African American.  If you are 27-84 years of age, have your blood pressure checked every 3-5 years. If you are 16 years of age or older, have your blood pressure checked every year. You should have your blood pressure measured twice-once when you are at a hospital or clinic, and once when you are not at a hospital or clinic. Record the average of the two measurements. To check your blood pressure when you are not at a hospital or clinic, you can use: ? An automated blood pressure machine at a pharmacy. ? A home blood pressure monitor.  If you are between 32 years and 77 years old, ask your health care provider if you should take aspirin to prevent strokes.  Have regular diabetes screenings. This involves taking a blood sample to check your fasting blood sugar level. ? If you are at a  normal weight and have a low risk for diabetes, have this test once every three years after 50 years of age. ? If you are overweight and have a high risk for diabetes, consider being tested at a younger age or more often. Preventing infection Hepatitis B  If you have a higher risk for hepatitis B, you should be screened for this virus. You are considered at high risk for hepatitis B if: ? You were born in a country where hepatitis B is common. Ask your health care provider which countries are considered high risk. ? Your parents were born in a high-risk country, and you have not been immunized against hepatitis B (hepatitis B vaccine). ? You have HIV or AIDS. ? You use needles to inject street drugs. ? You live with someone who has hepatitis B. ? You have had sex with someone who has hepatitis B. ? You get hemodialysis treatment. ? You take certain medicines for conditions, including cancer, organ transplantation, and autoimmune conditions.  Hepatitis C  Blood testing is recommended for: ? Everyone born from [birth year] through 1963. ? Anyone with known risk factors for hepatitis C.  Sexually transmitted infections (STIs)  You should be screened for sexually transmitted infections (STIs) including gonorrhea and chlamydia if: ? You are sexually active and are younger than 50 years of age. ? You are older than 50 years of age and your health care provider tells you that you are at risk for this type of infection. ? Your sexual activity has changed since you were last screened and you are at an increased risk for chlamydia or gonorrhea. Ask your health care provider if you are at risk.  If you do not have HIV, but are at risk, it may be recommended that you take a prescription medicine daily to prevent HIV infection. This is called pre-exposure prophylaxis (PrEP). You are considered at risk if: ? You are sexually active and do not regularly use condoms or know the HIV status of your  partner(s). ? You take drugs by injection. ? You are sexually active with a partner who has HIV.  Talk with your health care provider about whether you are at high risk of being infected with HIV. If you choose to begin PrEP, you should first be tested for HIV. You should then be tested every 3 months for as long as you are taking PrEP. Pregnancy  If you are premenopausal and you may become pregnant, ask your health care provider  about preconception counseling.  If you may become pregnant, take 400 to 800 micrograms (mcg) of folic acid every day.  If you want to prevent pregnancy, talk to your health care provider about birth control (contraception). Osteoporosis and menopause  Osteoporosis is a disease in which the bones lose minerals and strength with aging. This can result in serious bone fractures. Your risk for osteoporosis can be identified using a bone density scan.  If you are 78 years of age or older, or if you are at risk for osteoporosis and fractures, ask your health care provider if you should be screened.  Ask your health care provider whether you should take a calcium or vitamin D supplement to lower your risk for osteoporosis.  Menopause may have certain physical symptoms and risks.  Hormone replacement therapy may reduce some of these symptoms and risks. Talk to your health care provider about whether hormone replacement therapy is right for you. Follow these instructions at home:  Schedule regular health, dental, and eye exams.  Stay current with your immunizations.  Do not use any tobacco products including cigarettes, chewing tobacco, or electronic cigarettes.  If you are pregnant, do not drink alcohol.  If you are breastfeeding, limit how much and how often you drink alcohol.  Limit alcohol intake to no more than 1 drink per day for nonpregnant women. One drink equals 12 ounces of beer, 5 ounces of wine, or 1 ounces of hard liquor.  Do not use street  drugs.  Do not share needles.  Ask your health care provider for help if you need support or information about quitting drugs.  Tell your health care provider if you often feel depressed.  Tell your health care provider if you have ever been abused or do not feel safe at home. This information is not intended to replace advice given to you by your health care provider. Make sure you discuss any questions you have with your health care provider. Document Released: 12/03/2010 Document Revised: 10/26/2015 Document Reviewed: 02/21/2015 Elsevier Interactive Patient Education  [employer].

## 2018-03-10 NOTE — Progress Notes (Signed)
Gabrielle Cross 11/30/1965 [id number]    History:    Presents for annual exam.  Amenorrheic on Micronor.  Had one heavy cycle in June only.  History of migraines , neurologist manages.  Normal Pap and mammogram history.  Has begun an exercise program.  Past medical history, past surgical history, family history and social history were all reviewed and documented in the EPIC chart.  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  Daughter Gabrielle Cross 16 and doing well.  Father  Hypercholesteremia.  History of ruptured ectopic.  ROS:  A ROS was performed and pertinent positives and negatives are included.  Exam:  Vitals:   03/10/18 0756  Height: 5' (1.524 m)   Body mass index is 29.29 kg/m.   General appearance:  Normal Thyroid:  Symmetrical, normal in size, without palpable masses or nodularity. Respiratory  Auscultation:  Clear without wheezing or rhonchi Cardiovascular  Auscultation:  Regular rate, without rubs, murmurs or gallops  Edema/varicosities:  Not grossly evident Abdominal  Soft,nontender, without masses, guarding or rebound.  Liver/spleen:  No organomegaly noted  Hernia:  None appreciated  Skin  Inspection:  Grossly normal   Breasts: Examined lying and sitting.     Right: Without masses, retractions, discharge or axillary adenopathy.     Left: Without masses, retractions, discharge or axillary adenopathy. Gentitourinary   Inguinal/mons:  Normal without inguinal adenopathy  External genitalia:  Normal  BUS/Urethra/Skene's glands:  Normal  Vagina:  Normal  Cervix:  Normal  Uterus:  normal in size, shape and contour.  Midline and mobile  Adnexa/parametria:     Rt: Without masses or tenderness.   Lt: Without masses or tenderness.  Anus and perineum: Normal  Digital rectal exam: Normal sphincter tone without palpated masses or tenderness  Assessment/Plan:  52 y.o. MHF G2 P1 for annual exam no complaints.  Rare cycles on Micronor Migraines-neurologist manages  Plan: Micronor  prescription, proper use, slight risk for blood clots and strokes reviewed.  Currently no menopausal symptoms.  SBE's, continue annual 3D screening mammogram, calcium rich foods, vitamin D 2000 daily encouraged.  Screening colonoscopy discussed and encouraged, [facility] information given instructed to schedule.  Congratulated on starting exercise program encouraged to continue at least 3 times weekly.  Encuraged low-carb/calorie diet.  Pap normal 2017, new screening guidelines reviewed.  CBC, CMP, lipid panel, vitamin D.    [place], 7:59 AM 03/10/2018

## 2018-03-11 LAB — LIPID PANEL
CHOL/HDL RATIO: 5.7 (calc) — AB (ref <5.0)
Cholesterol: 211 mg/dL — ABNORMAL HIGH (ref <200)
HDL: 37 mg/dL — AB (ref >50)
LDL Cholesterol (Calc): 155 mg/dL (calc) — ABNORMAL HIGH
Non-HDL Cholesterol (Calc): 174 mg/dL (calc) — ABNORMAL HIGH (ref <130)
Triglycerides: 87 mg/dL (ref <150)

## 2018-03-11 LAB — CBC WITH DIFFERENTIAL/PLATELET
BASOS PCT: 0.3 %
Basophils Absolute: 20 cells/uL (ref 0–200)
EOS ABS: 129 {cells}/uL (ref 15–500)
Eosinophils Relative: 1.9 %
HEMATOCRIT: 46.5 % — AB (ref 35.0–45.0)
HEMOGLOBIN: 15.3 g/dL (ref 11.7–15.5)
LYMPHS ABS: 2088 {cells}/uL (ref 850–3900)
MCH: 27.4 pg (ref 27.0–33.0)
MCHC: 32.9 g/dL (ref 32.0–36.0)
MCV: 83.2 fL (ref 80.0–100.0)
MONOS PCT: 6.1 %
MPV: 9.9 fL (ref 7.5–12.5)
NEUTROS ABS: 4148 {cells}/uL (ref 1500–7800)
Neutrophils Relative %: 61 %
Platelets: 318 10*3/uL (ref 140–400)
RBC: 5.59 10*6/uL — ABNORMAL HIGH (ref 3.80–5.10)
RDW: 13 % (ref 11.0–15.0)
Total Lymphocyte: 30.7 %
WBC: 6.8 10*3/uL (ref 3.8–10.8)
WBCMIX: 415 {cells}/uL (ref 200–950)

## 2018-03-11 LAB — COMPREHENSIVE METABOLIC PANEL
AG RATIO: 1.4 (calc) (ref 1.0–2.5)
ALT: 14 U/L (ref 6–29)
AST: 12 U/L (ref 10–35)
Albumin: 4.1 g/dL (ref 3.6–5.1)
Alkaline phosphatase (APISO): 77 U/L (ref 33–130)
BILIRUBIN TOTAL: 0.4 mg/dL (ref 0.2–1.2)
BUN: 15 mg/dL (ref 7–25)
CHLORIDE: 105 mmol/L (ref 98–110)
CO2: 23 mmol/L (ref 20–32)
Calcium: 9.2 mg/dL (ref 8.6–10.4)
Creat: 0.78 mg/dL (ref 0.50–1.05)
GLOBULIN: 2.9 g/dL (ref 1.9–3.7)
Glucose, Bld: 91 mg/dL (ref 65–99)
Potassium: 4.4 mmol/L (ref 3.5–5.3)
SODIUM: 137 mmol/L (ref 135–146)
Total Protein: 7 g/dL (ref 6.1–8.1)

## 2018-03-11 LAB — VITAMIN D 25 HYDROXY (VIT D DEFICIENCY, FRACTURES): VIT D 25 HYDROXY: 60 ng/mL (ref 30–100)

## 2018-04-09 MED FILL — IMIPRAMINE HCL 50 MG TABLET: 50 | 30 days supply | Qty: 30 | Fill #5

## 2018-05-12 DIAGNOSIS — G43019 Migraine without aura, intractable, without status migrainosus: Secondary | ICD-10-CM | POA: Diagnosis not present

## 2018-05-12 MED FILL — RIZATRIPTAN BENZOATE 10 MG: 10 | 7 days supply | Qty: 4 | Fill #0

## 2018-05-12 MED FILL — IMIPRAMINE HCL 50 MG TABLET: 50 | 30 days supply | Qty: 30 | Fill #0

## 2018-06-03 HISTORY — PX: SKIN CANCER EXCISION: SHX779

## 2018-06-09 MED FILL — IMIPRAMINE HCL 50 MG TABLET: 50 | 30 days supply | Qty: 30 | Fill #1

## 2018-06-09 MED FILL — NORETHINDRONE 0.35 MG TAB: 0.35 | 84 days supply | Qty: 84 | Fill #1

## 2018-07-07 MED FILL — IMIPRAMINE HCL 50 MG TABLET: 50 | 30 days supply | Qty: 30 | Fill #2

## 2018-08-10 MED FILL — IMIPRAMINE HCL 50 MG TABLET: 50 | 30 days supply | Qty: 30 | Fill #3

## 2018-09-02 MED FILL — IMIPRAMINE HCL 50 MG TABLET: 50 | 60 days supply | Qty: 60 | Fill #4

## 2018-09-02 MED FILL — NORETHINDRONE 0.35 MG TAB: 0.35 | 84 days supply | Qty: 84 | Fill #2

## 2018-11-03 DIAGNOSIS — G43019 Migraine without aura, intractable, without status migrainosus: Secondary | ICD-10-CM | POA: Diagnosis not present

## 2018-11-03 MED FILL — IMIPRAMINE HCL 50 MG TABLET: 50 | 30 days supply | Qty: 30 | Fill #0

## 2018-11-03 MED FILL — RIZATRIPTAN BENZOATE 10 MG: 10 | 7 days supply | Qty: 4 | Fill #0

## 2018-11-09 DIAGNOSIS — L821 Other seborrheic keratosis: Secondary | ICD-10-CM | POA: Diagnosis not present

## 2018-11-09 DIAGNOSIS — L820 Inflamed seborrheic keratosis: Secondary | ICD-10-CM | POA: Diagnosis not present

## 2018-11-29 MED FILL — NORETHINDRONE 0.35 MG TAB: 0.35 | 84 days supply | Qty: 84 | Fill #3

## 2018-11-30 MED FILL — IMIPRAMINE HCL 50 MG TABLET: 50 | 30 days supply | Qty: 30 | Fill #1

## 2019-01-05 ENCOUNTER — Other Ambulatory Visit: Payer: Self-pay | Admitting: Women's Health

## 2019-01-05 DIAGNOSIS — Z1231 Encounter for screening mammogram for malignant neoplasm of breast: Secondary | ICD-10-CM

## 2019-01-06 ENCOUNTER — Other Ambulatory Visit: Payer: Self-pay | Admitting: Women's Health

## 2019-01-06 ENCOUNTER — Telehealth: Payer: Self-pay

## 2019-01-06 DIAGNOSIS — R4589 Other symptoms and signs involving emotional state: Secondary | ICD-10-CM

## 2019-01-06 DIAGNOSIS — R232 Flushing: Secondary | ICD-10-CM

## 2019-01-06 DIAGNOSIS — R454 Irritability and anger: Secondary | ICD-10-CM

## 2019-01-06 NOTE — Telephone Encounter (Signed)
Patient informed. She will stop her oc's for a week and check [facility] on pill free week.  Order placed.

## 2019-01-06 NOTE — Telephone Encounter (Signed)
Okay to check [place], could be part of it but also all this isolation and not being able to go out like we want to is depressing

## 2019-01-06 NOTE — Telephone Encounter (Signed)
Patient on continuous active OC's.  She is having feelings that feel out of her control like being very agitated, irritated and unexpected crying episodes.  She said nothing in her life is different/wrong she just feels all these feelings.  She does have hot spells as well. Not true flashes but will notice how hot she is and turn on her fan and later has cooled off.    She wonders if she might be menopausal and would like to check an [facility] again as the first step in trying to figure out what might be going on.

## 2019-01-07 MED FILL — IMIPRAMINE HCL 50 MG TABLET: 50 | 30 days supply | Qty: 30 | Fill #2

## 2019-01-13 ENCOUNTER — Ambulatory Visit
Admission: RE | Admit: 2019-01-13 | Discharge: 2019-01-13 | Disposition: A | Discharge status: Home / Self Care | Payer: 59 | Source: Ambulatory Visit

## 2019-01-13 ENCOUNTER — Other Ambulatory Visit: Payer: Self-pay

## 2019-01-13 DIAGNOSIS — Z1231 Encounter for screening mammogram for malignant neoplasm of breast: Secondary | ICD-10-CM | POA: Diagnosis not present

## 2019-01-14 ENCOUNTER — Encounter: Payer: Self-pay | Admitting: Women's Health

## 2019-02-09 MED FILL — IMIPRAMINE HCL 50 MG TABLET: 50 | 30 days supply | Qty: 30 | Fill #3

## 2019-02-09 MED FILL — NORETHINDRONE 0.35 MG TABS: 0.35 | 84 days supply | Qty: 84 | Fill #4

## 2019-02-11 DIAGNOSIS — D485 Neoplasm of uncertain behavior of skin: Secondary | ICD-10-CM | POA: Diagnosis not present

## 2019-02-11 DIAGNOSIS — C44329 Squamous cell carcinoma of skin of other parts of face: Secondary | ICD-10-CM | POA: Diagnosis not present

## 2019-02-22 DIAGNOSIS — C44329 Squamous cell carcinoma of skin of other parts of face: Secondary | ICD-10-CM | POA: Diagnosis not present

## 2019-02-22 HISTORY — PX: OTHER SURGICAL HISTORY: SHX169

## 2019-02-24 ENCOUNTER — Encounter: Payer: Self-pay | Admitting: Gynecology

## 2019-03-04 MED FILL — IMIPRAMINE HCL 50 MG TABLET: 50 | 30 days supply | Qty: 30 | Fill #4

## 2019-03-24 ENCOUNTER — Encounter: Payer: Self-pay | Admitting: Women's Health

## 2019-03-24 ENCOUNTER — Ambulatory Visit (INDEPENDENT_AMBULATORY_CARE_PROVIDER_SITE_OTHER): Payer: 59 | Admitting: Women's Health

## 2019-03-24 ENCOUNTER — Other Ambulatory Visit: Payer: Self-pay

## 2019-03-24 VITALS — BP 120/82

## 2019-03-24 DIAGNOSIS — Z01419 Encounter for gynecological examination (general) (routine) without abnormal findings: Secondary | ICD-10-CM | POA: Diagnosis not present

## 2019-03-24 DIAGNOSIS — Z13220 Encounter for screening for lipoid disorders: Secondary | ICD-10-CM

## 2019-03-24 DIAGNOSIS — C44329 Squamous cell carcinoma of skin of other parts of face: Secondary | ICD-10-CM | POA: Insufficient documentation

## 2019-03-24 MED ORDER — NORETHINDRONE 0.35 MG PO TABS: 1.0000 | ORAL_TABLET | Freq: Every day | ORAL | 4 refills | Status: DC

## 2019-03-24 NOTE — Patient Instructions (Addendum)
[facility]  Dr Carlean Purl  [phone]  Health Maintenance, Female Adopting a healthy lifestyle and getting preventive care are important in promoting health and wellness. Ask your health care provider about:  The right schedule for you to have regular tests and exams.  Things you can do on your own to prevent diseases and keep yourself healthy. What should I know about diet, weight, and exercise? Eat a healthy diet   Eat a diet that includes plenty of vegetables, fruits, low-fat dairy products, and lean protein.  Do not eat a lot of foods that are high in solid fats, added sugars, or sodium. Maintain a healthy weight Body mass index (BMI) is used to identify weight problems. It estimates body fat based on height and weight. Your health care provider can help determine your BMI and help you achieve or maintain a healthy weight. Get regular exercise Get regular exercise. This is one of the most important things you can do for your health. Most adults should:  Exercise for at least 150 minutes each week. The exercise should increase your heart rate and make you sweat (moderate-intensity exercise).  Do strengthening exercises at least twice a week. This is in addition to the moderate-intensity exercise.  Spend less time sitting. Even light physical activity can be beneficial. Watch cholesterol and blood lipids Have your blood tested for lipids and cholesterol at 51 years of age, then have this test every 5 years. Have your cholesterol levels checked more often if:  Your lipid or cholesterol levels are high.  You are older than 51 years of age.  You are at high risk for heart disease. What should I know about cancer screening? Depending on your health history and family history, you may need to have cancer screening at various ages. This may include screening for:  Breast cancer.  Cervical cancer.  Colorectal cancer.  Skin cancer.  Lung cancer. What should I know about  heart disease, diabetes, and high blood pressure? Blood pressure and heart disease  High blood pressure causes heart disease and increases the risk of stroke. This is more likely to develop in people who have high blood pressure readings, are of African descent, or are overweight.  Have your blood pressure checked: ? Every 3-5 years if you are 85-51 years of age. ? Every year if you are 51 years old or older. Diabetes Have regular diabetes screenings. This checks your fasting blood sugar level. Have the screening done:  Once every three years after age 51 if you are at a normal weight and have a low risk for diabetes.  More often and at a younger age if you are overweight or have a high risk for diabetes. What should I know about preventing infection? Hepatitis B If you have a higher risk for hepatitis B, you should be screened for this virus. Talk with your health care provider to find out if you are at risk for hepatitis B infection. Hepatitis C Testing is recommended for:  Everyone born from [birth year] through 1963.  Anyone with known risk factors for hepatitis C. Sexually transmitted infections (STIs)  Get screened for STIs, including gonorrhea and chlamydia, if: ? You are sexually active and are younger than 51 years of age. ? You are older than 51 years of age and your health care provider tells you that you are at risk for this type of infection. ? Your sexual activity has changed since you were last screened, and you are at increased risk for  chlamydia or gonorrhea. Ask your health care provider if you are at risk.  Ask your health care provider about whether you are at high risk for HIV. Your health care provider may recommend a prescription medicine to help prevent HIV infection. If you choose to take medicine to prevent HIV, you should first get tested for HIV. You should then be tested every 3 months for as long as you are taking the medicine. Pregnancy  If you are about to  stop having your period (premenopausal) and you may become pregnant, seek counseling before you get pregnant.  Take 400 to 800 micrograms (mcg) of folic acid every day if you become pregnant.  Ask for birth control (contraception) if you want to prevent pregnancy. Osteoporosis and menopause Osteoporosis is a disease in which the bones lose minerals and strength with aging. This can result in bone fractures. If you are 38 years old or older, or if you are at risk for osteoporosis and fractures, ask your health care provider if you should:  Be screened for bone loss.  Take a calcium or vitamin D supplement to lower your risk of fractures.  Be given hormone replacement therapy (HRT) to treat symptoms of menopause. Follow these instructions at home: Lifestyle  Do not use any products that contain nicotine or tobacco, such as cigarettes, e-cigarettes, and chewing tobacco. If you need help quitting, ask your health care provider.  Do not use street drugs.  Do not share needles.  Ask your health care provider for help if you need support or information about quitting drugs. Alcohol use  Do not drink alcohol if: ? Your health care provider tells you not to drink. ? You are pregnant, may be pregnant, or are planning to become pregnant.  If you drink alcohol: ? Limit how much you use to 0-1 drink a day. ? Limit intake if you are breastfeeding.  Be aware of how much alcohol is in your drink. In the [place] one drink equals one 12 oz bottle of beer (355 mL), one 5 oz glass of wine (148 mL), or one 1 oz glass of hard liquor (44 mL). General instructions  Schedule regular health, dental, and eye exams.  Stay current with your vaccines.  Tell your health care provider if: ? You often feel depressed. ? You have ever been abused or do not feel safe at home. Summary  Adopting a healthy lifestyle and getting preventive care are important in promoting health and wellness.  Follow your  health care provider's instructions about healthy diet, exercising, and getting tested or screened for diseases.  Follow your health care provider's instructions on monitoring your cholesterol and blood pressure. This information is not intended to replace advice given to you by your health care provider. Make sure you discuss any questions you have with your health care provider. Document Released: 12/03/2010 Document Revised: 05/13/2018 Document Reviewed: 05/13/2018 Elsevier Patient Education  2020 [employer].

## 2019-03-24 NOTE — Addendum Note (Signed)
Addended by: Joaquin Music on: 03/24/2019 08:47 AM   Modules accepted: Orders

## 2019-03-24 NOTE — Progress Notes (Addendum)
Gabrielle Cross [birth year] [id number]    History:    Presents for annual exam.  Occasional spotting on Micronor,  menopausal symptoms of hot flashes, irritability not daily.  Normal Pap and mammogram history.  Has not had a screening colonoscopy.  02/2019 squamous carcinoma right face, margins negative.  History of elevated cholesterol.  Migraines less frequent.  Past medical history, past surgical history, family history and social history were all reviewed and documented in the EPIC chart.  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  Daughter [name] 19 excellent student.  Father hypercholesteremia.  History of a ruptured ectopic.   ROS:  A ROS was performed and pertinent positives and negatives are included.  Exam:  Vitals:   03/24/19 0804  BP: 120/82   There is no height or weight on file to calculate BMI.   General appearance:  Normal Thyroid:  Symmetrical, normal in size, without palpable masses or nodularity. Respiratory  Auscultation:  Clear without wheezing or rhonchi Cardiovascular  Auscultation:  Regular rate, without rubs, murmurs or gallops  Edema/varicosities:  Not grossly evident Abdominal  Soft,nontender, without masses, guarding or rebound.  Liver/spleen:  No organomegaly noted  Hernia:  None appreciated  Skin  Inspection:  Grossly normal   Breasts: Examined lying and sitting.     Right: Without masses, retractions, discharge or axillary adenopathy.     Left: Without masses, retractions, discharge or axillary adenopathy. Gentitourinary   Inguinal/mons:  Normal without inguinal adenopathy  External genitalia:  Normal  BUS/Urethra/Skene's glands:  Normal  Vagina:  Normal  Cervix:  Normal  Uterus:   normal in size, shape and contour.  Midline and mobile  Adnexa/parametria:     Rt: Without masses or tenderness.   Lt: Without masses or tenderness.  Anus and perineum: Normal  Digital rectal exam: Normal sphincter tone without palpated masses or  tenderness  Assessment/Plan:  53 y.o. MHF G2, P1 for annual exam with no complaints.  Occasional spotting on Micronor/perimenopausal 02/2019 squamous cell skin cancerleft face Migraines-decreasing frequency  Plan: Micronor prescription, proper use given and reviewed, will continue 1 more year and then check [place].  Annual skin check reviewed importance, aware of need for sunscreens.  Screening colonoscopy discussed and encouraged, [facility] information given.  SBEs, continue annual screening mammogram, calcium rich foods, vitamin D level normal at 60.  Aware of importance of increasing regular cardio type exercise, encouraged 20-minute daily brisk walk, less than 20 g saturated fat daily, and fish oil supplement.  Aware of need to decrease calorie/carbs.  Will return to office fasting for CBC, CMP, lipid panel, Pap normal 2017, new screening guidelines reviewed.[name] [facility], 8:09 AM 03/24/2019

## 2019-04-12 MED FILL — IMIPRAMINE HCL 50 MG TABLET: 50 | 30 days supply | Qty: 30 | Fill #5

## 2019-05-14 MED FILL — NORETHINDRONE 0.35 MG TABS: 0.35 | 84 days supply | Qty: 84 | Fill #0

## 2019-05-17 MED FILL — IMIPRAMINE HCL 50 MG TABLET: 50 | 30 days supply | Qty: 30 | Fill #0

## 2019-06-07 DIAGNOSIS — G43019 Migraine without aura, intractable, without status migrainosus: Secondary | ICD-10-CM | POA: Diagnosis not present

## 2019-06-07 MED FILL — RIZATRIPTAN BENZOATE 10 MG: 10 | 7 days supply | Qty: 4 | Fill #0

## 2019-06-09 ENCOUNTER — Other Ambulatory Visit: Payer: Self-pay

## 2019-06-09 ENCOUNTER — Encounter: Payer: Self-pay | Admitting: Women's Health

## 2019-06-09 ENCOUNTER — Ambulatory Visit (INDEPENDENT_AMBULATORY_CARE_PROVIDER_SITE_OTHER): Payer: 59 | Admitting: Women's Health

## 2019-06-09 DIAGNOSIS — Z308 Encounter for other contraceptive management: Secondary | ICD-10-CM

## 2019-06-09 MED FILL — IMIPRAMINE HCL 25 MG TABLET: 25 | 30 days supply | Qty: 90 | Fill #0

## 2019-06-10 NOTE — Progress Notes (Signed)
Discussed briefly micronor can cause irreg cycles and occas menstrual headaches

## 2019-06-30 ENCOUNTER — Encounter: Payer: Self-pay | Admitting: Internal Medicine

## 2019-07-12 MED FILL — IMIPRAMINE HCL 25 MG TABLET: 25 | 30 days supply | Qty: 90 | Fill #1

## 2019-08-02 MED FILL — NORETHINDRONE 0.35 MG TABS: 0.35 | 84 days supply | Qty: 84 | Fill #1

## 2019-08-16 MED FILL — IMIPRAMINE HCL 25 MG TABLET: 25 | 30 days supply | Qty: 90 | Fill #2

## 2019-08-19 ENCOUNTER — Other Ambulatory Visit: Payer: Self-pay

## 2019-08-19 ENCOUNTER — Ambulatory Visit (AMBULATORY_SURGERY_CENTER): Payer: Self-pay | Admitting: *Deleted

## 2019-08-19 VITALS — Temp 96.9°F | Ht 60.0 in | Wt 178.0 lb

## 2019-08-19 DIAGNOSIS — Z1211 Encounter for screening for malignant neoplasm of colon: Secondary | ICD-10-CM

## 2019-08-19 MED ORDER — NA SULFATE-K SULFATE-MG SULF 17.5-3.13-1.6 GM/177ML PO SOLN: ORAL | 0 refills | Status: DC

## 2019-08-19 MED FILL — SUPREP BOWEL PREP KIT: 17.5-3.13-1 | 2 days supply | Qty: 354 | Fill #0

## 2019-08-19 NOTE — Progress Notes (Signed)
Patient is here in-person for PV. Patient denies any allergies to eggs or soy. Patient denies any problems with anesthesia/sedation. Patient denies any oxygen use at home. Patient denies taking any diet/weight loss medications or blood thinners. Patient is not being treated for MRSA or C-diff. EMMI education assisgned to the patient for the procedure, this was explained and instructions given to patient. COVID-19 screening test is not needed, pt has had both covid vaccines, 2nd on 07/13/19.  Patient is aware of our care-partner policy and 0000000 safety protocol.

## 2019-09-02 ENCOUNTER — Encounter: Payer: Self-pay | Admitting: Internal Medicine

## 2019-09-02 ENCOUNTER — Other Ambulatory Visit: Payer: Self-pay

## 2019-09-02 ENCOUNTER — Ambulatory Visit (AMBULATORY_SURGERY_CENTER): Payer: 59 | Admitting: Internal Medicine

## 2019-09-02 VITALS — BP 121/80 | HR 78 | Temp 97.3°F | Resp 17 | Ht 60.0 in | Wt 178.0 lb

## 2019-09-02 DIAGNOSIS — D122 Benign neoplasm of ascending colon: Secondary | ICD-10-CM

## 2019-09-02 DIAGNOSIS — Z1211 Encounter for screening for malignant neoplasm of colon: Secondary | ICD-10-CM

## 2019-09-02 DIAGNOSIS — D123 Benign neoplasm of transverse colon: Secondary | ICD-10-CM

## 2019-09-02 MED ORDER — SODIUM CHLORIDE 0.9 % IV SOLN: 500.0000 mL | Freq: Once | INTRAVENOUS | Status: DC

## 2019-09-02 NOTE — Progress Notes (Signed)
Called to room to assist during endoscopic procedure.  Patient ID and intended procedure confirmed with present staff. Received instructions for my participation in the procedure from the performing physician.  

## 2019-09-02 NOTE — Op Note (Signed)
[facility] Patient Name: Gabrielle Cross Procedure Date: 09/02/2019 9:03 AM MRN: [id number] Endoscopist: Docia Chuck. Henrene Pastor , MD Age: 54 Referring MD:  Date of Birth: 27-Jan-1966 Gender: Female Account #: [id number] Procedure:                Colonoscopy with cold snare polypectomy x 5 Indications:              Screening for colorectal malignant neoplasm Medicines:                Monitored Anesthesia Care Procedure:                Pre-Anesthesia Assessment:                           - Prior to the procedure, a History and Physical                            was performed, and patient medications and                            allergies were reviewed. The patient's tolerance of                            previous anesthesia was also reviewed. The risks                            and benefits of the procedure and the sedation                            options and risks were discussed with the patient.                            All questions were answered, and informed consent                            was obtained. Prior Anticoagulants: The patient has                            taken no previous anticoagulant or antiplatelet                            agents. ASA Grade Assessment: II - A patient with                            mild systemic disease. After reviewing the risks                            and benefits, the patient was deemed in                            satisfactory condition to undergo the procedure.                           After obtaining informed consent, the colonoscope  was passed under direct vision. Throughout the                            procedure, the patient's blood pressure, pulse, and                            oxygen saturations were monitored continuously. The                            Colonoscope was introduced through the anus and                            advanced to the the cecum, identified by      appendiceal orifice and ileocecal valve. The                            ileocecal valve, appendiceal orifice, and rectum                            were photographed. The quality of the bowel                            preparation was excellent. The colonoscopy was                            performed without difficulty. The patient tolerated                            the procedure well. The bowel preparation used was                            SUPREP via split dose instruction. Scope In: 9:16:12 AM Scope Out: 9:36:15 AM Scope Withdrawal Time: 0 hours 13 minutes 46 seconds  Total Procedure Duration: 0 hours 20 minutes 3 seconds  Findings:                 Five polyps were found in the transverse colon and                            ascending colon. The polyps were 2 to 5 mm in size.                            These polyps were removed with a cold snare.                            Resection and retrieval were complete.                           The exam was otherwise without abnormality on                            direct and retroflexion views. Complications:            No immediate complications. Estimated blood loss:  None. Estimated Blood Loss:     Estimated blood loss: none. Impression:               - Five 2 to 5 mm polyps in the transverse colon and                            in the ascending colon, removed with a cold snare.                            Resected and retrieved.                           - The examination was otherwise normal on direct                            and retroflexion views. Recommendation:           - Repeat colonoscopy in 3 years for surveillance.                           - Patient has a contact number available for                            emergencies. The signs and symptoms of potential                            delayed complications were discussed with the                            patient. Return to normal activities  tomorrow.                            Written discharge instructions were provided to the                            patient.                           - Resume previous diet.                           - Continue present medications.                           - Await pathology results. Docia Chuck. Henrene Pastor, MD 09/02/2019 9:40:58 AM This report has been signed electronically.

## 2019-09-02 NOTE — Progress Notes (Signed)
Pt's states no medical or surgical changes since previsit or office visit. 

## 2019-09-02 NOTE — Progress Notes (Signed)
pt tolerated well. VSS. awake and to recovery. Report given to RN.  

## 2019-09-02 NOTE — Patient Instructions (Signed)
Handout given for polyps.  YOU HAD AN ENDOSCOPIC PROCEDURE TODAY AT THE [facility]:   Refer to the procedure report that was given to you for any specific questions about what was found during the examination.  If the procedure report does not answer your questions, please call your gastroenterologist to clarify.  If you requested that your care partner not be given the details of your procedure findings, then the procedure report has been included in a sealed envelope for you to review at your convenience later.  YOU SHOULD EXPECT: Some feelings of bloating in the abdomen. Passage of more gas than usual.  Walking can help get rid of the air that was put into your GI tract during the procedure and reduce the bloating. If you had a lower endoscopy (such as a colonoscopy or flexible sigmoidoscopy) you may notice spotting of blood in your stool or on the toilet paper. If you underwent a bowel prep for your procedure, you may not have a normal bowel movement for a few days.  Please Note:  You might notice some irritation and congestion in your nose or some drainage.  This is from the oxygen used during your procedure.  There is no need for concern and it should clear up in a day or so.  SYMPTOMS TO REPORT IMMEDIATELY:   Following lower endoscopy (colonoscopy or flexible sigmoidoscopy):  Excessive amounts of blood in the stool  Significant tenderness or worsening of abdominal pains  Swelling of the abdomen that is new, acute  Fever of 100F or higher  For urgent or emergent issues, a gastroenterologist can be reached at any hour by calling [phone]. Do not use MyChart messaging for urgent concerns.    DIET:  We do recommend a small meal at first, but then you may proceed to your regular diet.  Drink plenty of fluids but you should avoid alcoholic beverages for 24 hours.  ACTIVITY:  You should plan to take it easy for the rest of today and you should NOT DRIVE or use heavy  machinery until tomorrow (because of the sedation medicines used during the test).    FOLLOW UP: Our staff will call the number listed on your records 48-72 hours following your procedure to check on you and address any questions or concerns that you may have regarding the information given to you following your procedure. If we do not reach you, we will leave a message.  We will attempt to reach you two times.  During this call, we will ask if you have developed any symptoms of COVID 19. If you develop any symptoms (ie: fever, flu-like symptoms, shortness of breath, cough etc.) before then, please call [phone].  If you test positive for Covid 19 in the 2 weeks post procedure, please call and report this information to us.    If any biopsies were taken you will be contacted by phone or by letter within the next 1-3 weeks.  Please call us at [phone] if you have not heard about the biopsies in 3 weeks.    SIGNATURES/CONFIDENTIALITY: You and/or your care partner have signed paperwork which will be entered into your electronic medical record.  These signatures attest to the fact that that the information above on your After Visit Summary has been reviewed and is understood.  Full responsibility of the confidentiality of this discharge information lies with you and/or your care-partner. 

## 2019-09-07 ENCOUNTER — Telehealth: Payer: Self-pay | Admitting: *Deleted

## 2019-09-07 ENCOUNTER — Telehealth: Payer: Self-pay

## 2019-09-07 ENCOUNTER — Encounter: Payer: Self-pay | Admitting: Internal Medicine

## 2019-09-07 DIAGNOSIS — G43019 Migraine without aura, intractable, without status migrainosus: Secondary | ICD-10-CM | POA: Diagnosis not present

## 2019-09-07 NOTE — Telephone Encounter (Signed)
First attempt, left VM.  

## 2019-09-07 NOTE — Telephone Encounter (Signed)
  Follow up Call-  Call back number 09/02/2019  Post procedure Call Back phone  # [phone]  Permission to leave phone message Yes  Some recent data might be hidden     Patient questions:  Do you have a fever, pain , or abdominal swelling? No. Pain Score  0 *  Have you tolerated food without any problems? Yes.    Have you been able to return to your normal activities? Yes.    Do you have any questions about your discharge instructions: Diet   No. Medications  No. Follow up visit  No.  Do you have questions or concerns about your Care? No.  Actions: * If pain score is 4 or above: 1. No action needed, pain <4.Have you developed a fever since your procedure? no  2.   Have you had an respiratory symptoms (SOB or cough) since your procedure? no  3.   Have you tested positive for COVID 19 since your procedure no  4.   Have you had any family members/close contacts diagnosed with the COVID 19 since your procedure?  no   If yes to any of these questions please route to Joylene John, RN and Erenest Rasher, RN

## 2019-09-16 DIAGNOSIS — L814 Other melanin hyperpigmentation: Secondary | ICD-10-CM | POA: Diagnosis not present

## 2019-09-16 DIAGNOSIS — L821 Other seborrheic keratosis: Secondary | ICD-10-CM | POA: Diagnosis not present

## 2019-09-16 DIAGNOSIS — D1801 Hemangioma of skin and subcutaneous tissue: Secondary | ICD-10-CM | POA: Diagnosis not present

## 2019-09-16 DIAGNOSIS — D225 Melanocytic nevi of trunk: Secondary | ICD-10-CM | POA: Diagnosis not present

## 2019-09-16 DIAGNOSIS — Z85828 Personal history of other malignant neoplasm of skin: Secondary | ICD-10-CM | POA: Diagnosis not present

## 2019-09-16 DIAGNOSIS — D2261 Melanocytic nevi of right upper limb, including shoulder: Secondary | ICD-10-CM | POA: Diagnosis not present

## 2019-09-16 DIAGNOSIS — L719 Rosacea, unspecified: Secondary | ICD-10-CM | POA: Diagnosis not present

## 2019-09-16 DIAGNOSIS — L578 Other skin changes due to chronic exposure to nonionizing radiation: Secondary | ICD-10-CM | POA: Diagnosis not present

## 2019-09-16 MED FILL — IMIPRAMINE HCL 25 MG TABLET: 25 | 30 days supply | Qty: 90 | Fill #3

## 2019-09-16 MED FILL — metroNIDAZOLE 0.75 % CREA: 0.75 | 30 days supply | Qty: 45 | Fill #0

## 2019-10-12 ENCOUNTER — Other Ambulatory Visit: Payer: Self-pay | Admitting: Nurse Practitioner

## 2019-10-12 ENCOUNTER — Other Ambulatory Visit: Payer: Self-pay | Admitting: Women's Health

## 2019-10-12 DIAGNOSIS — Z1231 Encounter for screening mammogram for malignant neoplasm of breast: Secondary | ICD-10-CM

## 2019-10-21 MED FILL — NORETHINDRONE 0.35 MG TABS: 0.35 | 84 days supply | Qty: 84 | Fill #2

## 2019-10-21 MED FILL — IMIPRAMINE HCL 25 MG TABLET: 25 | 30 days supply | Qty: 90 | Fill #0

## 2019-11-19 ENCOUNTER — Other Ambulatory Visit: Payer: 59

## 2019-11-19 ENCOUNTER — Other Ambulatory Visit: Payer: Self-pay

## 2019-11-23 MED FILL — IMIPRAMINE HCL 25 MG TABLET: 25 | 30 days supply | Qty: 90 | Fill #1

## 2020-01-11 MED FILL — NORETHINDRONE 0.35 MG TABS: 0.35 | 84 days supply | Qty: 84 | Fill #3

## 2020-01-11 MED FILL — IMIPRAMINE HCL 25 MG TABLET: 25 | 30 days supply | Qty: 90 | Fill #2

## 2020-01-18 DIAGNOSIS — Z1231 Encounter for screening mammogram for malignant neoplasm of breast: Secondary | ICD-10-CM

## 2020-02-10 MED FILL — IMIPRAMINE HCL 25 MG TABLET: 25 | 30 days supply | Qty: 90 | Fill #3

## 2020-03-07 ENCOUNTER — Other Ambulatory Visit (HOSPITAL_COMMUNITY): Payer: Self-pay | Admitting: Specialist

## 2020-03-07 DIAGNOSIS — G43019 Migraine without aura, intractable, without status migrainosus: Secondary | ICD-10-CM | POA: Diagnosis not present

## 2020-03-07 MED FILL — IMIPRAMINE HCL 25 MG TABLET: 25 | 30 days supply | Qty: 90 | Fill #0

## 2020-03-07 MED FILL — RIZATRIPTAN BENZOATE 10 MG: 10 | 30 days supply | Qty: 4 | Fill #0

## 2020-04-18 ENCOUNTER — Other Ambulatory Visit: Payer: Self-pay | Admitting: Nurse Practitioner

## 2020-04-18 ENCOUNTER — Other Ambulatory Visit: Payer: Self-pay | Admitting: *Deleted

## 2020-04-18 MED ORDER — NORETHINDRONE 0.35 MG PO TABS: 1.0000 | ORAL_TABLET | Freq: Every day | ORAL | 0 refills | Status: DC

## 2020-04-18 MED FILL — NORETHINDRONE 0.35 MG TABS: 0.35 | 84 days supply | Qty: 84 | Fill #0

## 2020-04-18 MED FILL — IMIPRAMINE HCL 25 MG TABLET: 25 | 30 days supply | Qty: 90 | Fill #1

## 2020-04-18 NOTE — Telephone Encounter (Signed)
Annual exam scheduled on 06/28/20

## 2020-05-29 MED FILL — IMIPRAMINE HCL 25 MG TABLET: 25 | 30 days supply | Qty: 90 | Fill #2

## 2020-06-28 ENCOUNTER — Encounter: Payer: 59 | Admitting: Nurse Practitioner

## 2020-07-10 ENCOUNTER — Other Ambulatory Visit: Payer: Self-pay | Admitting: Nurse Practitioner

## 2020-07-10 MED FILL — IMIPRAMINE HCL 25 MG TABLET: 25 | 30 days supply | Qty: 90 | Fill #3

## 2020-07-11 ENCOUNTER — Other Ambulatory Visit: Payer: Self-pay

## 2020-07-11 ENCOUNTER — Other Ambulatory Visit: Payer: Self-pay | Admitting: Obstetrics & Gynecology

## 2020-07-11 MED ORDER — NORETHINDRONE 0.35 MG PO TABS: 1.0000 | ORAL_TABLET | Freq: Every day | ORAL | 0 refills | Status: DC

## 2020-07-11 MED FILL — NORETHINDRONE 0.35 MG TABS: 0.35 | 84 days supply | Qty: 84 | Fill #0

## 2020-07-11 NOTE — Telephone Encounter (Signed)
AEX is schedule 09/08/20 w Dr. Marguerita Merles  Last CE 03/24/19 with [place].

## 2020-08-31 ENCOUNTER — Other Ambulatory Visit: Payer: Self-pay | Admitting: Obstetrics and Gynecology

## 2020-08-31 DIAGNOSIS — Z1231 Encounter for screening mammogram for malignant neoplasm of breast: Secondary | ICD-10-CM

## 2020-08-31 MED FILL — IMIPRAMINE HCL 25 MG TABLET: 25 | 30 days supply | Qty: 90 | Fill #4

## 2020-09-04 NOTE — Progress Notes (Signed)
55 y.o. [id number] Married Hispanic female here for annual exam.    Some months she has spotting and some months does not.  Had a 2 day cycle 4 months ago. Has migraines. Having hot flashes and night sweats, but not every day.  Runs the fan at night.   She does have headaches when she is going to have a cycle.   Wants fasting labs.   Received her Covid vaccines.  No booster.   Traveling back home to [place] to see family.  PCP:   None HA specialist:  Dr. Domingo Cocking.    No LMP recorded. (Menstrual status: Oral contraceptives).           Sexually active: Yes.    The current method of family planning is oral progesterone-only contraceptive.    Exercising: No.  The patient does not participate in regular exercise at present. Smoker:  no  Health Maintenance: Pap: 08-10-15 Neg:Neg HR HPV, 01-09-12 Neg:Neg HR HPV History of abnormal Pap:  no MMG:  01-13-19 3D/Neg/BiRads1--Appt.10/23/20 Colonoscopy: 09-02-19 multiple polyps;next 09/2022 BMD:   n/a  Result  n/a TDaP:  Unsure--Declines Gardasil:   no HIV: Neg in preg Hep C: Neg in the past Screening Labs:  Today.   reports that she has never smoked. She has never used smokeless tobacco. She reports current alcohol use of about 5.0 - 6.0 standard drinks of alcohol per week. She reports that she does not use drugs.  Past Medical History:  Diagnosis Date  . Blood transfusion without reported diagnosis 2002  . Incompetent cervix    DELIVERED AT 31 WEEKS  . Migraines   . Ruptured ectopic pregnancy 2002   WITH BLOOD TRANSFUSION 2U'S PRBC'S    Past Surgical History:  Procedure Laterality Date  . CERCLAGE PLACEMENT    . RESECTOSCOPIC POLYPECTOMY  2001  . SALPINGECTOMY     RIGHT, RUPTURED ECTOPIC  . SKIN CANCER EXCISION  2020  . tangential bx  02/22/2019    Current Outpatient Medications  Medication Sig Dispense Refill  . norethindrone (MICRONOR) 0.35 MG tablet TAKE 1 TABLET BY MOUTH ONCE A DAY 84 tablet 0  . rizatriptan (MAXALT) 10  MG tablet TAKE 1 TABLET BY MOUTH AS NEEDED FOR MIGRAINES MAY REPEAT ONCE AFTER 2 HOURS 4 tablet 5  . IMIPRAMINE HCL PO Take 1 tablet by mouth daily.      No current facility-administered medications for this visit.    Family History  Problem Relation Age of Onset  . Hyperlipidemia Father   . Colon polyps Mother   . Colon polyps Sister   . Colon polyps Brother   . Colon cancer Neg Hx   . Esophageal cancer Neg Hx   . Rectal cancer Neg Hx   . Stomach cancer Neg Hx     Review of Systems  All other systems reviewed and are negative.   Exam:   BP (!) 142/88 (Cuff Size: Large)   Pulse 90   Ht 5' 0.5" (1.537 m)   SpO2 100%   BMI 34.19 kg/m     General appearance: alert, cooperative and appears stated age Head: normocephalic, without obvious abnormality, atraumatic Neck: no adenopathy, supple, symmetrical, trachea midline and thyroid normal to inspection and palpation Lungs: clear to auscultation bilaterally Breasts: normal appearance, no masses or tenderness, No nipple retraction or dimpling, No nipple discharge or bleeding, No axillary adenopathy Heart: regular rate and rhythm Abdomen: soft, non-tender; no masses, no organomegaly Extremities: extremities normal, atraumatic, no cyanosis or edema Skin: skin color,  texture, turgor normal. No rashes or lesions Lymph nodes: cervical, supraclavicular, and axillary nodes normal. Neurologic: grossly normal  Pelvic: External genitalia:  no lesions              No abnormal inguinal nodes palpated.              Urethra:  normal appearing urethra with no masses, tenderness or lesions              Bartholins and Skenes: normal                 Vagina: normal appearing vagina with normal color and discharge, no lesions              Cervix: no lesions. Consistent with cerclage.              Pap taken: Yes.   Bimanual Exam:  Uterus:  normal size, contour, position, consistency, mobility, non-tender              Adnexa: no mass, fullness,  tenderness              Rectal exam:  Declined.  Chaperone was present for exam:  Marisa Sprinkles  Assessment:   Well woman visit with normal exam. Migraines.  Hx colon polyps.   Plan: Mammogram screening discussed. Self breast awareness reviewed. Pap and HR HPV as above. Guidelines for Calcium, Vitamin D, regular exercise program including cardiovascular and weight bearing exercise. Fasting labs today:  Lipids, CMP, CBC.  Refill of Micronor for one year.  At her convenience, she will stop her pills for 2 weeks and then return for a [place] and estradiol.  Follow up annually and prn.

## 2020-09-05 ENCOUNTER — Other Ambulatory Visit (HOSPITAL_COMMUNITY): Payer: Self-pay

## 2020-09-05 ENCOUNTER — Other Ambulatory Visit (HOSPITAL_COMMUNITY)
Admission: RE | Admit: 2020-09-05 | Discharge: 2020-09-05 | Disposition: A | Discharge status: Home / Self Care | Payer: 59 | Source: Ambulatory Visit | Attending: Obstetrics and Gynecology | Admitting: Obstetrics and Gynecology

## 2020-09-05 ENCOUNTER — Other Ambulatory Visit: Payer: Self-pay

## 2020-09-05 ENCOUNTER — Ambulatory Visit (INDEPENDENT_AMBULATORY_CARE_PROVIDER_SITE_OTHER): Payer: 59 | Admitting: Obstetrics and Gynecology

## 2020-09-05 ENCOUNTER — Encounter: Payer: Self-pay | Admitting: Obstetrics and Gynecology

## 2020-09-05 VITALS — BP 142/88 | HR 90 | Ht 60.5 in

## 2020-09-05 DIAGNOSIS — N951 Menopausal and female climacteric states: Secondary | ICD-10-CM

## 2020-09-05 DIAGNOSIS — Z01419 Encounter for gynecological examination (general) (routine) without abnormal findings: Secondary | ICD-10-CM | POA: Insufficient documentation

## 2020-09-05 DIAGNOSIS — Z124 Encounter for screening for malignant neoplasm of cervix: Secondary | ICD-10-CM | POA: Insufficient documentation

## 2020-09-05 LAB — HM PAP SMEAR

## 2020-09-05 MED ORDER — NORETHINDRONE 0.35 MG PO TABS
1.0000 | ORAL_TABLET | Freq: Every day | ORAL | 3 refills | Status: DC
  Filled 2020-09-05 – 2020-10-03 (×2): qty 84, 84d supply, fill #0
  Filled 2020-12-26: qty 84, 84d supply, fill #1
  Filled 2021-03-08: qty 84, 84d supply, fill #2

## 2020-09-05 NOTE — Patient Instructions (Signed)

## 2020-09-06 ENCOUNTER — Other Ambulatory Visit (HOSPITAL_COMMUNITY): Payer: Self-pay

## 2020-09-06 ENCOUNTER — Encounter: Payer: Self-pay | Admitting: Family Medicine

## 2020-09-06 ENCOUNTER — Ambulatory Visit (INDEPENDENT_AMBULATORY_CARE_PROVIDER_SITE_OTHER): Payer: 59 | Admitting: Family Medicine

## 2020-09-06 VITALS — BP 116/82 | Ht 60.0 in | Wt 190.0 lb

## 2020-09-06 DIAGNOSIS — M7711 Lateral epicondylitis, right elbow: Secondary | ICD-10-CM | POA: Diagnosis not present

## 2020-09-06 DIAGNOSIS — G5601 Carpal tunnel syndrome, right upper limb: Secondary | ICD-10-CM | POA: Diagnosis not present

## 2020-09-06 LAB — CYTOLOGY - PAP
Comment: NEGATIVE
Diagnosis: NEGATIVE
High risk HPV: NEGATIVE

## 2020-09-06 LAB — COMPREHENSIVE METABOLIC PANEL
AG Ratio: 1.7 (calc) (ref 1.0–2.5)
ALT: 15 U/L (ref 6–29)
AST: 13 U/L (ref 10–35)
Albumin: 4.5 g/dL (ref 3.6–5.1)
Alkaline phosphatase (APISO): 81 U/L (ref 37–153)
BUN: 15 mg/dL (ref 7–25)
CO2: 26 mmol/L (ref 20–32)
Calcium: 9.3 mg/dL (ref 8.6–10.4)
Chloride: 103 mmol/L (ref 98–110)
Creat: 0.89 mg/dL (ref 0.50–1.05)
Globulin: 2.7 g/dL (calc) (ref 1.9–3.7)
Glucose, Bld: 93 mg/dL (ref 65–99)
Potassium: 4.3 mmol/L (ref 3.5–5.3)
Sodium: 138 mmol/L (ref 135–146)
Total Bilirubin: 0.6 mg/dL (ref 0.2–1.2)
Total Protein: 7.2 g/dL (ref 6.1–8.1)

## 2020-09-06 LAB — CBC
HCT: 47.9 % — ABNORMAL HIGH (ref 35.0–45.0)
Hemoglobin: 15.7 g/dL — ABNORMAL HIGH (ref 11.7–15.5)
MCH: 27.5 pg (ref 27.0–33.0)
MCHC: 32.8 g/dL (ref 32.0–36.0)
MCV: 84 fL (ref 80.0–100.0)
MPV: 10.1 fL (ref 7.5–12.5)
Platelets: 311 10*3/uL (ref 140–400)
RBC: 5.7 10*6/uL — ABNORMAL HIGH (ref 3.80–5.10)
RDW: 13.8 % (ref 11.0–15.0)
WBC: 6.2 10*3/uL (ref 3.8–10.8)

## 2020-09-06 LAB — LIPID PANEL
Cholesterol: 203 mg/dL — ABNORMAL HIGH (ref <200)
HDL: 44 mg/dL — ABNORMAL LOW (ref >=50)
LDL Cholesterol (Calc): 138 mg/dL (calc) — ABNORMAL HIGH
Non-HDL Cholesterol (Calc): 159 mg/dL (calc) — ABNORMAL HIGH (ref <130)
Total CHOL/HDL Ratio: 4.6 (calc) (ref <5.0)
Triglycerides: 107 mg/dL (ref <150)

## 2020-09-06 MED ORDER — MELOXICAM 15 MG PO TABS
ORAL_TABLET | ORAL | 0 refills | Status: DC
  Filled 2020-09-06: qty 30, 30d supply, fill #0

## 2020-09-06 NOTE — Patient Instructions (Signed)
Thank you for coming in to see [place] today!  You have lateral epicondylitis and carpal tunnel syndrome.  Please see below to review our plan for today's visit:   For your lateral epicondylitis: 1.   Please use the wrist splint for 1 to 2 weeks and rest as much as possible. 2.   Please take meloxicam once per day for 1 week and then as needed.  You can also use topical Voltaren gel and rub it directly over the outside of your elbow where you are feeling the pain. 3.   After the first week to 2 weeks of rest, please start the home exercise program that was given to you today.  For your carpal tunnel syndrome: 1.  You may continue to treat with night bracing as you have been previously. 2.  If you have worsening, please return and we can consider alternative options.   Please call the clinic at [phone] if your symptoms worsen or you have any concerns. It was our pleasure to serve you.       Dr. Dagoberto Ligas [facility]

## 2020-09-06 NOTE — Progress Notes (Signed)
   PCP: Pcp, No  Subjective:   HPI: Patient is a 55 y.o. right-hand-dominant female here for evaluation of right wrist, forearm and elbow pain.  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  She reports that about 2 weeks ago, she started having pain over the dorsal aspect of her forearm up to her lateral epicondyle.  The pain is worse with gripping and dorsiflexion of the wrist.  It is especially painful when she is manipulating her computer mouse.  No clear inciting incident, though she was working for a long time the day prior to onset of symptoms.  She has been doing some exercises that she found online which has been somewhat helpful.  Patient also notes that she has had intermittent numbness and tingling in her hand, specifically the second and third digits, for several years.  She assumed it was carpal tunnel syndrome and has intermittently treated with wrist bracing at night.  She reports that when she wears a wrist brace at night it will calm down.  She is having some mild symptoms currently but nothing severe.  She just wanted to get it checked out as well.   Review of Systems:  Per HPI.   [place], medications and smoking status reviewed.      Objective:  Physical Exam:  No flowsheet data found.   Gen: awake, alert, NAD, comfortable in exam room Pulm: breathing unlabored  Right wrist and forearm:  Inspection: No evidence of erythema, ecchymosis, swelling, edema.  Palpation: No tenderness to palpation to distal radius/ulna, DRUJ, scaphoid, scapholunate joint, metacarpals, TFCC.  Tenderness to palpation just distal to the lateral epicondyle. ROM: Intact ROM to wrist flexion/extension, ulnar/radial deviation of the wrist.  She does have pain with active dorsiflexion of the wrist especially.  No pain with passive palmar flexion. Strength: 5/5 strength to resisted wrist flexion/extension, ulnar/radial deviation, pain with resisted dorsiflexion and with resisted extension  of ECRB. Special tests: Neg axial load to scaphoid. Neg mid-carpal shift. Neg Watson's test.  Positive Tinel's at median nerve.    Assessment & Plan:  1.  Right lateral epicondylitis Patient with signs and symptoms most consistent with lateral epicondylitis.  We discussed options for treatment and patient would like to start conservative for now which I think is very reasonable.  Plan: -Meloxicam 15 mg for 1 week then as needed -Cock-up wrist splint for 1 week, then we will start HEP focusing on eccentric wrist extension that was given today -Can continue use elbow sleeve if it is helpful -Follow-up as needed if not improving with these measures  2.  Right carpal tunnel syndrome Signs and symptoms most consistent with carpal tunnel syndrome.  She currently is managing it effectively with intermittent night bracing which I think is reasonable.  We will plan to continue this for now, she knows that she can follow-up for more aggressive measures if it does not improve with current therapy.   Dagoberto Ligas, MD [facility] Fellow 09/06/2020 2:20 PM  Addendum:  I was the preceptor for this visit and available for immediate consultation.  Karlton Lemon MD [facility]

## 2020-09-08 ENCOUNTER — Ambulatory Visit: Payer: 59 | Admitting: Obstetrics & Gynecology

## 2020-09-13 ENCOUNTER — Other Ambulatory Visit (HOSPITAL_COMMUNITY): Payer: Self-pay

## 2020-09-13 DIAGNOSIS — G43019 Migraine without aura, intractable, without status migrainosus: Secondary | ICD-10-CM | POA: Diagnosis not present

## 2020-09-13 MED ORDER — IMIPRAMINE HCL 25 MG PO TABS
ORAL_TABLET | ORAL | 5 refills | Status: DC
  Filled 2020-09-13 – 2020-10-04 (×2): qty 90, 30d supply, fill #0
  Filled 2020-11-15: qty 90, 30d supply, fill #1
  Filled 2020-12-26: qty 90, 30d supply, fill #2
  Filled 2021-05-04: qty 90, 30d supply, fill #3
  Filled 2021-06-06: qty 90, 30d supply, fill #4
  Filled 2021-07-11: qty 90, 30d supply, fill #5

## 2020-09-16 ENCOUNTER — Other Ambulatory Visit (HOSPITAL_COMMUNITY): Payer: Self-pay

## 2020-09-20 ENCOUNTER — Encounter: Payer: Self-pay | Admitting: Obstetrics and Gynecology

## 2020-09-20 ENCOUNTER — Other Ambulatory Visit: Payer: Self-pay | Admitting: Obstetrics and Gynecology

## 2020-09-20 DIAGNOSIS — D582 Other hemoglobinopathies: Secondary | ICD-10-CM

## 2020-09-20 NOTE — Progress Notes (Signed)
Review of blood counts with patient.  She hemoglobin is elevated and has trended upward. She will increase hydration and will recheck her CBC in 2 months.  Order placed for future CBC.  If hemoglobin remains high, I would recommend consultation with hematologist.

## 2020-09-27 ENCOUNTER — Other Ambulatory Visit (HOSPITAL_COMMUNITY): Payer: Self-pay

## 2020-10-04 ENCOUNTER — Other Ambulatory Visit (HOSPITAL_COMMUNITY): Payer: Self-pay

## 2020-10-23 ENCOUNTER — Ambulatory Visit: Payer: 59

## 2020-11-01 ENCOUNTER — Other Ambulatory Visit: Payer: Self-pay

## 2020-11-01 ENCOUNTER — Telehealth: Payer: Self-pay

## 2020-11-01 DIAGNOSIS — M7989 Other specified soft tissue disorders: Secondary | ICD-10-CM

## 2020-11-01 NOTE — Telephone Encounter (Signed)
Staff message from Dr. Marguerita Merles :  Left leg oedema with a knot at the back of the knee and pain towards the calf. Left lower limb doppler to r/o DVT  Called [facility] but they have no openings today not even stat openings.  Left message at [place].

## 2020-11-01 NOTE — Telephone Encounter (Signed)
Appointment scheduled for tomorrow 11/02/20 at 9:00am at [facility].  Patient informed.

## 2020-11-02 ENCOUNTER — Ambulatory Visit (HOSPITAL_COMMUNITY)
Admission: RE | Admit: 2020-11-02 | Discharge: 2020-11-02 | Disposition: A | Discharge status: Home / Self Care | Payer: 59 | Source: Ambulatory Visit | Attending: Obstetrics & Gynecology | Admitting: Obstetrics & Gynecology

## 2020-11-02 ENCOUNTER — Other Ambulatory Visit: Payer: Self-pay

## 2020-11-02 DIAGNOSIS — M7989 Other specified soft tissue disorders: Secondary | ICD-10-CM | POA: Diagnosis not present

## 2020-11-02 NOTE — Progress Notes (Signed)
Left lower extremity venous duplex has been completed. Preliminary results can be found in CV Proc through chart review.  Results were given to Dr. Dellis Filbert.  11/02/20 9:03 AM Carlos Levering RVT

## 2020-11-15 ENCOUNTER — Ambulatory Visit (INDEPENDENT_AMBULATORY_CARE_PROVIDER_SITE_OTHER): Payer: 59 | Admitting: Family Medicine

## 2020-11-15 ENCOUNTER — Ambulatory Visit: Payer: Self-pay

## 2020-11-15 ENCOUNTER — Other Ambulatory Visit (HOSPITAL_COMMUNITY): Payer: Self-pay

## 2020-11-15 ENCOUNTER — Other Ambulatory Visit: Payer: Self-pay

## 2020-11-15 ENCOUNTER — Encounter: Payer: Self-pay | Admitting: Family Medicine

## 2020-11-15 VITALS — BP 145/88 | Ht 60.0 in | Wt 190.0 lb

## 2020-11-15 DIAGNOSIS — M25562 Pain in left knee: Secondary | ICD-10-CM

## 2020-11-15 MED ORDER — METHYLPREDNISOLONE ACETATE 40 MG/ML IJ SUSP
40.0000 mg | Freq: Once | INTRAMUSCULAR | Status: AC
  Administered 2020-11-15: 40 mg via INTRA_ARTICULAR

## 2020-11-15 MED ORDER — MELOXICAM 15 MG PO TABS
15.0000 mg | ORAL_TABLET | Freq: Every day | ORAL | 0 refills | Status: DC | PRN
  Filled 2020-11-15: qty 30, 30d supply, fill #0

## 2020-11-15 NOTE — Progress Notes (Signed)
PCP: Pcp, No  Subjective:   HPI: Patient is a 55 y.o. female here for evaluation of left knee pain.  She reports that she has had intermittent left knee pain for several years, but over the last few months she has had progressively worsening pain mostly over her lateral knee and extending distally into her lower leg.  No significant new trauma or injury.  No new activities.  Couple weeks ago at the beginning of June, she noted to her colleagues at the gynecology office that she was having leg/knee pain and at that time was having significant swelling, there was concern for DVT so Doppler was ordered and was normal, and it was noted during that exam that she has a Baker's cyst.  Since then, her swelling has gone down significantly but she does continue to have mostly anterior and lateral knee pain.  She notes that meloxicam seems to be helpful for the pain.  It is more sore at the end of the day after prolonged walking or standing.  She has some stiffness in the morning.  No numbness or tingling, no weakness.   Review of Systems:  Per HPI.   [place], medications and smoking status reviewed.      Objective:  Physical Exam:  No flowsheet data found.   Gen: awake, alert, NAD, comfortable in exam room Pulm: breathing unlabored  Left knee  Inspection: Bilateral knees without evidence of erythema, ecchymosis, swelling, edema.  Trace effusion present. Active ROM: Intact.  5-150 degrees, with pain with deep flexion. Strength: 5/5 strength to resisted flexion/extension without pain  Patella: Negative patellar grind. No patellar facet tenderness. No apprehension. No proximal or distal patellar tendon tenderness to palpation. No quad tendon tenderness to palpation.  Tibia: No tibial plateau, tibial tuberosity tenderness.  Joint line: + Medial and lateral joint line tenderness Popliteal: Minimal popliteal tenderness to the insertional gastroc. No insertional biceps femoris, semimembranosis,  semitendinosis tenderness.  McMurrays/Thessaly's: Positive for pain without catching  Lachmans: Stable bilaterally with firm endpoint  Anterior/Posterior drawer: Stable bilaterally Varus/valgus stress at 0, 15d: Negative for pain, laxity  Ultrasound examination of the left knee: -Suprapatellar pouch: Visualized in both long and short axis and with mild to moderate effusion -Quadriceps tendon: Insertion onto superior pole patella visualized and without significant abnormality -Patella: Well visualized and without any signs of prepatellar bursitis or patellar fracture. -Medial joint line: Medial meniscus visualized and without any obvious abnormalities, there is decreased joint space and spurring at the medial joint line. -Lateral joint line: Lateral meniscus visualized and without any obvious abnormalities, there is decreased joint space and spurring at the lateral joint line. -Posterior knee: Medial and lateral heads of the gastrocnemius well visualized, neurovascular bundle visualized and without any abnormalities.  There is a moderately sized popliteal cyst.  Impression: -Signs of medial and lateral osteoarthritis -Joint effusion in the suprapatellar pouch and Baker's cyst, suggestive of intra-articular inflammation.  Ultrasound performed interpreted by Dagoberto Ligas, MD and Karlton Lemon, MD.     Assessment & Plan:  1.  Left knee osteoarthritis 2.  Left knee Baker's cyst Patient with signs and symptoms consistent with left knee osteoarthritis.  She has a known family history of this and ultrasound findings are suggestive of this as well.  We discussed that the Baker's cyst is often a sign of intra-articular inflammation and not necessarily because of pain.  Given that she is not having significant posterior pain I would not recommend Baker's cyst aspiration at this time.  We discussed options and she elected to proceed with CSI.  Plan: -Left knee CSI performed today -X-ray left knee  with weightbearing views to evaluate extent of arthritis -Continue meloxicam as needed -Can try Voltaren gel -HEP with focus on hip abduction strengthening and quadricep strengthening -Discussed that activity, including weightbearing activities not only allowed but encouraged.  Dispelled myths about weightbearing activity worsening OA.   Procedure note: After informed written consent timeout was performed, patient was seated on exam table. Right knee was prepped with alcohol swab and utilizing anteromedial approach, patient's right knee was injected intraarticularly with 3:1 bupivicaine: depomedrol. Patient tolerated the procedure well without immediate complications.    Dagoberto Ligas, MD [facility] 11/15/2020 5:11 PM  Addendum:  I was the preceptor for this visit and available for immediate consultation.  Karlton Lemon MD [facility]

## 2020-11-16 ENCOUNTER — Other Ambulatory Visit (HOSPITAL_COMMUNITY): Payer: Self-pay

## 2020-11-22 ENCOUNTER — Ambulatory Visit
Admission: RE | Admit: 2020-11-22 | Discharge: 2020-11-22 | Disposition: A | Discharge status: Home / Self Care | Payer: 59 | Source: Ambulatory Visit | Attending: Family Medicine | Admitting: Family Medicine

## 2020-11-22 DIAGNOSIS — M170 Bilateral primary osteoarthritis of knee: Secondary | ICD-10-CM | POA: Diagnosis not present

## 2020-11-22 DIAGNOSIS — M1712 Unilateral primary osteoarthritis, left knee: Secondary | ICD-10-CM | POA: Diagnosis not present

## 2020-11-22 DIAGNOSIS — M25562 Pain in left knee: Secondary | ICD-10-CM

## 2020-12-26 ENCOUNTER — Other Ambulatory Visit (HOSPITAL_COMMUNITY): Payer: Self-pay

## 2021-01-16 ENCOUNTER — Other Ambulatory Visit: Payer: Self-pay | Admitting: Obstetrics and Gynecology

## 2021-01-16 ENCOUNTER — Other Ambulatory Visit: Payer: 59

## 2021-01-16 DIAGNOSIS — D582 Other hemoglobinopathies: Secondary | ICD-10-CM

## 2021-01-16 LAB — CBC
HCT: 48.4 % — ABNORMAL HIGH (ref 35.0–45.0)
Hemoglobin: 15.6 g/dL — ABNORMAL HIGH (ref 11.7–15.5)
MCH: 27.4 pg (ref 27.0–33.0)
MCHC: 32.2 g/dL (ref 32.0–36.0)
MCV: 85.1 fL (ref 80.0–100.0)
MPV: 10.6 fL (ref 7.5–12.5)
Platelets: 345 10*3/uL (ref 140–400)
RBC: 5.69 10*6/uL — ABNORMAL HIGH (ref 3.80–5.10)
RDW: 13.5 % (ref 11.0–15.0)
WBC: 7.7 10*3/uL (ref 3.8–10.8)

## 2021-01-20 ENCOUNTER — Encounter: Payer: Self-pay | Admitting: Obstetrics and Gynecology

## 2021-01-20 DIAGNOSIS — R718 Other abnormality of red blood cells: Secondary | ICD-10-CM

## 2021-01-20 DIAGNOSIS — D582 Other hemoglobinopathies: Secondary | ICD-10-CM

## 2021-01-23 ENCOUNTER — Telehealth: Payer: Self-pay | Admitting: Hematology and Oncology

## 2021-01-23 NOTE — Telephone Encounter (Signed)
Received a new hem referral from Dr. Quincy Simmonds for  elevated hgb. Ms. Gabrielle Cross has been cld and scheduled to see Dr. Lindi Adie on 9/22 at 345pm. Pt aware to arrive 15 minutes early.

## 2021-02-21 NOTE — Progress Notes (Signed)
[facility] CONSULT NOTE  Patient Care Team: Pcp, No as PCP - General  CHIEF COMPLAINTS/PURPOSE OF CONSULTATION:  Newly diagnosed elevated hemoglobin  HISTORY OF PRESENTING ILLNESS:  Gabrielle Cross 55 y.o. female is here because of recent diagnosis of elevated hemoglobin. Labs on 01/18/2021 showed Hg 15.6 and HCT 48.4. She presents to the clinic today for initial evaluation and discussion of treatment options.  She feels somewhat fatigued and occasional shortness of breath.  Denies any headaches or blurred vision or lightheadedness or dizziness.  I reviewed her records extensively and collaborated the history with the patient.  MEDICAL HISTORY:  Past Medical History:  Diagnosis Date   Blood transfusion without reported diagnosis 2002   Elevated hemoglobin ([place])    Incompetent cervix    DELIVERED AT 31 WEEKS   Migraines    Ruptured ectopic pregnancy 2002   WITH BLOOD TRANSFUSION 2U'S PRBC'S    SURGICAL HISTORY: Past Surgical History:  Procedure Laterality Date   CERCLAGE PLACEMENT     RESECTOSCOPIC POLYPECTOMY  2001   SALPINGECTOMY     RIGHT, RUPTURED ECTOPIC   SKIN CANCER EXCISION  2020   tangential bx  02/22/2019    SOCIAL HISTORY: Social History   Socioeconomic History   Marital status: Married    Spouse name: Not on file   Number of children: Not on file   Years of education: Not on file   Highest education level: Not on file  Occupational History   Not on file  Tobacco Use   Smoking status: Never   Smokeless tobacco: Never  Vaping Use   Vaping Use: Never used  Substance and Sexual Activity   Alcohol use: Yes    Alcohol/week: 5.0 - 6.0 standard drinks    Types: 5 - 6 Standard drinks or equivalent per week    Comment: weekends - social    Drug use: No   Sexual activity: Yes    Partners: Male    Birth control/protection: Pill    Comment: intercoure age 28, less than 5 sexual partners, des neg  Other Topics Concern   Not on file   Social History Narrative   Not on file   Social Determinants of Health   Financial Resource Strain: Not on file  Food Insecurity: Not on file  Transportation Needs: Not on file  Physical Activity: Not on file  Stress: Not on file  Social Connections: Not on file  Intimate Partner Violence: Not on file    FAMILY HISTORY: Family History  Problem Relation Age of Onset   Hyperlipidemia Father    Colon polyps Mother    Colon polyps Sister    Colon polyps Brother    Colon cancer Neg Hx    Esophageal cancer Neg Hx    Rectal cancer Neg Hx    Stomach cancer Neg Hx     ALLERGIES:  is allergic to shellfish allergy.  MEDICATIONS:  Current Outpatient Medications  Medication Sig Dispense Refill   imipramine (TOFRANIL) 25 MG tablet Take 3 tablets by mouth daily. 90 tablet 5   IMIPRAMINE HCL PO Take 1 tablet by mouth daily.     meloxicam (MOBIC) 15 MG tablet Take 1 tablet (15 mg total) by mouth daily as needed for pain. 30 tablet 0   norethindrone (MICRONOR) 0.35 MG tablet TAKE 1 TABLET BY MOUTH ONCE A DAY 84 tablet 3   rizatriptan (MAXALT) 10 MG tablet TAKE 1 TABLET BY MOUTH AS NEEDED FOR MIGRAINES MAY REPEAT ONCE AFTER 2  HOURS 4 tablet 5   No current facility-administered medications for this visit.    REVIEW OF SYSTEMS:   Constitutional: Denies fevers, chills or abnormal night sweats   All other systems were reviewed with the patient and are negative.  PHYSICAL EXAMINATION: ECOG PERFORMANCE STATUS: 1 - Symptomatic but completely ambulatory  Vitals:   02/22/21 1537  BP: (!) 147/61  Pulse: 96  Resp: 18  Temp: 97.7 F (36.5 C)  SpO2: 99%   Filed Weights   02/22/21 1537  Weight: 180 lb 8 oz (81.9 kg)       LABORATORY DATA:  I have reviewed the data as listed Lab Results  Component Value Date   WBC 7.7 01/16/2021   HGB 15.6 (H) 01/16/2021   HCT 48.4 (H) 01/16/2021   MCV 85.1 01/16/2021   PLT 345 01/16/2021   Lab Results  Component Value Date   NA 138  09/05/2020   K 4.3 09/05/2020   CL 103 09/05/2020   CO2 26 09/05/2020    RADIOGRAPHIC STUDIES: I have personally reviewed the radiological reports and agreed with the findings in the report.  ASSESSMENT AND PLAN:  Erythrocytosis Lab review: 01/16/2021: Hemoglobin 15.6, hematocrit 48.4, WBC 7.7, platelets 345 Patient has had stable but elevated hemoglobin over the last several years.  I discussed with the patient extensively the differential diagnosis of polycythemia 1. Primary polycythemia due to clonal stem cell abnormality 2. Secondary polycythemia due to cause that include hypoxia, heart or lung problems, altitude, obstructive sleep apnea, bronchial asthma, erythropoietin producing lesion/tumors etc  Recommendation: 1. JAK-2 mutation testing to evaluate polycythemia vera 2. Erythropoietin level 3.  Sleep study 4.  Pulmonary function testing to evaluate for asthma    Indications for phlebotomy 1. Primary polycythemia with hematocrit over 55 2. Secondary polycythemia with severe symptoms which include strokelike symptoms, severe recurrent headaches, severe fatigue.  Patient does not have any clear-cut symptoms that would require phlebotomy at this time.   I will request Dr. Quincy Simmonds to help with obtaining the sleep study and pulmonary function tests. Labs today and follow-up in 2 weeks to discuss results.   All questions were answered. The patient knows to call the clinic with any problems, questions or concerns.   Rulon Eisenmenger, MD, MPH 02/22/2021    I, Thana Ates, am acting as scribe for Nicholas Lose, MD.  I have reviewed the above documentation for accuracy and completeness, and I agree with the above.

## 2021-02-22 ENCOUNTER — Other Ambulatory Visit: Payer: Self-pay

## 2021-02-22 ENCOUNTER — Inpatient Hospital Stay: Payer: 59 | Attending: Hematology and Oncology | Admitting: Hematology and Oncology

## 2021-02-22 ENCOUNTER — Inpatient Hospital Stay: Payer: 59

## 2021-02-22 VITALS — BP 147/61 | HR 96 | Temp 97.7°F | Resp 18 | Ht 60.0 in | Wt 180.5 lb

## 2021-02-22 DIAGNOSIS — Z8371 Family history of colonic polyps: Secondary | ICD-10-CM | POA: Insufficient documentation

## 2021-02-22 DIAGNOSIS — D751 Secondary polycythemia: Secondary | ICD-10-CM | POA: Diagnosis not present

## 2021-02-22 LAB — CBC WITH DIFFERENTIAL (CANCER CENTER ONLY)
Abs Immature Granulocytes: 0.03 10*3/uL (ref 0.00–0.07)
Basophils Absolute: 0 10*3/uL (ref 0.0–0.1)
Basophils Relative: 0 %
Eosinophils Absolute: 0.1 10*3/uL (ref 0.0–0.5)
Eosinophils Relative: 1 %
HCT: 44.8 % (ref 36.0–46.0)
Hemoglobin: 14.5 g/dL (ref 12.0–15.0)
Immature Granulocytes: 0 %
Lymphocytes Relative: 25 %
Lymphs Abs: 2.6 10*3/uL (ref 0.7–4.0)
MCH: 27.6 pg (ref 26.0–34.0)
MCHC: 32.4 g/dL (ref 30.0–36.0)
MCV: 85.2 fL (ref 80.0–100.0)
Monocytes Absolute: 0.6 10*3/uL (ref 0.1–1.0)
Monocytes Relative: 6 %
Neutro Abs: 7 10*3/uL (ref 1.7–7.7)
Neutrophils Relative %: 68 %
Platelet Count: 325 10*3/uL (ref 150–400)
RBC: 5.26 MIL/uL — ABNORMAL HIGH (ref 3.87–5.11)
RDW: 14.6 % (ref 11.5–15.5)
WBC Count: 10.3 10*3/uL (ref 4.0–10.5)
nRBC: 0 % (ref 0.0–0.2)

## 2021-02-22 NOTE — Assessment & Plan Note (Signed)
Lab review: 01/16/2021: Hemoglobin 15.6, hematocrit 48.4, WBC 7.7, platelets 345 Patient has had stable but elevated hemoglobin over the last several years.  I discussed with the patient extensively the differential diagnosis of polycythemia 1. Primary polycythemia due to clonal stem cell abnormality 2. Secondary polycythemia due to cause that include hypoxia, heart or lung problems, altitude, obstructive sleep apnea, bronchial asthma, erythropoietin producing lesion/tumors etc  Recommendation: 1. JAK-2 mutation testing to evaluate polycythemia vera 2. Erythropoietin level 3.  Sleep study 4.  Pulmonary function testing to evaluate for asthma    Indications for phlebotomy 1. Primary polycythemia with hematocrit over 55 2. Secondary polycythemia with severe symptoms which include strokelike symptoms, severe recurrent headaches, severe fatigue.  Patient does not have any clear-cut symptoms that would require phlebotomy at this time.   I will request Dr. Quincy Simmonds to help with obtaining the sleep study and pulmonary function tests. Labs today and follow-up in 2 weeks to discuss results.

## 2021-02-23 LAB — ERYTHROPOIETIN: Erythropoietin: 11.3 m[IU]/mL (ref 2.6–18.5)

## 2021-02-25 ENCOUNTER — Telehealth: Payer: Self-pay | Admitting: Obstetrics and Gynecology

## 2021-02-25 NOTE — Telephone Encounter (Signed)
I received a consultation note from Dr. Lindi Adie regarding patient's elevated hemoglobin.   The oncologist requested my assistance in setting up a sleep study and pulmonary function studies for [facility].   I recommend establishing care with a PCP for these tests and follow up.  Her symptoms for the referral are fatigue and shortness of breath.

## 2021-02-26 NOTE — Telephone Encounter (Signed)
My chart message sent to patient regarding the below.

## 2021-03-05 LAB — JAK2 (INCLUDING V617F AND EXON 12), MPL,& CALR W/RFL MPN PANEL (NGS)

## 2021-03-07 NOTE — Progress Notes (Signed)
  HEMATOLOGY-ONCOLOGY TELEPHONE VISIT PROGRESS NOTE  I connected with Gabrielle Cross on 03/08/2021 at  3:00 PM EDT by telephone and verified that I am speaking with the correct person using two identifiers.  I discussed the limitations, risks, security and privacy concerns of performing an evaluation and management service by telephone and the availability of in person appointments.  I also discussed with the patient that there may be a patient responsible charge related to this service. The patient expressed understanding and agreed to proceed.   History of Present Illness: Gabrielle Cross is a 55 y.o. female with above-mentioned history of elevated hemoglobin. Labs on 02/22/2021 showed Hg 14.5 and HCT 44.8. She presents via telephone today for follow-up.  Observations/Objective:     Assessment Plan:  Erythrocytosis Lab review: 01/16/2021: Hemoglobin 15.6, hematocrit 48.4, WBC 7.7, platelets 345 02/22/2021: Hemoglobin 14.5, hematocrit 44.8, WBC 10.3, platelets 325,  JAK2/MPN panel: No mutations Erythropoietin:11.3  Patient has had stable but elevated hemoglobin over the last several years. The slightly elevated hemoglobin that he had previously was related to secondary erythrocytosis. There is clearly no bone marrow stem cell disorder. Sleep study and pulmonary function testing for evaluation of asthma could be beneficial. No indication for phlebotomy.  Return to clinic in 1 year for follow-up    I discussed the assessment and treatment plan with the patient. The patient was provided an opportunity to ask questions and all were answered. The patient agreed with the plan and demonstrated an understanding of the instructions. The patient was advised to call back or seek an in-person evaluation if the symptoms worsen or if the condition fails to improve as anticipated.   Total time spent: 12 mins including non-face to face time and time spent for planning, charting and coordination of  care  Rulon Eisenmenger, MD 03/08/2021    I, Thana Ates, am acting as scribe for Nicholas Lose, MD.  I have reviewed the above documentation for accuracy and completeness, and I agree with the above.

## 2021-03-08 ENCOUNTER — Ambulatory Visit (HOSPITAL_BASED_OUTPATIENT_CLINIC_OR_DEPARTMENT_OTHER): Payer: 59 | Admitting: Hematology and Oncology

## 2021-03-08 ENCOUNTER — Other Ambulatory Visit (HOSPITAL_COMMUNITY): Payer: Self-pay

## 2021-03-08 DIAGNOSIS — D751 Secondary polycythemia: Secondary | ICD-10-CM

## 2021-03-08 DIAGNOSIS — G43019 Migraine without aura, intractable, without status migrainosus: Secondary | ICD-10-CM | POA: Diagnosis not present

## 2021-03-08 MED ORDER — RIZATRIPTAN BENZOATE 10 MG PO TABS
ORAL_TABLET | ORAL | 5 refills | Status: DC
  Filled 2021-03-08: qty 4, 14d supply, fill #0

## 2021-03-08 MED ORDER — IMIPRAMINE HCL 25 MG PO TABS
ORAL_TABLET | ORAL | 5 refills | Status: DC
  Filled 2021-03-08: qty 90, 30d supply, fill #0

## 2021-03-08 NOTE — Assessment & Plan Note (Signed)
Lab review: 01/16/2021: Hemoglobin 15.6, hematocrit 48.4, WBC 7.7, platelets 345 02/22/2021: Hemoglobin 14.5, hematocrit 44.8, WBC 10.3, platelets 325,  JAK2/MPN panel: No mutations Erythropoietin:11.3  Patient has had stable but elevated hemoglobin over the last several years. The slightly elevated hemoglobin that he had previously was related to secondary erythrocytosis. There is clearly no bone marrow stem cell disorder. Sleep study and pulmonary function testing for evaluation of asthma could be beneficial. No indication for phlebotomy.  Return to clinic in 1 year for follow-up

## 2021-03-09 ENCOUNTER — Other Ambulatory Visit (HOSPITAL_COMMUNITY): Payer: Self-pay

## 2021-04-18 ENCOUNTER — Telehealth: Payer: Self-pay | Admitting: *Deleted

## 2021-04-18 ENCOUNTER — Other Ambulatory Visit: Payer: Self-pay

## 2021-04-18 ENCOUNTER — Encounter: Payer: Self-pay | Admitting: Internal Medicine

## 2021-04-18 ENCOUNTER — Ambulatory Visit: Payer: 59 | Admitting: Internal Medicine

## 2021-04-18 VITALS — BP 144/86 | HR 98 | Temp 98.4°F | Resp 16 | Ht 60.0 in | Wt 177.0 lb

## 2021-04-18 DIAGNOSIS — Z1231 Encounter for screening mammogram for malignant neoplasm of breast: Secondary | ICD-10-CM | POA: Insufficient documentation

## 2021-04-18 DIAGNOSIS — Z0001 Encounter for general adult medical examination with abnormal findings: Secondary | ICD-10-CM

## 2021-04-18 DIAGNOSIS — E559 Vitamin D deficiency, unspecified: Secondary | ICD-10-CM | POA: Diagnosis not present

## 2021-04-18 DIAGNOSIS — Z23 Encounter for immunization: Secondary | ICD-10-CM | POA: Diagnosis not present

## 2021-04-18 DIAGNOSIS — I10 Essential (primary) hypertension: Secondary | ICD-10-CM | POA: Diagnosis not present

## 2021-04-18 DIAGNOSIS — Z0000 Encounter for general adult medical examination without abnormal findings: Secondary | ICD-10-CM

## 2021-04-18 DIAGNOSIS — R0683 Snoring: Secondary | ICD-10-CM | POA: Diagnosis not present

## 2021-04-18 LAB — BASIC METABOLIC PANEL
BUN: 15 mg/dL (ref 6–23)
CO2: 24 mEq/L (ref 19–32)
Calcium: 9.8 mg/dL (ref 8.4–10.5)
Chloride: 101 mEq/L (ref 96–112)
Creatinine, Ser: 0.84 mg/dL (ref 0.40–1.20)
GFR: 79.37 mL/min (ref >60.00)
Glucose, Bld: 91 mg/dL (ref 70–99)
Potassium: 3.9 mEq/L (ref 3.5–5.1)
Sodium: 135 mEq/L (ref 135–145)

## 2021-04-18 LAB — VITAMIN D 25 HYDROXY (VIT D DEFICIENCY, FRACTURES): VITD: 29.54 ng/mL — ABNORMAL LOW (ref 30.00–100.00)

## 2021-04-18 LAB — TSH: TSH: 0.72 u[IU]/mL (ref 0.35–5.50)

## 2021-04-18 NOTE — Telephone Encounter (Addendum)
Spoke with patient and she will have labs done at PCP office. Reports the lab tech was not there at the time of her visit with PCP.

## 2021-04-18 NOTE — Telephone Encounter (Signed)
Patient called had appointment with PCP today and he ordered labs. Patient asked if labs could be done drawn here at this office?  Please advise

## 2021-04-18 NOTE — Progress Notes (Signed)
Subjective:  Patient ID: Gabrielle Cross, female    DOB: [birth year]  Age: 55 y.o. MRN: [id number]  CC: Annual Exam and Hypertension  This visit occurred during the SARS-CoV-2 public health emergency.  Safety protocols were in place, including screening questions prior to the visit, additional usage of staff PPE, and extensive cleaning of exam room while observing appropriate contact time as indicated for disinfecting solutions.    HPI Gabrielle Cross presents for a CPX and to establish.  She has been evaluated for erythrocytosis and was told to have a sleep study. Her husband has complained about her snoring and likely apnea for years. She has OA in her knees, chronic headaches, fatigue, and elevated BP.  History Gabrielle Cross has a past medical history of Blood transfusion without reported diagnosis (2002), Elevated hemoglobin ([place]), Incompetent cervix, Migraines, and Ruptured ectopic pregnancy (2002).   She has a past surgical history that includes CERCLAGE PLACEMENT; Salpingectomy; RESECTOSCOPIC POLYPECTOMY (2001); tangential bx (02/22/2019); and Skin cancer excision (2020).   Her family history includes Colon polyps in her brother, mother, and sister; Hyperlipidemia in her father.She reports that she has never smoked. She has never used smokeless tobacco. She reports current alcohol use of about 6.0 standard drinks per week. She reports that she does not use drugs.  Outpatient Medications Prior to Visit  Medication Sig Dispense Refill   imipramine (TOFRANIL) 25 MG tablet Take 3 tablets by mouth daily. 90 tablet 5   meloxicam (MOBIC) 15 MG tablet Take 1 tablet (15 mg total) by mouth daily as needed for pain. 30 tablet 0   norethindrone (MICRONOR) 0.35 MG tablet TAKE 1 TABLET BY MOUTH ONCE A DAY 84 tablet 3   rizatriptan (MAXALT) 10 MG tablet TAKE 1 TABLET BY MOUTH AS NEEDED FOR MIGRAINES MAY REPEAT ONCE AFTER 2 HOURS 4 tablet 5   imipramine (TOFRANIL) 25 MG tablet Take 3 tablets by mouth daily  for 30 days 90 tablet 5   IMIPRAMINE HCL PO Take 1 tablet by mouth daily.     rizatriptan (MAXALT) 10 MG tablet Take 1 by mouth as needed for migraine.  May repeat once after 2 hours 4 tablet 5   No facility-administered medications prior to visit.    ROS Review of Systems  Constitutional:  Positive for fatigue. Negative for unexpected weight change.  HENT: Negative.    Eyes:  Negative for visual disturbance.  Respiratory:  Positive for apnea. Negative for chest tightness, shortness of breath and wheezing.   Cardiovascular:  Negative for chest pain, palpitations and leg swelling.  Gastrointestinal:  Negative for abdominal pain, constipation, diarrhea, nausea and vomiting.  Endocrine: Negative.   Genitourinary: Negative.  Negative for difficulty urinating, dysuria and hematuria.  Musculoskeletal:  Positive for arthralgias. Negative for back pain and myalgias.  Skin: Negative.   Neurological:  Negative for dizziness, weakness, light-headedness, numbness and headaches.  Hematological:  Negative for adenopathy. Does not bruise/bleed easily.  Psychiatric/Behavioral: Negative.     Objective:  BP (!) 144/86 (BP Location: Right Arm, Patient Position: Sitting, Cuff Size: Large)   Pulse 98   Temp 98.4 F (36.9 C) (Oral)   Resp 16   Ht 5' (1.524 m)   Wt 177 lb (80.3 kg)   LMP 04/04/2021 (Exact Date)   SpO2 99%   BMI 34.57 kg/m   Physical Exam Vitals reviewed.  Constitutional:      Appearance: Normal appearance.  HENT:     Nose: Nose normal.     Mouth/Throat:  Mouth: Mucous membranes are moist.  Eyes:     General: No scleral icterus.    Conjunctiva/sclera: Conjunctivae normal.  Cardiovascular:     Rate and Rhythm: Normal rate and regular rhythm.     Heart sounds: No murmur heard.    Comments: EKG- NSR, 89 bpm Low voltage No LVH or Q waves Pulmonary:     Effort: Pulmonary effort is normal.     Breath sounds: No stridor. No wheezing, rhonchi or rales.  Abdominal:      General: Abdomen is flat.     Palpations: There is no mass.     Tenderness: There is no abdominal tenderness. There is no guarding.     Hernia: No hernia is present.  Musculoskeletal:        General: Normal range of motion.     Cervical back: Neck supple.     Right lower leg: No edema.     Left lower leg: No edema.  Lymphadenopathy:     Cervical: No cervical adenopathy.  Skin:    General: Skin is warm and dry.     Findings: No rash.  Neurological:     General: No focal deficit present.     Mental Status: She is alert and oriented to person, place, and time. Mental status is at baseline.  Psychiatric:        Mood and Affect: Mood normal.        Behavior: Behavior normal.    Lab Results  Component Value Date   WBC 10.3 02/22/2021   HGB 14.5 02/22/2021   HCT 44.8 02/22/2021   PLT 325 02/22/2021   GLUCOSE 91 04/18/2021   CHOL 203 (H) 09/05/2020   TRIG 107 09/05/2020   HDL 44 (L) 09/05/2020   LDLCALC 138 (H) 09/05/2020   ALT 15 09/05/2020   AST 13 09/05/2020   NA 135 04/18/2021   K 3.9 04/18/2021   CL 101 04/18/2021   CREATININE 0.84 04/18/2021   BUN 15 04/18/2021   CO2 24 04/18/2021   TSH 0.72 04/18/2021     Assessment & Plan:   Quierra was seen today for annual exam and hypertension.  Diagnoses and all orders for this visit:  Loud snoring -     Ambulatory referral to Sleep Studies  Primary hypertension- Labs and EKG are negative for end organ damage or secondary causes. She is not willing to take a BP med but will work on her lifestyle modifications.  -     EKG 12-Lead -     TSH; Future -     VITAMIN D 25 Hydroxy (Vit-D Deficiency, Fractures); Future -     Basic metabolic panel; Future -     Urinalysis, Routine w reflex microscopic; Future -     Urinalysis, Routine w reflex microscopic -     Basic metabolic panel -     VITAMIN D 25 Hydroxy (Vit-D Deficiency, Fractures) -     TSH  Encounter for general adult medical examination with abnormal findings- Exam  completed, labs reviewed - statin tx is not indicated, vaccines reviewed/updated, cancer screenings addressed, pt ed material was given.  Visit for screening mammogram -     MM DIGITAL SCREENING BILATERAL; Future  Vitamin D deficiency -     Cholecalciferol 50 MCG (2000 UT) TABS; Take 1 tablet (2,000 Units total) by mouth daily.  Other orders -     Tdap vaccine greater than or equal to 7yo IM  I have discontinued Hilary Klooster's IMIPRAMINE HCL  PO. I am also having her start on Cholecalciferol. Additionally, I am having her maintain her rizatriptan, norethindrone, imipramine, and meloxicam.  Meds ordered this encounter  Medications   Cholecalciferol 50 MCG (2000 UT) TABS    Sig: Take 1 tablet (2,000 Units total) by mouth daily.    Dispense:  90 tablet    Refill:  1      Follow-up: Return in about 3 months (around 07/19/2021).  Scarlette Calico, MD

## 2021-04-18 NOTE — Patient Instructions (Signed)

## 2021-04-18 NOTE — Telephone Encounter (Signed)
Her future labs that need to be drawn from my standpoint are her estradiol and [place] once she has stopped her birth control pills for 2 weeks.   If this can be added under my name at her PCP lab location, I am ok with that plan.   Everything should result in Epic.

## 2021-04-19 ENCOUNTER — Other Ambulatory Visit (HOSPITAL_COMMUNITY): Payer: Self-pay

## 2021-04-19 DIAGNOSIS — E559 Vitamin D deficiency, unspecified: Secondary | ICD-10-CM | POA: Insufficient documentation

## 2021-04-19 LAB — URINALYSIS, ROUTINE W REFLEX MICROSCOPIC
Bilirubin Urine: NEGATIVE
Ketones, ur: NEGATIVE
Nitrite: NEGATIVE
Specific Gravity, Urine: <=1.005 — AB (ref 1.000–1.030)
Total Protein, Urine: NEGATIVE
Urine Glucose: NEGATIVE
Urobilinogen, UA: 0.2 (ref 0.0–1.0)
pH: 6 (ref 5.0–8.0)

## 2021-04-19 MED ORDER — CHOLECALCIFEROL 50 MCG (2000 UT) PO TABS
1.0000 | ORAL_TABLET | Freq: Every day | ORAL | 1 refills | Status: DC
  Filled 2021-04-19: qty 90, 90d supply, fill #0

## 2021-04-25 ENCOUNTER — Ambulatory Visit
Admission: RE | Admit: 2021-04-25 | Discharge: 2021-04-25 | Disposition: A | Discharge status: Home / Self Care | Payer: 59 | Source: Ambulatory Visit | Attending: Internal Medicine | Admitting: Internal Medicine

## 2021-04-25 ENCOUNTER — Other Ambulatory Visit: Payer: Self-pay | Admitting: Obstetrics and Gynecology

## 2021-04-25 DIAGNOSIS — Z1231 Encounter for screening mammogram for malignant neoplasm of breast: Secondary | ICD-10-CM

## 2021-05-01 ENCOUNTER — Other Ambulatory Visit: Payer: Self-pay | Admitting: Obstetrics and Gynecology

## 2021-05-01 DIAGNOSIS — R928 Other abnormal and inconclusive findings on diagnostic imaging of breast: Secondary | ICD-10-CM

## 2021-05-04 ENCOUNTER — Other Ambulatory Visit (HOSPITAL_COMMUNITY): Payer: Self-pay

## 2021-05-09 DIAGNOSIS — H524 Presbyopia: Secondary | ICD-10-CM | POA: Diagnosis not present

## 2021-05-09 DIAGNOSIS — H52223 Regular astigmatism, bilateral: Secondary | ICD-10-CM | POA: Diagnosis not present

## 2021-06-01 ENCOUNTER — Ambulatory Visit
Admission: RE | Admit: 2021-06-01 | Discharge: 2021-06-01 | Disposition: A | Discharge status: Home / Self Care | Payer: 59 | Source: Ambulatory Visit | Attending: Obstetrics and Gynecology | Admitting: Obstetrics and Gynecology

## 2021-06-01 ENCOUNTER — Ambulatory Visit: Admission: RE | Admit: 2021-06-01 | Payer: 59 | Source: Ambulatory Visit

## 2021-06-01 DIAGNOSIS — R928 Other abnormal and inconclusive findings on diagnostic imaging of breast: Secondary | ICD-10-CM | POA: Diagnosis not present

## 2021-06-07 ENCOUNTER — Other Ambulatory Visit (HOSPITAL_COMMUNITY): Payer: Self-pay

## 2021-06-11 ENCOUNTER — Ambulatory Visit: Payer: 59 | Admitting: Internal Medicine

## 2021-07-12 ENCOUNTER — Other Ambulatory Visit (HOSPITAL_COMMUNITY): Payer: Self-pay

## 2021-07-16 ENCOUNTER — Institutional Professional Consult (permissible substitution): Payer: 59 | Admitting: Neurology

## 2021-07-19 ENCOUNTER — Ambulatory Visit: Payer: 59 | Admitting: Internal Medicine

## 2021-08-09 ENCOUNTER — Other Ambulatory Visit (HOSPITAL_COMMUNITY): Payer: Self-pay

## 2021-08-10 ENCOUNTER — Other Ambulatory Visit (HOSPITAL_COMMUNITY): Payer: Self-pay

## 2021-08-20 ENCOUNTER — Other Ambulatory Visit (HOSPITAL_COMMUNITY): Payer: Self-pay

## 2021-08-20 MED ORDER — IMIPRAMINE HCL 25 MG PO TABS
ORAL_TABLET | ORAL | 0 refills | Status: DC
  Filled 2021-08-20: qty 90, 30d supply, fill #0

## 2021-09-06 ENCOUNTER — Other Ambulatory Visit (HOSPITAL_COMMUNITY): Payer: Self-pay

## 2021-09-06 DIAGNOSIS — G43019 Migraine without aura, intractable, without status migrainosus: Secondary | ICD-10-CM | POA: Diagnosis not present

## 2021-09-06 MED ORDER — RIZATRIPTAN BENZOATE 10 MG PO TABS
ORAL_TABLET | ORAL | 5 refills | Status: DC
  Filled 2021-09-06: qty 4, 30d supply, fill #0
  Filled 2022-02-10: qty 4, 30d supply, fill #1

## 2021-09-06 MED ORDER — IMIPRAMINE HCL 25 MG PO TABS
ORAL_TABLET | ORAL | 5 refills | Status: DC
  Filled 2021-09-06: qty 90, 30d supply, fill #0
  Filled 2021-10-24: qty 90, 30d supply, fill #1
  Filled 2021-12-04: qty 90, 30d supply, fill #2
  Filled 2021-12-30: qty 90, 30d supply, fill #3
  Filled 2022-02-10: qty 90, 30d supply, fill #4
  Filled 2022-04-21: qty 60, 20d supply, fill #5
  Filled 2022-04-22: qty 30, 10d supply, fill #5

## 2021-09-13 ENCOUNTER — Other Ambulatory Visit (HOSPITAL_COMMUNITY): Payer: Self-pay

## 2021-10-17 ENCOUNTER — Ambulatory Visit: Payer: 59 | Admitting: Radiology

## 2021-10-17 ENCOUNTER — Other Ambulatory Visit (HOSPITAL_COMMUNITY): Payer: Self-pay

## 2021-10-17 VITALS — BP 140/82 | Ht 60.0 in

## 2021-10-17 DIAGNOSIS — N951 Menopausal and female climacteric states: Secondary | ICD-10-CM

## 2021-10-17 MED ORDER — PROGESTERONE MICRONIZED 100 MG PO CAPS
100.0000 mg | ORAL_CAPSULE | Freq: Every day | ORAL | 1 refills | Status: DC
  Filled 2021-10-17: qty 90, 90d supply, fill #0
  Filled 2021-12-30: qty 90, 90d supply, fill #1

## 2021-10-17 NOTE — Progress Notes (Signed)
? ?  Gabrielle Cross 1965-07-21 [id number] ? ? ?History: Postmenopausal 56 y.o. presents for consult re: menopausal symptoms. Having hot flashes, night sweats, trouble sleeping, crying spells and moodiness. Has a sleep study scheduled 11/01/21. Symptoms have worsened since being off norethindrone (about 8 months ago). Does have a history of migraine with vertigo aura. She is followed by a neurologist. ? ? ?Gynecologic History ?Postmenopausal ?HRT use: never, used POP until 8 months ago, no menses since ? ?Obstetric History ?OB History  ?Gravida Para Term Preterm AB Living  ?'2 1   1 1 1  '$ ?SAB IAB Ectopic Multiple Live Births  ?    1   1  ?  ?# Outcome Date GA Lbr Len/2nd Weight Sex Delivery Anes PTL Lv  ?2 Ectopic           ?1 Preterm     F Vag-Spont  Y LIV  ? ? ? ?The following portions of the patient's history were reviewed and updated as appropriate: allergies, current medications, past family history, past medical history, past social history, past surgical history, and problem list. ? ?Review of Systems ?Pertinent items noted in HPI and remainder of comprehensive ROS otherwise negative.  ?Past medical history, past surgical history, family history and social history were all reviewed and documented in the EPIC chart. ? ?Exam: ? ?Vitals:  ? 10/17/21 1315  ?BP: 140/82  ?Height: 5' (1.524 m)  ? ?Body mass index is 34.57 kg/m?. ? ?General appearance:  Normal ? ?  ? ?Patient informed chaperone available to be present for breast and pelvic exam. Patient has requested no chaperone to be present. Patient has been advised what will be completed during breast and pelvic exam.  ? ?Assessment/Plan:   ?1. Menopausal and female climacteric states ?- progesterone (PROMETRIUM) 100 MG capsule; Take 1 capsule (100 mg total) by mouth daily.  Dispense: 90 capsule; Refill: 1 ?-magnesium glycinate '400mg'$  nightly ?- consider treatment with SSRI to manage hot flashes and emotional lability ?-weight loss ?-increase  hydration ? ? ? ?Rubbie Battiest B WHNP-BC, 2:07 PM 10/17/2021  ?

## 2021-10-25 ENCOUNTER — Other Ambulatory Visit (HOSPITAL_COMMUNITY): Payer: Self-pay

## 2021-11-01 ENCOUNTER — Encounter: Payer: Self-pay | Admitting: Neurology

## 2021-11-01 ENCOUNTER — Ambulatory Visit (INDEPENDENT_AMBULATORY_CARE_PROVIDER_SITE_OTHER): Payer: 59 | Admitting: Neurology

## 2021-11-01 VITALS — BP 151/84 | HR 75 | Ht 60.0 in | Wt 179.0 lb

## 2021-11-01 DIAGNOSIS — R519 Headache, unspecified: Secondary | ICD-10-CM

## 2021-11-01 DIAGNOSIS — E669 Obesity, unspecified: Secondary | ICD-10-CM | POA: Diagnosis not present

## 2021-11-01 DIAGNOSIS — G478 Other sleep disorders: Secondary | ICD-10-CM

## 2021-11-01 DIAGNOSIS — R0681 Apnea, not elsewhere classified: Secondary | ICD-10-CM | POA: Diagnosis not present

## 2021-11-01 DIAGNOSIS — R351 Nocturia: Secondary | ICD-10-CM | POA: Diagnosis not present

## 2021-11-01 DIAGNOSIS — D751 Secondary polycythemia: Secondary | ICD-10-CM | POA: Diagnosis not present

## 2021-11-01 DIAGNOSIS — R0683 Snoring: Secondary | ICD-10-CM | POA: Diagnosis not present

## 2021-11-01 NOTE — Patient Instructions (Signed)

## 2021-11-01 NOTE — Progress Notes (Signed)
Subjective:    Patient ID: Gabrielle Cross is a 56 y.o. female.  HPI    Star Age, MD, PhD [facility] [address], [address] [place], [place] [address]  Dear Dr. Ronnald Ramp,   I saw your patient, Gabrielle Cross, upon your kind request in the sleep clinic today for initial consultation of her sleep disorder, in particular, concern for underlying obstructive sleep apnea.  The patient is unaccompanied today.  As you know, Gabrielle Cross is a 56 year old right-handed woman with an underlying medical history of hypertension, erythrocytosis, migraine headaches (followed by Dr. Domingo Cocking), vitamin D deficiency, and obesity, who reports snoring and excessive daytime somnolence, as well as witnessed apneas per husband's report.  I reviewed your office note from 04/18/2021.  Her Epworth sleepiness score is 8 out of 24, fatigue severity score is 43 out of 63.  She has no family history of sleep apnea.  She has woken up occasionally with a headache especially if she has not slept well for a few days.  She does not wake up fully rested and does not sleep through the night very well.  She was seen by hematology for elevated red blood cell counts, elevated hemoglobin in the recent past.  She has nocturia about once or twice per average night.  She is married and lives with her husband, her daughter is in college at [employer].  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  She is a non-smoker and drinks alcohol occasionally, drinks caffeine in the form of coffee, 2 cups in the morning and 1 soda in the evening.  Bedtime is generally around 9:30 PM and rise time around 5 AM.  She has a TV in her bedroom but does not watch it at night.  They have 1 cat in the household.  She reports being a mouth breather.  She is trying to lose weight.  She is a light sleeper and restless at night.  She has had perimenopausal symptoms.  Her Past Medical History Is Significant For: Past  Medical History:  Diagnosis Date   Blood transfusion without reported diagnosis 2002   Elevated hemoglobin ([place])    Incompetent cervix    DELIVERED AT 31 WEEKS   Migraines    Ruptured ectopic pregnancy 2002   WITH BLOOD TRANSFUSION 2U'S PRBC'S    Her Past Surgical History Is Significant For: Past Surgical History:  Procedure Laterality Date   CERCLAGE PLACEMENT     RESECTOSCOPIC POLYPECTOMY  2001   SALPINGECTOMY     RIGHT, RUPTURED ECTOPIC   SKIN CANCER EXCISION  2020   tangential bx  02/22/2019    Her Family History Is Significant For: Family History  Problem Relation Age of Onset   Colon polyps Mother    Hyperlipidemia Father    Colon polyps Sister    Colon polyps Brother    Colon cancer Neg Hx    Esophageal cancer Neg Hx    Rectal cancer Neg Hx    Stomach cancer Neg Hx    Sleep apnea Neg Hx     Her Social History Is Significant For: Social History   Socioeconomic History   Marital status: Married    Spouse name: Not on file   Number of children: Not on file   Years of education: Not on file   Highest education level: Not on file  Occupational History   Not on file  Tobacco Use   Smoking status: Never  Smokeless tobacco: Never  Vaping Use   Vaping Use: Never used  Substance and Sexual Activity   Alcohol use: Yes    Alcohol/week: 6.0 standard drinks    Types: 6 Cans of beer per week    Comment: weekends - social    Drug use: No   Sexual activity: Yes    Partners: Male    Birth control/protection: Pill, OCP    Comment: intercoure age 56, less than 5 sexual partners, des neg  Other Topics Concern   Not on file  Social History Narrative   Not on file   Social Determinants of Health   Financial Resource Strain: Not on file  Food Insecurity: Not on file  Transportation Needs: Not on file  Physical Activity: Not on file  Stress: Not on file  Social Connections: Not on file    Her Allergies Are:  Allergies  Allergen Reactions   Shellfish  Allergy Diarrhea  :   Her Current Medications Are:  Outpatient Encounter Medications as of 11/01/2021  Medication Sig   Cholecalciferol 50 MCG (2000 UT) TABS Take 1 tablet (2,000 Units total) by mouth daily.   imipramine (TOFRANIL) 25 MG tablet Take 3 tablets by mouth daily   meloxicam (MOBIC) 15 MG tablet Take 1 tablet (15 mg total) by mouth daily as needed for pain.   progesterone (PROMETRIUM) 100 MG capsule Take 1 capsule (100 mg total) by mouth daily.   rizatriptan (MAXALT) 10 MG tablet Take 1 tablet by mouth once a day as needed for migraine; may repeat once after 2 hours   rizatriptan (MAXALT) 10 MG tablet TAKE 1 TABLET BY MOUTH AS NEEDED FOR MIGRAINES MAY REPEAT ONCE AFTER 2 HOURS   No facility-administered encounter medications on file as of 11/01/2021.  :   Review of Systems:  Out of a complete 14 point review of systems, all are reviewed and negative with the exception of these symptoms as listed below:   Review of Systems  Neurological:        Pt here for sleep consult   Pt states she snores ,fatigue, headaches. Pt denies Hypertension,sleep study, CPAP machine    ESS:8 FSS:43   Objective:  Neurological Exam  Physical Exam Physical Examination:   Vitals:   11/01/21 1540  BP: (!) 151/84  Pulse: 75    General Examination: The patient is a very pleasant 56 y.o. female in no acute distress. She appears well-developed and well-nourished and well groomed.   HEENT: Normocephalic, atraumatic, pupils are equal, round and reactive to light, extraocular tracking is good without limitation to gaze excursion or nystagmus noted. Hearing is grossly intact. Face is symmetric with normal facial animation. Speech is clear with no dysarthria noted. There is no hypophonia. There is no lip, neck/head, jaw or voice tremor. Neck is supple with full range of passive and active motion. There are no carotid bruits on auscultation. Oropharynx exam reveals: mild mouth dryness, good dental  hygiene and mild airway crowding, due to weak size of about 1-2+ on the right and 1+ on the left, Mallampati class IV.  Neck circumference of 17-1/4 inches, moderate overbite noted.  Tongue protrudes centrally.  Chest: Clear to auscultation without wheezing, rhonchi or crackles noted.  Heart: S1+S2+0, regular and normal with a slight systolic murmur noted, 1/6?.  Abdomen: Soft, non-tender and non-distended with normal bowel sounds appreciated on auscultation.  Extremities: There is no obvious edema.  Skin: Warm and dry without trophic changes noted.   Musculoskeletal: exam reveals  no obvious joint deformities.   Neurologically:  Mental status: The patient is awake, alert and oriented in all 4 spheres. Her immediate and remote memory, attention, language skills and fund of knowledge are appropriate. There is no evidence of aphasia, agnosia, apraxia or anomia. Speech is clear with normal prosody and enunciation. Thought process is linear. Mood is normal and affect is normal.  Cranial nerves II - XII are as described above under HEENT exam.  Motor exam: Normal bulk, strength and tone is noted. There is no obvious tremor.  Fine motor skills and coordination: grossly intact.  Cerebellar testing: No dysmetria or intention tremor. There is no truncal or gait ataxia.  Sensory exam: intact to light touch in the upper and lower extremities.  Gait, station and balance: She stands easily. No veering to one side is noted. No leaning to one side is noted. Posture is age-appropriate and stance is narrow based. Gait shows normal stride length and normal pace. No problems turning are noted.   Assessment and Plan:  In summary, Ethyl Vila is a very pleasant 56 y.o.-year old female with an underlying medical history of hypertension, erythrocytosis, migraine headaches (followed by Dr. Domingo Cocking), vitamin D deficiency, and obesity, whose history and physical exam are concerning for obstructive sleep apnea  (OSA). I had a long chat with the patient about my findings and the diagnosis of OSA, its prognosis and treatment options. We talked about medical treatments, surgical interventions and non-pharmacological approaches. I explained in particular the risks and ramifications of untreated moderate to severe OSA, especially with respect to developing cardiovascular disease down the Road, including congestive heart failure, difficult to treat hypertension, cardiac arrhythmias, or stroke. Even type 2 diabetes has, in part, been linked to untreated OSA. Symptoms of untreated OSA include daytime sleepiness, memory problems, mood irritability and mood disorder such as depression and anxiety, lack of energy, as well as recurrent headaches, especially morning headaches. We talked about trying to maintain a healthy lifestyle in general, as well as the importance of weight control. We also talked about the importance of good sleep hygiene. I recommended the following at this time: sleep study.  I outlined the differences between a laboratory attended sleep study versus home sleep testing.  She is encouraged to talk to you about a possible heart murmur, she has no symptoms from this and I am not fully sure if she actually just had a benign flow murmur. I explained the sleep test procedure to the patient and also outlined possible surgical and non-surgical treatment options of OSA, including the use of a custom-made dental device (which would require a referral to a specialist dentist or oral surgeon), upper airway surgical options, such as traditional UPPP or a novel less invasive surgical option in the form of Inspire hypoglossal nerve stimulation (which would involve a referral to an ENT surgeon). I also explained the CPAP treatment option to the patient, who indicated that she would be willing to try PAP therapy, if the need arises. I explained the importance of being compliant with PAP treatment, not only for insurance  purposes but primarily to improve Her symptoms, and for the patient's long term health benefit, including to reduce Her cardiovascular risks. I answered all her questions today and the patient was in agreement. I plan to see her back after the sleep study is completed and encouraged her to call with any interim questions, concerns, problems or updates.   Thank you very much for allowing me to participate in the  care of this nice patient. If I can be of any further assistance to you please do not hesitate to call me at [phone].  Sincerely,   Star Age, MD, PhD

## 2021-11-06 ENCOUNTER — Telehealth: Payer: Self-pay | Admitting: Neurology

## 2021-11-06 NOTE — Telephone Encounter (Signed)
left VM 11/06/21 [facility] no auth req spoke to AMber B ref # [id number]

## 2021-11-07 NOTE — Telephone Encounter (Signed)
HST- [facility] no auth req spoke to AMber B ref # [id number]- patient chose the HST. Patient returned the call she is scheduled to pick up HST for 11/20/21 at 8:30 AM.

## 2021-11-12 DIAGNOSIS — D225 Melanocytic nevi of trunk: Secondary | ICD-10-CM | POA: Diagnosis not present

## 2021-11-12 DIAGNOSIS — L858 Other specified epidermal thickening: Secondary | ICD-10-CM | POA: Diagnosis not present

## 2021-11-12 DIAGNOSIS — Z85828 Personal history of other malignant neoplasm of skin: Secondary | ICD-10-CM | POA: Diagnosis not present

## 2021-11-12 DIAGNOSIS — L821 Other seborrheic keratosis: Secondary | ICD-10-CM | POA: Diagnosis not present

## 2021-11-12 DIAGNOSIS — L578 Other skin changes due to chronic exposure to nonionizing radiation: Secondary | ICD-10-CM | POA: Diagnosis not present

## 2021-11-12 DIAGNOSIS — D2222 Melanocytic nevi of left ear and external auricular canal: Secondary | ICD-10-CM | POA: Diagnosis not present

## 2021-11-12 DIAGNOSIS — L814 Other melanin hyperpigmentation: Secondary | ICD-10-CM | POA: Diagnosis not present

## 2021-11-12 DIAGNOSIS — D2261 Melanocytic nevi of right upper limb, including shoulder: Secondary | ICD-10-CM | POA: Diagnosis not present

## 2021-11-20 ENCOUNTER — Ambulatory Visit: Payer: 59 | Admitting: Neurology

## 2021-11-20 DIAGNOSIS — G4733 Obstructive sleep apnea (adult) (pediatric): Secondary | ICD-10-CM

## 2021-11-20 DIAGNOSIS — R519 Headache, unspecified: Secondary | ICD-10-CM

## 2021-11-20 DIAGNOSIS — D751 Secondary polycythemia: Secondary | ICD-10-CM

## 2021-11-20 DIAGNOSIS — E66811 Obesity, class 1: Secondary | ICD-10-CM

## 2021-11-20 DIAGNOSIS — R0681 Apnea, not elsewhere classified: Secondary | ICD-10-CM

## 2021-11-20 DIAGNOSIS — R0683 Snoring: Secondary | ICD-10-CM

## 2021-11-20 DIAGNOSIS — R351 Nocturia: Secondary | ICD-10-CM

## 2021-11-20 DIAGNOSIS — E669 Obesity, unspecified: Secondary | ICD-10-CM

## 2021-11-20 DIAGNOSIS — G478 Other sleep disorders: Secondary | ICD-10-CM

## 2021-11-22 NOTE — Procedures (Signed)
   [facility]  HOME SLEEP TEST (Watch PAT) REPORT  STUDY DATE: 11/20/2021  DOB: [birth year]  MRN: [id number]  ORDERING CLINICIAN: Star Age, MD, PhD   REFERRING CLINICIAN: Janith Lima, MD   CLINICAL INFORMATION/HISTORY: 56 year old right-handed woman with an underlying medical history of hypertension, erythrocytosis, migraine headaches (followed by Dr. Domingo Cocking), vitamin D deficiency, and obesity, who reports snoring and excessive daytime somnolence, as well as witnessed apneas.   Epworth sleepiness score: 8/24.  BMI: 35.1 kg/m  FINDINGS:   Sleep Summary:   Total Recording Time (hours, min): 8 hours, 19 minutes  Total Sleep Time (hours, min):  7 hours, 21 minutes   Percent REM (%):    20.1%   Respiratory Indices:   Calculated pAHI (per hour):  9.9/hour         REM pAHI:    35.1/hour       NREM pAHI: 3.6/hour  Oxygen Saturation Statistics:    Oxygen Saturation (%) Mean: 94%   Minimum oxygen saturation (%):                 88%   O2 Saturation Range (%): 88-98%    O2 Saturation (minutes) <=88%: 0.1 min  Pulse Rate Statistics:   Pulse Mean (bpm):    79/min    Pulse Range (60-109/min)   IMPRESSION: OSA (obstructive sleep apnea), mild  RECOMMENDATION:  This home sleep test demonstrates overall mild obstructive sleep apnea with a total AHI of 9.9/hour and O2 nadir of 88%.  Intermittent mild to moderate snoring was detected.  Given the patient's medical history and sleep related complaints, treatment with positive airway pressure can be considered. This can be achieved in the form of autoPAP trial/titration at home. A full night CPAP titration study may help with proper treatment settings and mask fitting if needed. Alternative treatments include weight loss along with avoidance of the supine sleep position, or an oral appliance in appropriate candidates.   Please note that untreated obstructive sleep apnea may carry additional perioperative  morbidity. Patients with significant obstructive sleep apnea should receive perioperative PAP therapy and the surgeons and particularly the anesthesiologist should be informed of the diagnosis and the severity of the sleep disordered breathing. The patient should be cautioned not to drive, work at heights, or operate dangerous or heavy equipment when tired or sleepy. Review and reiteration of good sleep hygiene measures should be pursued with any patient. Other causes of the patient's symptoms, including circadian rhythm disturbances, an underlying mood disorder, medication effect and/or an underlying medical problem cannot be ruled out based on this test. Clinical correlation is recommended.   The patient and her referring provider will be notified of the test results. The patient will be seen in follow up in sleep clinic at [facility].  I certify that I have reviewed the raw data recording prior to the issuance of this report in accordance with the standards of the American Academy of Sleep Medicine (AASM).  INTERPRETING PHYSICIAN:   Star Age, MD, PhD  Board Certified in Neurology and Sleep Medicine  [facility] [address], [place], [place] [address] [phone]

## 2021-11-27 ENCOUNTER — Telehealth: Payer: Self-pay

## 2021-11-29 ENCOUNTER — Encounter: Payer: Self-pay | Admitting: *Deleted

## 2021-11-29 NOTE — Telephone Encounter (Signed)
I called pt. I advised pt that Dr. Rexene Alberts reviewed their sleep study results and found that pt had mild OSA. Dr. Rexene Alberts recommends that pt start autopap. I reviewed PAP compliance expectations with the pt. Pt is agreeable to starting an auto-PAP. I advised pt that an order will be sent to a DME, [facility], and they will call the pt within about one week after they file with the pt's insurance. [facility] will show the pt how to use the machine, fit for masks, and troubleshoot the auto-PAP if needed. A follow up appt was made for insurance purposes with Dr. Rexene Alberts on 01-14-2022 at 3:15pm. Pt verbalized understanding to arrive 15 minutes early and bring their auto-PAP. A letter with all of this information in it will be mailed to the pt as a reminder. I verified with the pt that the address we have on file is correct. Pt verbalized understanding of results. Pt had no questions at this time but was encouraged to call back if questions arise. I have sent the order to [employer] and have received confirmation that they have received the order.

## 2021-11-29 NOTE — Telephone Encounter (Signed)
-----   Message from Anda Latina, RN sent at 11/27/2021  8:02 AM EDT -----  ----- Message ----- From: Star Age, MD Sent: 11/22/2021   5:33 PM EDT To: Gna-Pod 4 Results  Patient referred by Dr. Ronnald Ramp, seen by me on 11/01/2021, HST on 11/20/2021.    Please call and notify the patient that the recent home sleep test showed obstructive sleep apnea. OSA is overall mild, but worth treating to see if she feels better after treatment. To that end I recommend treatment for this in the form of autoPAP, which means, that we don't have to bring her in for a sleep study with CPAP, but will let her try an autoPAP machine at home, through a DME company (of her choice, or as per insurance requirement). The DME representative will educate her on how to use the machine, how to put the mask on, etc. I have placed an order in the chart. Please send referral, talk to patient, send report to referring MD. We will need a FU in sleep clinic for 10 weeks post-PAP set up, please arrange that with me or one of our NPs. Thanks,   Star Age, MD, PhD [facility])

## 2021-11-29 NOTE — Telephone Encounter (Signed)
Error

## 2021-11-29 NOTE — Telephone Encounter (Signed)
Pt returned phone call would like a call back.

## 2021-12-05 ENCOUNTER — Other Ambulatory Visit (HOSPITAL_COMMUNITY): Payer: Self-pay

## 2021-12-05 NOTE — Telephone Encounter (Signed)
Denyse Amass, RN; Vanessa Ralphs got it      Previous Messages    ----- Message -----  From: Brandon Melnick, RN  Sent: 11/29/2021   2:31 PM EDT  To: Vanessa Ralphs; Marchelle Gearing   Good afternoon,   New order in Epic for pt. New user autopap   Sinclair Grooms  Female, 56 y.o., 12-24-1965  MRN:  [id number]  Phone:  [phone]   Thank you,   Lovey Newcomer RN

## 2021-12-31 ENCOUNTER — Other Ambulatory Visit (HOSPITAL_COMMUNITY): Payer: Self-pay

## 2022-01-14 ENCOUNTER — Ambulatory Visit: Payer: 59 | Admitting: Neurology

## 2022-01-18 ENCOUNTER — Other Ambulatory Visit: Payer: 59

## 2022-01-18 ENCOUNTER — Other Ambulatory Visit: Payer: Self-pay | Admitting: Radiology

## 2022-01-18 DIAGNOSIS — N926 Irregular menstruation, unspecified: Secondary | ICD-10-CM | POA: Diagnosis not present

## 2022-01-19 LAB — FOLLICLE STIMULATING HORMONE: FSH: 68.4 m[IU]/mL

## 2022-01-22 ENCOUNTER — Other Ambulatory Visit: Payer: Self-pay | Admitting: Radiology

## 2022-01-22 DIAGNOSIS — N950 Postmenopausal bleeding: Secondary | ICD-10-CM

## 2022-01-31 ENCOUNTER — Ambulatory Visit (INDEPENDENT_AMBULATORY_CARE_PROVIDER_SITE_OTHER): Payer: 59

## 2022-01-31 ENCOUNTER — Ambulatory Visit (INDEPENDENT_AMBULATORY_CARE_PROVIDER_SITE_OTHER): Payer: 59 | Admitting: Radiology

## 2022-01-31 DIAGNOSIS — N840 Polyp of corpus uteri: Secondary | ICD-10-CM | POA: Diagnosis not present

## 2022-01-31 DIAGNOSIS — N939 Abnormal uterine and vaginal bleeding, unspecified: Secondary | ICD-10-CM

## 2022-01-31 DIAGNOSIS — N950 Postmenopausal bleeding: Secondary | ICD-10-CM | POA: Diagnosis not present

## 2022-01-31 DIAGNOSIS — R9389 Abnormal findings on diagnostic imaging of other specified body structures: Secondary | ICD-10-CM | POA: Diagnosis not present

## 2022-01-31 NOTE — Progress Notes (Signed)
   Gabrielle Cross [birth year] [id number]   History: Postmenopausal 56 y.o. presents for ultrasound evaluation after experiencing PMB. Taking progesterone '100mg'$  nightly.    Gynecologic History Postmenopausal Last Pap: 2022. Results were: normal Last mammogram: 2022. Results were: normal   Obstetric History OB History  Gravida Para Term Preterm AB Living  '2 1   1 1 1  '$ SAB IAB Ectopic Multiple Live Births      1   1    # Outcome Date GA Lbr Len/2nd Weight Sex Delivery Anes PTL Lv  2 Ectopic           1 Preterm     F Vag-Spont  Y LIV     The following portions of the patient's history were reviewed and updated as appropriate: allergies, current medications, past family history, past medical history, past social history, past surgical history, and problem list.  Review of Systems Pertinent items noted in HPI and remainder of comprehensive ROS otherwise negative.  Past medical history, past surgical history, family history and social history were all reviewed and documented in the EPIC chart.  Exam:  There were no vitals filed for this visit. There is no height or weight on file to calculate BMI.  Narrative & Impression Indications: PMB   Anteverted slightly enlarged fibroid uterus 9.10cm x 5.57cm x 4.82cm Prominent avascular complex ?nabothian cyst seen in the posterior cervix measuring 2.7x 1.6x 2cm Multiple small intramural and subserosal fibroids noted   Thickened asymmetrical endometrium measuring approximately 10.41m Multiple areas with prominent feeder vessels seen within the cavity ?polyps 0.8x0.5cm, 0.5 x0.4cm and 0.7x0.4cm   Left ovary atrophic, WNL positive perfusion Right ovary not visualized   No adnexal masses or free fluid   Impression: fibroid uterus with endometrial thickening and endometrial polyps    Assessment/Plan:   1. Endometrial polyp 2. Abnormal uterine bleeding due to endometrial polyp 3. Thickened endometrium  - [place]; Future    Elma Limas B WHNP-BC, 3:29 PM 01/31/2022

## 2022-02-11 ENCOUNTER — Other Ambulatory Visit (HOSPITAL_COMMUNITY): Payer: Self-pay

## 2022-02-21 ENCOUNTER — Other Ambulatory Visit: Payer: 59

## 2022-02-21 ENCOUNTER — Other Ambulatory Visit: Payer: 59 | Admitting: Radiology

## 2022-03-08 ENCOUNTER — Other Ambulatory Visit: Payer: 59

## 2022-03-18 ENCOUNTER — Other Ambulatory Visit (HOSPITAL_COMMUNITY): Payer: Self-pay

## 2022-03-18 DIAGNOSIS — G43019 Migraine without aura, intractable, without status migrainosus: Secondary | ICD-10-CM | POA: Diagnosis not present

## 2022-03-18 MED ORDER — RIZATRIPTAN BENZOATE 10 MG PO TABS
10.0000 mg | ORAL_TABLET | ORAL | 5 refills | Status: DC | PRN
  Filled 2022-03-18: qty 4, 7d supply, fill #0

## 2022-03-18 MED ORDER — IMIPRAMINE HCL 25 MG PO TABS
75.0000 mg | ORAL_TABLET | Freq: Every day | ORAL | 5 refills | Status: DC
  Filled 2022-03-18: qty 90, 30d supply, fill #0
  Filled 2022-06-12: qty 90, 30d supply, fill #1
  Filled 2022-07-16: qty 90, 30d supply, fill #2
  Filled 2022-09-25: qty 90, 30d supply, fill #3
  Filled 2022-11-24: qty 90, 30d supply, fill #4

## 2022-03-19 ENCOUNTER — Other Ambulatory Visit (HOSPITAL_COMMUNITY): Payer: Self-pay

## 2022-03-19 ENCOUNTER — Other Ambulatory Visit: Payer: Self-pay | Admitting: Radiology

## 2022-03-19 DIAGNOSIS — N951 Menopausal and female climacteric states: Secondary | ICD-10-CM

## 2022-03-19 MED ORDER — PROGESTERONE MICRONIZED 100 MG PO CAPS
100.0000 mg | ORAL_CAPSULE | Freq: Every day | ORAL | 1 refills | Status: DC
  Filled 2022-03-19 – 2022-03-22 (×2): qty 90, 90d supply, fill #0
  Filled 2022-07-16: qty 90, 90d supply, fill #1

## 2022-03-19 NOTE — Telephone Encounter (Signed)
Last annual exam was 09/2020

## 2022-03-20 ENCOUNTER — Other Ambulatory Visit (HOSPITAL_COMMUNITY): Payer: Self-pay

## 2022-03-21 ENCOUNTER — Ambulatory Visit (INDEPENDENT_AMBULATORY_CARE_PROVIDER_SITE_OTHER): Payer: 59 | Admitting: Obstetrics & Gynecology

## 2022-03-21 ENCOUNTER — Ambulatory Visit (INDEPENDENT_AMBULATORY_CARE_PROVIDER_SITE_OTHER): Payer: 59

## 2022-03-21 ENCOUNTER — Encounter: Payer: Self-pay | Admitting: Obstetrics & Gynecology

## 2022-03-21 VITALS — BP 120/84 | HR 80

## 2022-03-21 DIAGNOSIS — R9389 Abnormal findings on diagnostic imaging of other specified body structures: Secondary | ICD-10-CM

## 2022-03-21 DIAGNOSIS — N939 Abnormal uterine and vaginal bleeding, unspecified: Secondary | ICD-10-CM | POA: Diagnosis not present

## 2022-03-21 DIAGNOSIS — N840 Polyp of corpus uteri: Secondary | ICD-10-CM

## 2022-03-21 DIAGNOSIS — N950 Postmenopausal bleeding: Secondary | ICD-10-CM | POA: Diagnosis not present

## 2022-03-21 NOTE — Progress Notes (Addendum)
Gabrielle Cross 09-Jun-1965 [id number]        56 y.o.  [id number]   RP: PMB/Perimenopausal bleeding/Endometrial Polyp for Sonohysto  HPI: PMB. Taking progesterone '100mg'$  nightly. Pelvic US 01/31/2022 showed a thickened asymmetrical endometrium measuring approximately 10.07 mm with multiple areas with prominent feeder vessels seen within the cavity. Probable polyps 0.8x0.5cm, 0.5 x0.4cm and 0.7x0.4cm.  Decision made to proceed with a Sonohysto.   OB History  Gravida Para Term Preterm AB Living  '2 1   1 1 1  '$ SAB IAB Ectopic Multiple Live Births      1   1    # Outcome Date GA Lbr Len/2nd Weight Sex Delivery Anes PTL Lv  2 Ectopic           1 Preterm     F Vag-Spont  Y LIV    Past medical history,surgical history, problem list, medications, allergies, family history and social history were all reviewed and documented in the EPIC chart.   Directed ROS with pertinent positives and negatives documented in the history of present illness/assessment and plan.  Exam:  Vitals:   03/21/22 1531  BP: 120/84  Pulse: 80  SpO2: 98%   General appearance:  Normal                                                                    Sono Infusion Hysterogram ( procedure note)   The initial transvaginal ultrasound demonstrated the following:  Comparison is made with previous scan January 31, 2022.  T/V images.  Anteverted, slightly enlarged uterus with multiple small fibroids to include intramural, subserosal, and submucosal.  The uterus is measured at 9.26 x 5.76 x 4.47 cm.  Thickened, irregular endometrium measured at 7.2 mm with feeder vessels identified to the area of thickening.  3D imaging suggest at least 1 intracavitary mass,  in addition to 2 small submucosal fibroids.  Both ovaries are identified with a single small follicle on the left ovary.  No adnexal mass.  No free fluid in the pelvis.   The speculum  was inserted and the cervix cleansed with Betadine solution after confirming that  patient has no allergies.A small sonohysterography catheterwas utilized.  Insertion was facilitated with ring forceps, using a spear-like motion the catheter was inserted to the fundus of the uterus. The speculum is then removed carefully to avoid dislodging the catheter. The catheter was flushed with sterile saline delete prior to insertion to rid it of small amounts of air.the sterile saline solution was infused into the uterine cavity as a vaginal ultrasound probe was then placed in the vagina for full visualization of the uterine cavity from a transvaginal approach. The following was noted:  Saline infusion reveals 2 echogenic intracavitary mass measured at 8 mm each.  The fibroids appear to be along the wall with no protrusion in the intra uterine cavity.  The catheter was then removed after retrieving some of the saline from the intrauterine cavity. An endometrial biopsy was not done. Patient tolerated procedure well.    Assessment/Plan:  55 y.o. [id number]   1. PMB (postmenopausal bleeding) PMB. Taking progesterone '100mg'$  nightly. Pelvic US 01/31/2022 showed a thickened asymmetrical endometrium measuring approximately 10.07 mm with multiple areas with prominent feeder vessels seen within the  cavity. Probable polyps 0.8x0.5cm, 0.5 x0.4cm and 0.7x0.4cm.  Decision made to proceed with a Sonohysto.  Sonohysto findings thoroughly reviewed with [name].  2 endometrial polyps 8 mm each.  The fibroids don't appear to have a submucosal component.  Decision to proceed with [facility] Myosure excisions of intrauterine lesions/Dilation and Curettage.  Preop preparation with IV Antibiotics/PAS devices, surgery and risks, especially the risk of uterine perforation, and postop expectations reviewed and discussed.  Will schedule surgery for 05/17/22 at Mina's preference.    2. Thickened endometrium As above.  3. Endometrial polyp 2 intrauterine lesions 8 mm each c/w endometrial polyps.  Other orders - MAGNESIUM  PO; Take by mouth.                         Patient was counseled as to the risk of surgery to include the following:  1. Infection (prohylactic antibiotics will be administered)  2. DVT/Pulmonary Embolism (prophylactic pneumo compression stockings will be used)  3.Trauma to internal organs requiring additional surgical procedure to repair any injury to internal organs requiring perhaps additional hospitalization days.  4.Hemmorhage requiring transfusion and blood products which carry risks such as anaphylactic reaction, hepatitis and AIDS  Patient had received literature information on the procedure scheduled and all her questions were answered and fully accepts all risk.  Princess Bruins MD, 4:44 PM 03/21/2022

## 2022-03-22 ENCOUNTER — Telehealth: Payer: Self-pay | Admitting: *Deleted

## 2022-03-22 ENCOUNTER — Other Ambulatory Visit (HOSPITAL_COMMUNITY): Payer: Self-pay

## 2022-03-22 NOTE — Telephone Encounter (Signed)
Left message to call Robbert Langlinais, RN at [facility], [phone].  

## 2022-03-22 NOTE — Telephone Encounter (Signed)
Spoke with patient. Reviewed surgery dates. Patient request to proceed with surgery on 04/19/22.  Advised patient I will forward to business office for return call. I will return call once surgery date and time confirmed. Patient verbalizes understanding and is agreeable.   Surgery request sent.

## 2022-03-22 NOTE — Telephone Encounter (Signed)
-----   Message from Princess Bruins, MD sent at 03/21/2022  4:45 PM EDT ----- Regarding: Schedule Surgery Surgery: CPT 83291 - Hysteroscopy/D&C/Myosure  Diagnosis: N95.0 Postmenopausal Bleeding, N84.0 Endometrial Polyps  Location: [place]  Status: Outpatient  Time: 30 Minutes  Assistant: N/A  Urgency: At Patient's Convenience:  05/17/22  Pre-Op Appointment: Completed  Post-Op Appointment(s): 2 Weeks  Time Out Of Work: Day Of Surgery ONLY

## 2022-03-26 NOTE — Telephone Encounter (Signed)
Spoke with patient. Surgery date request confirmed.  Patient notified of benefits per Ernesta Amble.  Advised surgery is scheduled for 04/19/22, [facility] at 0830.  Surgery instruction sheet and hospital brochure reviewed, printed copy offered to patient, patient declined.   Patient verbalizes understanding and is agreeable.  Routing to provider. Encounter closed.

## 2022-04-15 ENCOUNTER — Encounter (HOSPITAL_BASED_OUTPATIENT_CLINIC_OR_DEPARTMENT_OTHER): Payer: Self-pay | Admitting: Obstetrics & Gynecology

## 2022-04-15 NOTE — Progress Notes (Signed)
Spoke w/ via phone for pre-op interview--- patient Lab needs dos---- CBC per MD              Lab results------ COVID test -----patient states asymptomatic no test needed Arrive at ------- 0630 on 04/19/22 NPO after MN NO Solid Food.  Clear liquids from MN until---0530 on 04/19/22 Med rec completed Medications to take morning of surgery ----- maxalt prn Diabetic medication ----- n/a Patient instructed no nail polish to be worn day of surgery Patient instructed to bring photo id and insurance card day of surgery Patient aware to have Driver (ride ) / caregiver    for 24 hours after surgery- Daniyah Fohl (daughter) [phone]  Patient Special Instructions -----none     Pre-Op special Istructions ----- none Patient verbalized understanding of instructions that were given at this phone interview. Patient denies shortness of breath, chest pain, fever, cough at this phone interview.  Lyndel Pleasure, RN

## 2022-04-19 ENCOUNTER — Other Ambulatory Visit: Payer: Self-pay

## 2022-04-19 ENCOUNTER — Encounter (HOSPITAL_BASED_OUTPATIENT_CLINIC_OR_DEPARTMENT_OTHER): Payer: Self-pay | Admitting: Obstetrics & Gynecology

## 2022-04-19 ENCOUNTER — Ambulatory Visit (HOSPITAL_BASED_OUTPATIENT_CLINIC_OR_DEPARTMENT_OTHER)
Admission: RE | Admit: 2022-04-19 | Discharge: 2022-04-19 | Disposition: A | Discharge status: Home / Self Care | Payer: 59 | Attending: Obstetrics & Gynecology | Admitting: Obstetrics & Gynecology

## 2022-04-19 ENCOUNTER — Encounter (HOSPITAL_BASED_OUTPATIENT_CLINIC_OR_DEPARTMENT_OTHER)
Admission: RE | Disposition: A | Discharge status: Home / Self Care | Payer: Self-pay | Source: Home / Self Care | Attending: Obstetrics & Gynecology

## 2022-04-19 ENCOUNTER — Ambulatory Visit (HOSPITAL_BASED_OUTPATIENT_CLINIC_OR_DEPARTMENT_OTHER): Payer: 59 | Admitting: Anesthesiology

## 2022-04-19 DIAGNOSIS — I10 Essential (primary) hypertension: Secondary | ICD-10-CM | POA: Diagnosis not present

## 2022-04-19 DIAGNOSIS — N840 Polyp of corpus uteri: Secondary | ICD-10-CM | POA: Insufficient documentation

## 2022-04-19 DIAGNOSIS — D250 Submucous leiomyoma of uterus: Secondary | ICD-10-CM | POA: Insufficient documentation

## 2022-04-19 DIAGNOSIS — G4733 Obstructive sleep apnea (adult) (pediatric): Secondary | ICD-10-CM | POA: Diagnosis not present

## 2022-04-19 DIAGNOSIS — M1990 Unspecified osteoarthritis, unspecified site: Secondary | ICD-10-CM | POA: Insufficient documentation

## 2022-04-19 DIAGNOSIS — Z79890 Hormone replacement therapy (postmenopausal): Secondary | ICD-10-CM | POA: Diagnosis not present

## 2022-04-19 DIAGNOSIS — N950 Postmenopausal bleeding: Secondary | ICD-10-CM | POA: Diagnosis not present

## 2022-04-19 DIAGNOSIS — Z85828 Personal history of other malignant neoplasm of skin: Secondary | ICD-10-CM | POA: Insufficient documentation

## 2022-04-19 DIAGNOSIS — Z01818 Encounter for other preprocedural examination: Secondary | ICD-10-CM

## 2022-04-19 HISTORY — DX: Obstructive sleep apnea (adult) (pediatric): G47.33

## 2022-04-19 HISTORY — PX: DILATATION & CURETTAGE/HYSTEROSCOPY WITH MYOSURE: SHX6511

## 2022-04-19 HISTORY — DX: Unspecified osteoarthritis, unspecified site: M19.90

## 2022-04-19 HISTORY — DX: Presence of spectacles and contact lenses: Z97.3

## 2022-04-19 HISTORY — DX: Carpal tunnel syndrome, bilateral upper limbs: G56.03

## 2022-04-19 LAB — CBC
HCT: 49 % — ABNORMAL HIGH (ref 36.0–46.0)
Hemoglobin: 15.9 g/dL — ABNORMAL HIGH (ref 12.0–15.0)
MCH: 27.2 pg (ref 26.0–34.0)
MCHC: 32.4 g/dL (ref 30.0–36.0)
MCV: 83.9 fL (ref 80.0–100.0)
Platelets: 406 10*3/uL — ABNORMAL HIGH (ref 150–400)
RBC: 5.84 MIL/uL — ABNORMAL HIGH (ref 3.87–5.11)
RDW: 15.1 % (ref 11.5–15.5)
WBC: 5.4 10*3/uL (ref 4.0–10.5)
nRBC: 0 % (ref 0.0–0.2)

## 2022-04-19 LAB — POCT PREGNANCY, URINE: Preg Test, Ur: NEGATIVE

## 2022-04-19 SURGERY — DILATATION & CURETTAGE/HYSTEROSCOPY WITH MYOSURE
Anesthesia: General | Site: Vagina

## 2022-04-19 MED ORDER — LIDOCAINE HCL 1 % IJ SOLN
INTRAMUSCULAR | Status: DC | PRN
  Administered 2022-04-19: 10 mL

## 2022-04-19 MED ORDER — LACTATED RINGERS IV SOLN: INTRAVENOUS | Status: DC

## 2022-04-19 MED ORDER — FENTANYL CITRATE (PF) 250 MCG/5ML IJ SOLN
INTRAMUSCULAR | Status: DC | PRN
  Administered 2022-04-19: 25 ug via INTRAVENOUS
  Administered 2022-04-19: 50 ug via INTRAVENOUS
  Administered 2022-04-19: 25 ug via INTRAVENOUS

## 2022-04-19 MED ORDER — FENTANYL CITRATE (PF) 100 MCG/2ML IJ SOLN
INTRAMUSCULAR | Status: AC
  Filled 2022-04-19: qty 2

## 2022-04-19 MED ORDER — CEFAZOLIN SODIUM-DEXTROSE 2-4 GM/100ML-% IV SOLN
INTRAVENOUS | Status: AC
  Filled 2022-04-19: qty 100

## 2022-04-19 MED ORDER — KETOROLAC TROMETHAMINE 30 MG/ML IJ SOLN
INTRAMUSCULAR | Status: DC | PRN
  Administered 2022-04-19: 30 mg via INTRAVENOUS

## 2022-04-19 MED ORDER — SCOPOLAMINE 1 MG/3DAYS TD PT72
1.0000 | MEDICATED_PATCH | TRANSDERMAL | Status: DC
  Administered 2022-04-19: 1.5 mg via TRANSDERMAL

## 2022-04-19 MED ORDER — POVIDONE-IODINE 10 % EX SWAB: 2.0000 | Freq: Once | CUTANEOUS | Status: DC

## 2022-04-19 MED ORDER — ONDANSETRON HCL 4 MG/2ML IJ SOLN
INTRAMUSCULAR | Status: DC | PRN
  Administered 2022-04-19: 4 mg via INTRAVENOUS

## 2022-04-19 MED ORDER — MIDAZOLAM HCL 2 MG/2ML IJ SOLN
INTRAMUSCULAR | Status: AC
  Filled 2022-04-19: qty 2

## 2022-04-19 MED ORDER — MIDAZOLAM HCL 2 MG/2ML IJ SOLN
INTRAMUSCULAR | Status: DC | PRN
  Administered 2022-04-19: 2 mg via INTRAVENOUS

## 2022-04-19 MED ORDER — CEFAZOLIN SODIUM-DEXTROSE 2-4 GM/100ML-% IV SOLN
2.0000 g | INTRAVENOUS | Status: AC
  Administered 2022-04-19: 2 g via INTRAVENOUS

## 2022-04-19 MED ORDER — KETOROLAC TROMETHAMINE 30 MG/ML IJ SOLN
INTRAMUSCULAR | Status: AC
  Filled 2022-04-19: qty 2

## 2022-04-19 MED ORDER — SCOPOLAMINE 1 MG/3DAYS TD PT72
MEDICATED_PATCH | TRANSDERMAL | Status: AC
  Filled 2022-04-19: qty 1

## 2022-04-19 MED ORDER — DEXAMETHASONE SODIUM PHOSPHATE 10 MG/ML IJ SOLN
INTRAMUSCULAR | Status: DC | PRN
  Administered 2022-04-19: 5 mg via INTRAVENOUS

## 2022-04-19 MED ORDER — LIDOCAINE 2% (20 MG/ML) 5 ML SYRINGE
INTRAMUSCULAR | Status: DC | PRN
  Administered 2022-04-19: 50 mg via INTRAVENOUS

## 2022-04-19 MED ORDER — PROPOFOL 10 MG/ML IV BOLUS
INTRAVENOUS | Status: DC | PRN
  Administered 2022-04-19: 150 mg via INTRAVENOUS

## 2022-04-19 MED ORDER — SODIUM CHLORIDE 0.9 % IR SOLN
Status: DC | PRN
  Administered 2022-04-19: 3000 mL

## 2022-04-19 MED ORDER — PROPOFOL 10 MG/ML IV BOLUS
INTRAVENOUS | Status: AC
  Filled 2022-04-19: qty 20

## 2022-04-19 SURGICAL SUPPLY — 22 items
CATH ROBINSON RED A/P 16FR (CATHETERS) ×1 IMPLANT
DEVICE MYOSURE LITE (MISCELLANEOUS) IMPLANT
DEVICE MYOSURE REACH (MISCELLANEOUS) IMPLANT
DILATOR CANAL MILEX (MISCELLANEOUS) IMPLANT
DRSG TELFA 3X8 NADH STRL (GAUZE/BANDAGES/DRESSINGS) ×1 IMPLANT
ELECT REM PT RETURN 9FT ADLT (ELECTROSURGICAL)
ELECTRODE REM PT RTRN 9FT ADLT (ELECTROSURGICAL) IMPLANT
GAUZE 4X4 16PLY ~~LOC~~+RFID DBL (SPONGE) ×2 IMPLANT
GLOVE BIO SURGEON STRL SZ 6.5 (GLOVE) ×1 IMPLANT
GLOVE BIOGEL PI IND STRL 7.0 (GLOVE) ×2 IMPLANT
GOWN STRL REUS W/TWL LRG LVL3 (GOWN DISPOSABLE) ×2 IMPLANT
IV NS IRRIG 3000ML ARTHROMATIC (IV SOLUTION) ×1 IMPLANT
KIT PROCEDURE FLUENT (KITS) ×1 IMPLANT
KIT TURNOVER CYSTO (KITS) ×1 IMPLANT
MYOSURE XL FIBROID (MISCELLANEOUS)
PACK VAGINAL MINOR WOMEN LF (CUSTOM PROCEDURE TRAY) ×1 IMPLANT
PAD OB MATERNITY 4.3X12.25 (PERSONAL CARE ITEMS) ×1 IMPLANT
PAD PREP 24X48 CUFFED NSTRL (MISCELLANEOUS) ×1 IMPLANT
SEAL CERVICAL OMNI LOK (ABLATOR) IMPLANT
SEAL ROD LENS SCOPE MYOSURE (ABLATOR) ×1 IMPLANT
SYSTEM TISS REMOVAL MYOSURE XL (MISCELLANEOUS) IMPLANT
TOWEL OR 17X26 10 PK STRL BLUE (TOWEL DISPOSABLE) ×1 IMPLANT

## 2022-04-19 NOTE — Discharge Instructions (Signed)
DISCHARGE INSTRUCTIONS: HYSTEROSCOPY  The following instructions have been prepared to help you care for yourself upon your return home.   Personal hygiene:  Use sanitary pads for vaginal drainage, not tampons.  Shower the day after your procedure.  NO tub baths, pools or Jacuzzis for 2-3 weeks.  Wipe front to back after using the bathroom.  Activity and limitations:  Do NOT drive or operate any equipment for 24 hours. The effects of anesthesia are still present and drowsiness may result.  Do NOT rest in bed all day.  Walking is encouraged.  Walk up and down stairs slowly.  You may resume your normal activity in one to two days or as indicated by your physician. Sexual activity: NO intercourse for at least 2 weeks after the procedure, or as indicated by your Doctor.  Diet: Eat a light meal as desired this evening. You may resume your usual diet tomorrow.  Return to Work: You may resume your work activities in one to two days or as indicated by Marine scientist.  What to expect after your surgery: Expect to have vaginal bleeding/discharge for 2-3 days and spotting for up to 10 days. It is not unusual to have soreness for up to 1-2 weeks. You may have a slight burning sensation when you urinate for the first day. Mild cramps may continue for a couple of days. You may have a regular period in 2-6 weeks.  Call your doctor for any of the following:  Excessive vaginal bleeding or clotting, saturating and changing one pad every hour.  Inability to urinate 6 hours after discharge from hospital.  Pain not relieved by pain medication.  Fever of 100.4 F or greater.  Unusual vaginal discharge or odor.     No ibuprofen, Advil, Aleve, Motrin, ketorolac, meloxicam, naproxen, or other NSAIDS until after 3:00pm today if needed for pain.     Post Anesthesia Home Care Instructions  Activity: Get plenty of rest for the remainder of the day. A responsible individual must stay with you for 24  hours following the procedure.  For the next 24 hours, DO NOT: -Drive a car -Paediatric nurse -Drink alcoholic beverages -Take any medication unless instructed by your physician -Make any legal decisions or sign important papers.  Meals: Start with liquid foods such as gelatin or soup. Progress to regular foods as tolerated. Avoid greasy, spicy, heavy foods. If nausea and/or vomiting occur, drink only clear liquids until the nausea and/or vomiting subsides. Call your physician if vomiting continues.  Special Instructions/Symptoms: Your throat may feel dry or sore from the anesthesia or the breathing tube placed in your throat during surgery. If this causes discomfort, gargle with warm salt water. The discomfort should disappear within 24 hours.  If you had a scopolamine patch placed behind your ear for the management of post- operative nausea and/or vomiting:  1. The medication in the patch is effective for 72 hours, after which it should be removed.  Wrap patch in a tissue and discard in the trash. Wash hands thoroughly with soap and water. 2. You may remove the patch earlier than 72 hours if you experience unpleasant side effects which may include dry mouth, dizziness or visual disturbances. 3. Avoid touching the patch. Wash your hands with soap and water after contact with the patch.

## 2022-04-19 NOTE — Anesthesia Preprocedure Evaluation (Addendum)
Anesthesia Evaluation  Patient identified by MRN, date of birth, ID band Patient awake    Reviewed: Allergy & Precautions, NPO status , Patient's Chart, lab work & pertinent test results  Airway Mallampati: III  TM Distance: >3 FB Neck ROM: Full    Dental  (+) Teeth Intact, Dental Advisory Given   Pulmonary sleep apnea    breath sounds clear to auscultation       Cardiovascular hypertension,  Rhythm:Regular Rate:Normal     Neuro/Psych  Headaches  negative psych ROS   GI/Hepatic negative GI ROS, Neg liver ROS,,,  Endo/Other  negative endocrine ROS    Renal/GU negative Renal ROS     Musculoskeletal  (+) Arthritis ,    Abdominal   Peds  Hematology negative hematology ROS (+)   Anesthesia Other Findings   Reproductive/Obstetrics                             Anesthesia Physical Anesthesia Plan  ASA: 2  Anesthesia Plan: General   Post-op Pain Management:    Induction: Intravenous  PONV Risk Score and Plan: 4 or greater and Ondansetron, Dexamethasone, Midazolam and Scopolamine patch - Pre-op  Airway Management Planned: LMA  Additional Equipment: None  Intra-op Plan:   Post-operative Plan: Extubation in OR  Informed Consent: I have reviewed the patients History and Physical, chart, labs and discussed the procedure including the risks, benefits and alternatives for the proposed anesthesia with the patient or authorized representative who has indicated his/her understanding and acceptance.     Dental advisory given  Plan Discussed with: CRNA  Anesthesia Plan Comments:        Anesthesia Quick Evaluation

## 2022-04-19 NOTE — Transfer of Care (Addendum)
Immediate Anesthesia Transfer of Care Note  Patient: Gabrielle Cross  Procedure(s) Performed: Procedure(s) (LRB): DILATATION & CURETTAGE/HYSTEROSCOPY WITH MYOSURE (N/A)  Patient Location: PACU  Anesthesia Type: General  Level of Consciousness: awake, alert  and oriented  Airway & Oxygen Therapy: Patient Spontanous Breathing and Patient connected to nasal cannula oxygen,oral airway out  Post-op Assessment: Report given to PACU RN and Post -op Vital signs reviewed and stable  Post vital signs: Reviewed and stable  Complications: No apparent anesthesia complications  Last Vitals:  Vitals Value Taken Time  BP 153/98 04/19/22 0922  Temp 36.5 C 04/19/22 0922  Pulse 94 04/19/22 0924  Resp 17 04/19/22 0924  SpO2 99 % 04/19/22 0924  Vitals shown include unvalidated device data.  Last Pain:  Vitals:   04/19/22 0650  TempSrc: Oral  PainSc: 0-No pain      Patients Stated Pain Goal: 2 (81/15/72 6203)  Complications: No notable events documented.

## 2022-04-19 NOTE — Op Note (Signed)
Operative Note  04/19/2022  9:31 AM  PATIENT:  Gabrielle Cross  56 y.o. female  PRE-OPERATIVE DIAGNOSIS:  Postmenopausal Bleeding, Endometrial Polyp  POST-OPERATIVE DIAGNOSIS:  Postmenopausal Bleeding, Endometrial Polyp, Submucosal Myomas  PROCEDURE:  Procedure(s): DILATATION & CURETTAGE/HYSTEROSCOPY WITH MYOSURE EXCISIONS  SURGEON:  Surgeon(s): Princess Bruins, MD  ANESTHESIA:   general with laryngeal mask  FINDINGS: Tubular endometrial polyp, 2 small Submucosal Myomas.  Bilateral Ostia well visualized.  DESCRIPTION OF OPERATION: Under general anesthesia with laryngeal mask, the patient is in lithotomy position.  She is prepped with Betadine on the suprapubic, vulvar and vaginal areas.  The bladder is catheterized.  She is draped as usual.  Timeout is done.  The vaginal exam reveals an anteverted uterus, about 9 cm in diameter, mobile.  No adnexal mass.  The speculum is inserted in the vagina and the anterior lip of the cervix is grasped with a tenaculum.  A paracervical block is done with lidocaine 1% a total of 10 cc at 4 and 8:00.  Dilation of the cervix with an os finder and then with Kennon Rounds dilators up to #19 without difficulty.  The hysteroscope is inserted in the intra uterine cavity.  Inspection reveals a tubular endometrial polyp originating from the right posterior mid endometrial cavity, and 2 very small submucosal fibroids 1 at the right fundus and the other 1 at the left posterior wall.  Pictures were taken.  We used the reach MyoSure to excise those 3 lesions.  Pictures were taken after excisions.  Hemostasis was adequate.  No other lesions were visible in both ostia were well visualized.  The hysteroscope with MyoSure were removed from the intrauterine cavity.  We used a small sharp curette to curettage the endometrial cavity systematically on all surfaces.  The curette was removed.  Both specimens were sent to pathology together.  The tenaculum was removed from the cervix.   Hemostasis was adequate at that level.  The speculum was removed.  The patient was brought to recovery room in good and stable status.  ESTIMATED BLOOD LOSS: 5 mL FLUID DEFICIT: 135 mL  Intake/Output Summary (Last 24 hours) at 04/19/2022 0931 Last data filed at 04/19/2022 8921 Gross per 24 hour  Intake 500 ml  Output 5 ml  Net 495 ml     BLOOD ADMINISTERED:none   LOCAL MEDICATIONS USED:  LIDOCAINE 1% 10 cc Paracervical Block  SPECIMEN:  Source of Specimen:  Excision material of endometrial polyp and submucosal myomas, endometrial curettings  DISPOSITION OF SPECIMEN:  PATHOLOGY  COUNTS:  YES  PLAN OF CARE: Transfer to PACU  Marie-Lyne LavoieMD9:31 AM

## 2022-04-19 NOTE — Anesthesia Procedure Notes (Signed)
Procedure Name: LMA Insertion Date/Time: 04/19/2022 8:44 AM  Performed by: Clearnce Sorrel, CRNAPre-anesthesia Checklist: Patient identified, Emergency Drugs available, Suction available and Patient being monitored Patient Re-evaluated:Patient Re-evaluated prior to induction Oxygen Delivery Method: Circle System Utilized Preoxygenation: Pre-oxygenation with 100% oxygen Induction Type: IV induction Ventilation: Mask ventilation without difficulty LMA: LMA inserted LMA Size: 4.0 Number of attempts: 1 Airway Equipment and Method: Bite block Placement Confirmation: positive ETCO2 Tube secured with: Tape Dental Injury: Teeth and Oropharynx as per pre-operative assessment

## 2022-04-19 NOTE — Anesthesia Postprocedure Evaluation (Signed)
Anesthesia Post Note  Patient: Gabrielle Cross  Procedure(s) Performed: DILATATION & CURETTAGE/HYSTEROSCOPY WITH MYOSURE (Vagina )     Patient location during evaluation: PACU Anesthesia Type: General Level of consciousness: awake and alert Pain management: pain level controlled Vital Signs Assessment: post-procedure vital signs reviewed and stable Respiratory status: spontaneous breathing, nonlabored ventilation, respiratory function stable and patient connected to nasal cannula oxygen Cardiovascular status: blood pressure returned to baseline and stable Postop Assessment: no apparent nausea or vomiting Anesthetic complications: no   No notable events documented.  Last Vitals:  Vitals:   04/19/22 1000 04/19/22 1015  BP: 131/82 131/88  Pulse: 74 78  Resp: 17 12  Temp:    SpO2: 95% 97%    Last Pain:  Vitals:   04/19/22 1000  TempSrc:   PainSc: 0-No pain                 Effie Berkshire

## 2022-04-19 NOTE — H&P (Signed)
Gabrielle Cross is an 56 y.o. female.[id number]   RP: HSC/Myosure Excision/D+C for PMB/Perimenopausal bleeding/Endometrial Polyp    HPI: PMB. Taking progesterone '100mg'$  nightly. Pelvic US 01/31/2022 showed a thickened asymmetrical endometrium measuring approximately 10.07 mm with multiple areas with prominent feeder vessels seen within the cavity. Probable polyps 0.8x0.5cm, 0.5 x0.4cm and 0.7x0.4cm.  Decision made to proceed with a Sonohysto.   Pertinent Gynecological History: Menses:  Perimenopausal/PMB Contraception: condoms Blood transfusions: none Sexually transmitted diseases: no past history Previous GYN Procedures: Rt Salpingectomy for Rt ruptured ectopic Last mammogram: Rt screening mammo Neg, Lt Dx mammo/US Neg 05/2021 Last pap: normal 09/2020    Menstrual History:  Patient's last menstrual period was 02/01/2022.    Past Medical History:  Diagnosis Date   Arthritis    Blood transfusion without reported diagnosis 2002   Carpal tunnel syndrome, bilateral    Elevated hemoglobin (HCC)    Incompetent cervix    DELIVERED AT 31 WEEKS   Migraines    OSA (obstructive sleep apnea)    Rx for autopap, does not use   Ruptured ectopic pregnancy 2002   WITH BLOOD TRANSFUSION 2U'S PRBC'S   Wears glasses     Past Surgical History:  Procedure Laterality Date   CERCLAGE PLACEMENT     RESECTOSCOPIC POLYPECTOMY  2001   SALPINGECTOMY Right    RIGHT, RUPTURED ECTOPIC   SKIN CANCER EXCISION  2020   tangential bx  02/22/2019    Family History  Problem Relation Age of Onset   Colon polyps Mother    Hyperlipidemia Father    Colon polyps Sister    Colon polyps Brother    Colon cancer Neg Hx    Esophageal cancer Neg Hx    Rectal cancer Neg Hx    Stomach cancer Neg Hx    Sleep apnea Neg Hx     Social History:  reports that she has never smoked. She has never used smokeless tobacco. She reports current alcohol use. She reports that she does not use drugs.  Allergies:  Allergies   Allergen Reactions   Shellfish Allergy Diarrhea    Medications Prior to Admission  Medication Sig Dispense Refill Last Dose   Cholecalciferol 50 MCG (2000 UT) TABS Take 1 tablet (2,000 Units total) by mouth daily. 90 tablet 1 Past Month   imipramine (TOFRANIL) 25 MG tablet Take 3 tablets by mouth daily 90 tablet 5 04/16/2022   imipramine (TOFRANIL) 25 MG tablet Take 3 tablets (75 mg total) by mouth daily. 90 tablet 5    MAGNESIUM PO Take by mouth.   04/17/2022   progesterone (PROMETRIUM) 100 MG capsule Take 1 capsule (100 mg total) by mouth daily. 90 capsule 1 04/18/2022   meloxicam (MOBIC) 15 MG tablet Take 1 tablet (15 mg total) by mouth daily as needed for pain. 30 tablet 0 More than a month   rizatriptan (MAXALT) 10 MG tablet Take 1 tablet by mouth once a day as needed for migraine; may repeat once after 2 hours 4 tablet 5 More than a month   rizatriptan (MAXALT) 10 MG tablet Take 1 tablet (10 mg total) by mouth as needed for migraine. May repeat once after 2 hours. 4 tablet 5 More than a month    REVIEW OF SYSTEMS: A ROS was performed and pertinent positives and negatives are included in the history.  GENERAL: No fevers or chills. HEENT: No change in vision, no earache, sore throat or sinus congestion. NECK: No pain or stiffness. CARDIOVASCULAR: No chest pain or  pressure. No palpitations. PULMONARY: No shortness of breath, cough or wheeze. GASTROINTESTINAL: No abdominal pain, nausea, vomiting or diarrhea, melena or bright red blood per rectum. GENITOURINARY: No urinary frequency, urgency, hesitancy or dysuria. MUSCULOSKELETAL: No joint or muscle pain, no back pain, no recent trauma. DERMATOLOGIC: No rash, no itching, no lesions. ENDOCRINE: No polyuria, polydipsia, no heat or cold intolerance. No recent change in weight. HEMATOLOGICAL: No anemia or easy bruising or bleeding. NEUROLOGIC: No headache, seizures, numbness, tingling or weakness. PSYCHIATRIC: No depression, no loss of interest in  normal activity or change in sleep pattern.     Blood pressure (!) 154/90, pulse 92, temperature 97.8 F (36.6 C), temperature source Oral, resp. rate 17, height 5' (1.524 m), weight 82.8 kg, last menstrual period 02/01/2022, SpO2 94 %.  Physical Exam:  Results for orders placed or performed during the hospital encounter of 04/19/22 (from the past 24 hour(s))  Pregnancy, urine POC     Status: None   Collection Time: 04/19/22  6:59 AM  Result Value Ref Range   Preg Test, Ur NEGATIVE NEGATIVE  CBC     Status: Abnormal   Collection Time: 04/19/22  7:00 AM  Result Value Ref Range   WBC 5.4 4.0 - 10.5 K/uL   RBC 5.84 (H) 3.87 - 5.11 MIL/uL   Hemoglobin 15.9 (H) 12.0 - 15.0 g/dL   HCT 49.0 (H) 36.0 - 46.0 %   MCV 83.9 80.0 - 100.0 fL   MCH 27.2 26.0 - 34.0 pg   MCHC 32.4 30.0 - 36.0 g/dL   RDW 15.1 11.5 - 15.5 %   Platelets 406 (H) 150 - 400 K/uL   nRBC 0.0 0.0 - 0.2 %    Sono Infusion Hysterogram ( procedure note)     The initial transvaginal ultrasound demonstrated the following:   Comparison is made with previous scan January 31, 2022.  T/V images.  Anteverted, slightly enlarged uterus with multiple small fibroids to include intramural, subserosal, and submucosal.  The uterus is measured at 9.26 x 5.76 x 4.47 cm.  Thickened, irregular endometrium measured at 7.2 mm with feeder vessels identified to the area of thickening.  3D imaging suggest at least 1 intracavitary mass,  in addition to 2 small submucosal fibroids.  Both ovaries are identified with a single small follicle on the left ovary.  No adnexal mass.  No free fluid in the pelvis.     The speculum  was inserted and the cervix cleansed with Betadine solution after confirming that patient has no allergies.A small sonohysterography catheterwas utilized.  Insertion was facilitated with ring forceps, using a spear-like motion the catheter was inserted to the fundus of the uterus. The speculum is then removed carefully to avoid  dislodging the catheter. The catheter was flushed with sterile saline delete prior to insertion to rid it of small amounts of air.the sterile saline solution was infused into the uterine cavity as a vaginal ultrasound probe was then placed in the vagina for full visualization of the uterine cavity from a transvaginal approach. The following was noted:   Saline infusion reveals 2 echogenic intracavitary mass measured at 8 mm each.  The fibroids appear to be along the wall with no protrusion in the intra uterine cavity.   The catheter was then removed after retrieving some of the saline from the intrauterine cavity. An endometrial biopsy was not done. Patient tolerated procedure well.      Assessment/Plan:  56 y.o. [id number]    1. PMB (postmenopausal bleeding) PMB.  Taking progesterone '100mg'$  nightly. Pelvic US 01/31/2022 showed a thickened asymmetrical endometrium measuring approximately 10.07 mm with multiple areas with prominent feeder vessels seen within the cavity. Probable polyps 0.8x0.5cm, 0.5 x0.4cm and 0.7x0.4cm.  Decision made to proceed with a Sonohysto.  Sonohysto findings thoroughly reviewed with Gabrielle Cross.  2 endometrial polyps 8 mm each.  The fibroids don't appear to have a submucosal component.  Decision to proceed with [facility] Myosure excisions of intrauterine lesions/Dilation and Curettage.  Preop preparation with IV Antibiotics/PAS devices, surgery and risks, especially the risk of uterine perforation, and postop expectations reviewed and discussed.    2. Thickened endometrium As above.   3. Endometrial polyp 2 intrauterine lesions 8 mm each c/w endometrial polyps.   Other orders - MAGNESIUM PO; Take by mouth.                          Patient was counseled as to the risk of surgery to include the following:   1. Infection (prohylactic antibiotics will be administered)   2. DVT/Pulmonary Embolism (prophylactic pneumo compression stockings will be used)   3.Trauma to internal organs  requiring additional surgical procedure to repair any injury to internal organs requiring perhaps additional hospitalization days.   4.Hemmorhage requiring transfusion and blood products which carry risks such as anaphylactic reaction, hepatitis and AIDS   Patient had received literature information on the procedure scheduled and all her questions were answered and fully accepts all risk.    Gabrielle Cross 04/19/2022, 8:20 AM

## 2022-04-22 ENCOUNTER — Other Ambulatory Visit (HOSPITAL_COMMUNITY): Payer: Self-pay

## 2022-04-22 ENCOUNTER — Encounter (HOSPITAL_BASED_OUTPATIENT_CLINIC_OR_DEPARTMENT_OTHER): Payer: Self-pay | Admitting: Obstetrics & Gynecology

## 2022-04-22 LAB — SURGICAL PATHOLOGY

## 2022-05-01 ENCOUNTER — Ambulatory Visit (INDEPENDENT_AMBULATORY_CARE_PROVIDER_SITE_OTHER): Payer: 59 | Admitting: Obstetrics & Gynecology

## 2022-05-01 ENCOUNTER — Encounter: Payer: Self-pay | Admitting: Obstetrics & Gynecology

## 2022-05-01 VITALS — BP 118/80 | HR 95

## 2022-05-01 DIAGNOSIS — Z09 Encounter for follow-up examination after completed treatment for conditions other than malignant neoplasm: Secondary | ICD-10-CM | POA: Diagnosis not present

## 2022-05-05 ENCOUNTER — Encounter: Payer: Self-pay | Admitting: Obstetrics & Gynecology

## 2022-05-05 NOTE — Progress Notes (Signed)
    Gabrielle Cross 1966-03-18 [id number]        56 y.o.  [id number]   RP: Postop HSC/Myosure/D+C on 04/19/22  HPI: Doing very well x surgery.  No vaginal bleeding, no discharge, no pelvic pain, no fever.  Urine/BMs normal.   OB History  Gravida Para Term Preterm AB Living  '2 1   1 1 1  '$ SAB IAB Ectopic Multiple Live Births      1   1    # Outcome Date GA Lbr Len/2nd Weight Sex Delivery Anes PTL Lv  2 Ectopic           1 Preterm     F Vag-Spont  Y LIV    Past medical history,surgical history, problem list, medications, allergies, family history and social history were all reviewed and documented in the EPIC chart.   Directed ROS with pertinent positives and negatives documented in the history of present illness/assessment and plan.  Exam:  Vitals:   05/01/22 0811  BP: 118/80  Pulse: 95  SpO2: 98%   General appearance:  Normal   Gynecologic exam: Deferred  Patho 04/19/22: FINAL MICROSCOPIC DIAGNOSIS:   A. ENDOMETRIUM, POLYP, SUBMUCOSAL FIBROIDS, CURETTAGE:  Inactive endometrium.  Benign endometrial polyp.  No atypia or malignancy.    Assessment/Plan:  56 y.o. [id number]   1. Status post gynecological surgery, follow-up exam  Doing very well x surgery.  No vaginal bleeding, no discharge, no pelvic pain, no fever.  Urine/BMs normal. Patho Benign.  Patient reassured.     Princess Bruins MD, 11:56 AM 05/05/2022

## 2022-05-20 DIAGNOSIS — H52223 Regular astigmatism, bilateral: Secondary | ICD-10-CM | POA: Diagnosis not present

## 2022-05-20 DIAGNOSIS — H524 Presbyopia: Secondary | ICD-10-CM | POA: Diagnosis not present

## 2022-07-08 ENCOUNTER — Encounter: Payer: Self-pay | Admitting: Internal Medicine

## 2022-07-08 ENCOUNTER — Telehealth: Payer: Self-pay

## 2022-07-08 NOTE — Telephone Encounter (Signed)
Patient questions will she schedule screening mammo this year or diagnostic mammo since last year she was called back for diagnostic screening.  She states she is not having any breast problems.  06/01/21 RECOMMENDATION: Screening mammogram in one year.(Code:SM-B-01Y)  Patient was advised screening mammo recommended this yaer.

## 2022-07-10 ENCOUNTER — Other Ambulatory Visit: Payer: Self-pay | Admitting: Obstetrics & Gynecology

## 2022-07-10 DIAGNOSIS — Z1231 Encounter for screening mammogram for malignant neoplasm of breast: Secondary | ICD-10-CM

## 2022-08-23 ENCOUNTER — Ambulatory Visit
Admission: RE | Admit: 2022-08-23 | Discharge: 2022-08-23 | Disposition: A | Discharge status: Home / Self Care | Payer: 59 | Source: Ambulatory Visit

## 2022-08-23 ENCOUNTER — Other Ambulatory Visit (HOSPITAL_COMMUNITY): Payer: Self-pay

## 2022-08-23 ENCOUNTER — Ambulatory Visit (AMBULATORY_SURGERY_CENTER): Payer: 59

## 2022-08-23 VITALS — Ht 60.0 in | Wt 189.0 lb

## 2022-08-23 DIAGNOSIS — L719 Rosacea, unspecified: Secondary | ICD-10-CM | POA: Insufficient documentation

## 2022-08-23 DIAGNOSIS — Z85828 Personal history of other malignant neoplasm of skin: Secondary | ICD-10-CM | POA: Insufficient documentation

## 2022-08-23 DIAGNOSIS — L814 Other melanin hyperpigmentation: Secondary | ICD-10-CM | POA: Insufficient documentation

## 2022-08-23 DIAGNOSIS — D225 Melanocytic nevi of trunk: Secondary | ICD-10-CM | POA: Insufficient documentation

## 2022-08-23 DIAGNOSIS — L821 Other seborrheic keratosis: Secondary | ICD-10-CM | POA: Insufficient documentation

## 2022-08-23 DIAGNOSIS — D2261 Melanocytic nevi of right upper limb, including shoulder: Secondary | ICD-10-CM | POA: Insufficient documentation

## 2022-08-23 DIAGNOSIS — Z1231 Encounter for screening mammogram for malignant neoplasm of breast: Secondary | ICD-10-CM | POA: Diagnosis not present

## 2022-08-23 DIAGNOSIS — Z86010 Personal history of colonic polyps: Secondary | ICD-10-CM

## 2022-08-23 DIAGNOSIS — L820 Inflamed seborrheic keratosis: Secondary | ICD-10-CM | POA: Insufficient documentation

## 2022-08-23 DIAGNOSIS — L578 Other skin changes due to chronic exposure to nonionizing radiation: Secondary | ICD-10-CM | POA: Insufficient documentation

## 2022-08-23 DIAGNOSIS — D2222 Melanocytic nevi of left ear and external auricular canal: Secondary | ICD-10-CM | POA: Insufficient documentation

## 2022-08-23 DIAGNOSIS — L858 Other specified epidermal thickening: Secondary | ICD-10-CM | POA: Insufficient documentation

## 2022-08-23 MED ORDER — NA SULFATE-K SULFATE-MG SULF 17.5-3.13-1.6 GM/177ML PO SOLN
1.0000 | Freq: Once | ORAL | 0 refills | Status: AC
  Filled 2022-08-23: qty 354, 1d supply, fill #0

## 2022-08-23 NOTE — Progress Notes (Signed)

## 2022-08-24 ENCOUNTER — Other Ambulatory Visit (HOSPITAL_COMMUNITY): Payer: Self-pay

## 2022-08-26 ENCOUNTER — Encounter: Payer: Self-pay | Admitting: Internal Medicine

## 2022-09-13 ENCOUNTER — Other Ambulatory Visit (HOSPITAL_COMMUNITY): Payer: Self-pay

## 2022-09-13 ENCOUNTER — Ambulatory Visit (INDEPENDENT_AMBULATORY_CARE_PROVIDER_SITE_OTHER): Payer: 59 | Admitting: Radiology

## 2022-09-13 VITALS — BP 136/88

## 2022-09-13 DIAGNOSIS — N951 Menopausal and female climacteric states: Secondary | ICD-10-CM

## 2022-09-13 MED ORDER — FLUOXETINE HCL 10 MG PO CAPS
10.0000 mg | ORAL_CAPSULE | Freq: Every day | ORAL | 0 refills | Status: DC
  Filled 2022-09-13: qty 90, 90d supply, fill #0

## 2022-09-13 NOTE — Progress Notes (Signed)
   Gabrielle Cross [birth year] [id number]   History: Postmenopausal 57 y.o. presents with c/o worsening mood changes with menopause. Increased anxiety, anger, lack of interest and fatigue. Low libido. Hx of migraine with aura. Currently on progesterone to help with sleep. S/p D&C for PMB; benign endometrial polyp 11/23.   Gynecologic History Postmenopausal Last Pap: 09/05/20. Results were: normal Last mammogram: 08/2022. Results were: normal   Obstetric History OB History  Gravida Para Term Preterm AB Living  2 1   1 1 1   SAB IAB Ectopic Multiple Live Births      1   1    # Outcome Date GA Lbr Len/2nd Weight Sex Delivery Anes PTL Lv  2 Ectopic           1 Preterm     F Vag-Spont  Y LIV     The following portions of the patient's history were reviewed and updated as appropriate: allergies, current medications, past family history, past medical history, past social history, past surgical history, and problem list.  Review of Systems Pertinent items noted in HPI and remainder of comprehensive ROS otherwise negative.  Past medical history, past surgical history, family history and social history were all reviewed and documented in the EPIC chart.  Exam:  Vitals:   09/13/22 0904  BP: 136/88   There is no height or weight on file to calculate BMI.  Physical Exam Vitals and nursing note reviewed.  Constitutional:      Appearance: Normal appearance. She is normal weight.  Pulmonary:     Effort: Pulmonary effort is normal.  Neurological:     Mental Status: She is alert and oriented to person, place, and time. Mental status is at baseline.  Psychiatric:        Mood and Affect: Mood normal.        Thought Content: Thought content normal.        Judgment: Judgment normal.      Assessment/Plan:   1. Menopausal syndrome Will try SSRI low dose as she weans from tricyclic she was using for migraines Neuro-Mag daily (OTC) to help with brain fog - FLUoxetine (PROZAC) 10 MG capsule;  Take 1 capsule (10 mg total) by mouth daily.  Dispense: 90 capsule; Refill: 0    Avraham Benish B WHNP-BC, 10:44 AM 09/13/2022

## 2022-09-17 ENCOUNTER — Other Ambulatory Visit (HOSPITAL_COMMUNITY): Payer: Self-pay

## 2022-09-17 ENCOUNTER — Encounter: Payer: Self-pay | Admitting: Internal Medicine

## 2022-09-17 ENCOUNTER — Ambulatory Visit (INDEPENDENT_AMBULATORY_CARE_PROVIDER_SITE_OTHER): Payer: 59 | Admitting: Internal Medicine

## 2022-09-17 VITALS — BP 124/82 | HR 84 | Temp 98.3°F | Ht 60.0 in | Wt 182.0 lb

## 2022-09-17 DIAGNOSIS — Z23 Encounter for immunization: Secondary | ICD-10-CM

## 2022-09-17 DIAGNOSIS — Z0000 Encounter for general adult medical examination without abnormal findings: Secondary | ICD-10-CM

## 2022-09-17 DIAGNOSIS — Z1159 Encounter for screening for other viral diseases: Secondary | ICD-10-CM | POA: Insufficient documentation

## 2022-09-17 DIAGNOSIS — D751 Secondary polycythemia: Secondary | ICD-10-CM

## 2022-09-17 DIAGNOSIS — E785 Hyperlipidemia, unspecified: Secondary | ICD-10-CM

## 2022-09-17 DIAGNOSIS — E559 Vitamin D deficiency, unspecified: Secondary | ICD-10-CM | POA: Diagnosis not present

## 2022-09-17 DIAGNOSIS — Z0001 Encounter for general adult medical examination with abnormal findings: Secondary | ICD-10-CM

## 2022-09-17 LAB — CBC WITH DIFFERENTIAL/PLATELET
Basophils Absolute: 0 10*3/uL (ref 0.0–0.1)
Basophils Relative: 0.5 % (ref 0.0–3.0)
Eosinophils Absolute: 0 10*3/uL (ref 0.0–0.7)
Eosinophils Relative: 0.2 % (ref 0.0–5.0)
HCT: 46 % (ref 36.0–46.0)
Hemoglobin: 15.1 g/dL — ABNORMAL HIGH (ref 12.0–15.0)
Lymphocytes Relative: 35.1 % (ref 12.0–46.0)
Lymphs Abs: 2 10*3/uL (ref 0.7–4.0)
MCHC: 32.8 g/dL (ref 30.0–36.0)
MCV: 83 fl (ref 78.0–100.0)
Monocytes Absolute: 0.4 10*3/uL (ref 0.1–1.0)
Monocytes Relative: 6.6 % (ref 3.0–12.0)
Neutro Abs: 3.2 10*3/uL (ref 1.4–7.7)
Neutrophils Relative %: 57.6 % (ref 43.0–77.0)
Platelets: 311 10*3/uL (ref 150.0–400.0)
RBC: 5.54 Mil/uL — ABNORMAL HIGH (ref 3.87–5.11)
RDW: 15 % (ref 11.5–15.5)
WBC: 5.6 10*3/uL (ref 4.0–10.5)

## 2022-09-17 LAB — LIPID PANEL
Cholesterol: 218 mg/dL — ABNORMAL HIGH (ref 0–200)
HDL: 49.1 mg/dL (ref >39.00)
LDL Cholesterol: 136 mg/dL — ABNORMAL HIGH (ref 0–99)
NonHDL: 168.48
Total CHOL/HDL Ratio: 4
Triglycerides: 160 mg/dL — ABNORMAL HIGH (ref 0.0–149.0)
VLDL: 32 mg/dL (ref 0.0–40.0)

## 2022-09-17 LAB — BASIC METABOLIC PANEL
BUN: 14 mg/dL (ref 6–23)
CO2: 28 mEq/L (ref 19–32)
Calcium: 9.4 mg/dL (ref 8.4–10.5)
Chloride: 103 mEq/L (ref 96–112)
Creatinine, Ser: 0.78 mg/dL (ref 0.40–1.20)
GFR: 85.89 mL/min (ref >60.00)
Glucose, Bld: 103 mg/dL — ABNORMAL HIGH (ref 70–99)
Potassium: 4 mEq/L (ref 3.5–5.1)
Sodium: 139 mEq/L (ref 135–145)

## 2022-09-17 LAB — HEPATIC FUNCTION PANEL
ALT: 20 U/L (ref 0–35)
AST: 14 U/L (ref 0–37)
Albumin: 4.4 g/dL (ref 3.5–5.2)
Alkaline Phosphatase: 94 U/L (ref 39–117)
Bilirubin, Direct: 0.1 mg/dL (ref 0.0–0.3)
Total Bilirubin: 0.4 mg/dL (ref 0.2–1.2)
Total Protein: 7.4 g/dL (ref 6.0–8.3)

## 2022-09-17 LAB — VITAMIN D 25 HYDROXY (VIT D DEFICIENCY, FRACTURES): VITD: 28.67 ng/mL — ABNORMAL LOW (ref 30.00–100.00)

## 2022-09-17 LAB — TSH: TSH: 0.49 u[IU]/mL (ref 0.35–5.50)

## 2022-09-17 MED ORDER — CHOLECALCIFEROL 50 MCG (2000 UT) PO TABS
1.0000 | ORAL_TABLET | Freq: Every day | ORAL | 3 refills | Status: DC
Start: 2022-09-17 — End: 2023-10-22
  Filled 2022-09-17: qty 90, 90d supply, fill #0

## 2022-09-17 NOTE — Progress Notes (Signed)
Subjective:  Patient ID: Gabrielle Cross, female    DOB: 18-Sep-1965  Age: 57 y.o. MRN: [id number]  CC: Annual Exam and Hyperlipidemia   HPI Gabrielle Cross presents for a CPX and f/up ----  She exercises several times throughout the week on a spin bike and a treadmill.  She can do at least 30 minutes with good exertion.  She denies chest pain, shortness of breath, diaphoresis, or edema.  Outpatient Medications Prior to Visit  Medication Sig Dispense Refill   FLUoxetine (PROZAC) 10 MG capsule Take 1 capsule (10 mg total) by mouth daily. 90 capsule 0   imipramine (TOFRANIL) 25 MG tablet Take 3 tablets (75 mg total) by mouth daily. 90 tablet 5   MAGNESIUM PO Take by mouth.     meloxicam (MOBIC) 15 MG tablet Take 1 tablet (15 mg total) by mouth daily as needed for pain. 30 tablet 0   progesterone (PROMETRIUM) 100 MG capsule Take 1 capsule (100 mg total) by mouth daily. 90 capsule 1   rizatriptan (MAXALT) 10 MG tablet Take 1 tablet (10 mg total) by mouth as needed for migraine. May repeat once after 2 hours. 4 tablet 5   imipramine (TOFRANIL) 25 MG tablet Take 3 tablets by mouth daily 90 tablet 5   rizatriptan (MAXALT) 10 MG tablet Take 1 tablet by mouth once a day as needed for migraine; may repeat once after 2 hours 4 tablet 5   No facility-administered medications prior to visit.    ROS Review of Systems  Constitutional: Negative.  Negative for diaphoresis and fatigue.  HENT: Negative.    Eyes: Negative.   Respiratory:  Negative for cough, chest tightness, shortness of breath and wheezing.   Cardiovascular:  Negative for chest pain, palpitations and leg swelling.  Gastrointestinal:  Negative for abdominal pain, constipation, diarrhea, nausea and vomiting.  Endocrine: Negative.   Genitourinary: Negative.  Negative for difficulty urinating.  Musculoskeletal: Negative.   Skin: Negative.   Neurological:  Negative for dizziness, weakness and headaches.  Hematological:  Negative for  adenopathy. Does not bruise/bleed easily.  Psychiatric/Behavioral: Negative.      Objective:  BP 124/82 (BP Location: Left Arm, Patient Position: Sitting, Cuff Size: Large)   Pulse 84   Temp 98.3 F (36.8 C) (Oral)   Ht 5' (1.524 m)   Wt 182 lb (82.6 kg)   SpO2 97%   BMI 35.54 kg/m   BP Readings from Last 3 Encounters:  09/20/22 116/81  09/17/22 124/82  09/13/22 136/88    Wt Readings from Last 3 Encounters:  09/17/22 182 lb (82.6 kg)  08/23/22 189 lb (85.7 kg)  04/19/22 182 lb 8 oz (82.8 kg)    Physical Exam Vitals reviewed.  Constitutional:      Appearance: Normal appearance.  HENT:     Nose: Nose normal.     Mouth/Throat:     Mouth: Mucous membranes are moist.  Eyes:     General: No scleral icterus.    Conjunctiva/sclera: Conjunctivae normal.  Cardiovascular:     Rate and Rhythm: Normal rate and regular rhythm.     Heart sounds: No murmur heard. Pulmonary:     Effort: Pulmonary effort is normal.     Breath sounds: No stridor. No wheezing, rhonchi or rales.  Abdominal:     General: Abdomen is flat.     Palpations: There is no mass.     Tenderness: There is no abdominal tenderness. There is no guarding.     Hernia: No hernia  is present.  Musculoskeletal:        General: Normal range of motion.     Cervical back: Neck supple.     Right lower leg: No edema.     Left lower leg: No edema.  Lymphadenopathy:     Cervical: No cervical adenopathy.  Skin:    General: Skin is warm and dry.  Neurological:     General: No focal deficit present.     Mental Status: She is alert. Mental status is at baseline.  Psychiatric:        Mood and Affect: Mood normal.        Behavior: Behavior normal.        Thought Content: Thought content normal.     Lab Results  Component Value Date   WBC 5.6 09/17/2022   HGB 15.1 (H) 09/17/2022   HCT 46.0 09/17/2022   PLT 311.0 09/17/2022   GLUCOSE 103 (H) 09/17/2022   CHOL 218 (H) 09/17/2022   TRIG 160.0 (H) 09/17/2022   HDL  49.10 09/17/2022   LDLCALC 136 (H) 09/17/2022   ALT 20 09/17/2022   AST 14 09/17/2022   NA 139 09/17/2022   K 4.0 09/17/2022   CL 103 09/17/2022   CREATININE 0.78 09/17/2022   BUN 14 09/17/2022   CO2 28 09/17/2022   TSH 0.49 09/17/2022    MM 3D SCREEN BREAST BILATERAL  Result Date: 08/26/2022 CLINICAL DATA:  Screening. EXAM: DIGITAL SCREENING BILATERAL MAMMOGRAM WITH TOMOSYNTHESIS AND CAD TECHNIQUE: Bilateral screening digital craniocaudal and mediolateral oblique mammograms were obtained. Bilateral screening digital breast tomosynthesis was performed. The images were evaluated with computer-aided detection. COMPARISON:  Previous exam(s). ACR Breast Density Category b: There are scattered areas of fibroglandular density. FINDINGS: There are no findings suspicious for malignancy. IMPRESSION: No mammographic evidence of malignancy. A result letter of this screening mammogram will be mailed directly to the patient. RECOMMENDATION: Screening mammogram in one year. (Code:SM-B-01Y) BI-RADS CATEGORY  1: Negative. Electronically Signed   By: Sherian Rein M.D.   On: 08/26/2022 15:25    Assessment & Plan:   Encounter for general adult medical examination with abnormal findings- Exam completed, labs reviewed, vaccines reviewed and updated, cancer screenings are up-to-date, patient education was given. -     Hepatitis C antibody; Future -     HIV Antibody (routine testing w rflx); Future  Need for hepatitis C screening test -     Hepatitis C antibody; Future  Dyslipidemia, goal LDL below 130- Statin is not indicated. -     Lipid panel; Future -     Basic metabolic panel; Future -     TSH; Future -     Hepatic function panel; Future  Vitamin D deficiency -     Basic metabolic panel; Future -     VITAMIN D 25 Hydroxy (Vit-D Deficiency, Fractures); Future -     Cholecalciferol; Take 1 tablet (2,000 Units total) by mouth daily.  Dispense: 90 tablet; Refill: 3  Erythrocytosis- H/H are lower. -      Basic metabolic panel; Future -     CBC with Differential/Platelet; Future -     Hepatic function panel; Future  Other orders -     Varicella-zoster vaccine IM     Follow-up: Return in about 6 months (around 03/19/2023).  Sanda Linger, MD

## 2022-09-17 NOTE — Patient Instructions (Addendum)
Please come back in 2 - 6 months for the second shingles vaccine   Health Maintenance, Female Adopting a healthy lifestyle and getting preventive care are important in promoting health and wellness. Ask your health care provider about: The right schedule for you to have regular tests and exams. Things you can do on your own to prevent diseases and keep yourself healthy. What should I know about diet, weight, and exercise? Eat a healthy diet  Eat a diet that includes plenty of vegetables, fruits, low-fat dairy products, and lean protein. Do not eat a lot of foods that are high in solid fats, added sugars, or sodium. Maintain a healthy weight Body mass index (BMI) is used to identify weight problems. It estimates body fat based on height and weight. Your health care provider can help determine your BMI and help you achieve or maintain a healthy weight. Get regular exercise Get regular exercise. This is one of the most important things you can do for your health. Most adults should: Exercise for at least 150 minutes each week. The exercise should increase your heart rate and make you sweat (moderate-intensity exercise). Do strengthening exercises at least twice a week. This is in addition to the moderate-intensity exercise. Spend less time sitting. Even light physical activity can be beneficial. Watch cholesterol and blood lipids Have your blood tested for lipids and cholesterol at 55 years of age, then have this test every 5 years. Have your cholesterol levels checked more often if: Your lipid or cholesterol levels are high. You are older than 55 years of age. You are at high risk for heart disease. What should I know about cancer screening? Depending on your health history and family history, you may need to have cancer screening at various ages. This may include screening for: Breast cancer. Cervical cancer. Colorectal cancer. Skin cancer. Lung cancer. What should I know about heart  disease, diabetes, and high blood pressure? Blood pressure and heart disease High blood pressure causes heart disease and increases the risk of stroke. This is more likely to develop in people who have high blood pressure readings or are overweight. Have your blood pressure checked: Every 3-5 years if you are 31-21 years of age. Every year if you are 58 years old or older. Diabetes Have regular diabetes screenings. This checks your fasting blood sugar level. Have the screening done: Once every three years after age 70 if you are at a normal weight and have a low risk for diabetes. More often and at a younger age if you are overweight or have a high risk for diabetes. What should I know about preventing infection? Hepatitis B If you have a higher risk for hepatitis B, you should be screened for this virus. Talk with your health care provider to find out if you are at risk for hepatitis B infection. Hepatitis C Testing is recommended for: Everyone born from [birth year] through 1963. Anyone with known risk factors for hepatitis C. Sexually transmitted infections (STIs) Get screened for STIs, including gonorrhea and chlamydia, if: You are sexually active and are younger than 55 years of age. You are older than 55 years of age and your health care provider tells you that you are at risk for this type of infection. Your sexual activity has changed since you were last screened, and you are at increased risk for chlamydia or gonorrhea. Ask your health care provider if you are at risk. Ask your health care provider about whether you are at high risk  for HIV. Your health care provider may recommend a prescription medicine to help prevent HIV infection. If you choose to take medicine to prevent HIV, you should first get tested for HIV. You should then be tested every 3 months for as long as you are taking the medicine. Pregnancy If you are about to stop having your period (premenopausal) and you may become  pregnant, seek counseling before you get pregnant. Take 400 to 800 micrograms (mcg) of folic acid every day if you become pregnant. Ask for birth control (contraception) if you want to prevent pregnancy. Osteoporosis and menopause Osteoporosis is a disease in which the bones lose minerals and strength with aging. This can result in bone fractures. If you are 14 years old or older, or if you are at risk for osteoporosis and fractures, ask your health care provider if you should: Be screened for bone loss. Take a calcium or vitamin D supplement to lower your risk of fractures. Be given hormone replacement therapy (HRT) to treat symptoms of menopause. Follow these instructions at home: Alcohol use Do not drink alcohol if: Your health care provider tells you not to drink. You are pregnant, may be pregnant, or are planning to become pregnant. If you drink alcohol: Limit how much you have to: 0-1 drink a day. Know how much alcohol is in your drink. In the [place] one drink equals one 12 oz bottle of beer (355 mL), one 5 oz glass of wine (148 mL), or one 1 oz glass of hard liquor (44 mL). Lifestyle Do not use any products that contain nicotine or tobacco. These products include cigarettes, chewing tobacco, and vaping devices, such as e-cigarettes. If you need help quitting, ask your health care provider. Do not use street drugs. Do not share needles. Ask your health care provider for help if you need support or information about quitting drugs. General instructions Schedule regular health, dental, and eye exams. Stay current with your vaccines. Tell your health care provider if: You often feel depressed. You have ever been abused or do not feel safe at home. Summary Adopting a healthy lifestyle and getting preventive care are important in promoting health and wellness. Follow your health care provider's instructions about healthy diet, exercising, and getting tested or screened for  diseases. Follow your health care provider's instructions on monitoring your cholesterol and blood pressure. This information is not intended to replace advice given to you by your health care provider. Make sure you discuss any questions you have with your health care provider. Document Revised: 10/09/2020 Document Reviewed: 10/09/2020 Elsevier Patient Education  [place].

## 2022-09-18 LAB — HEPATITIS C ANTIBODY: Hepatitis C Ab: NONREACTIVE

## 2022-09-18 LAB — HIV ANTIBODY (ROUTINE TESTING W REFLEX): HIV 1&2 Ab, 4th Generation: NONREACTIVE

## 2022-09-20 ENCOUNTER — Ambulatory Visit (AMBULATORY_SURGERY_CENTER): Payer: 59 | Admitting: Internal Medicine

## 2022-09-20 ENCOUNTER — Encounter: Payer: Self-pay | Admitting: Internal Medicine

## 2022-09-20 VITALS — BP 116/81 | HR 74 | Temp 98.9°F | Resp 21

## 2022-09-20 DIAGNOSIS — D123 Benign neoplasm of transverse colon: Secondary | ICD-10-CM

## 2022-09-20 DIAGNOSIS — Z86010 Personal history of colonic polyps: Secondary | ICD-10-CM

## 2022-09-20 DIAGNOSIS — Z1211 Encounter for screening for malignant neoplasm of colon: Secondary | ICD-10-CM | POA: Diagnosis not present

## 2022-09-20 DIAGNOSIS — D122 Benign neoplasm of ascending colon: Secondary | ICD-10-CM | POA: Diagnosis not present

## 2022-09-20 DIAGNOSIS — G4733 Obstructive sleep apnea (adult) (pediatric): Secondary | ICD-10-CM | POA: Diagnosis not present

## 2022-09-20 DIAGNOSIS — Z09 Encounter for follow-up examination after completed treatment for conditions other than malignant neoplasm: Secondary | ICD-10-CM

## 2022-09-20 DIAGNOSIS — D124 Benign neoplasm of descending colon: Secondary | ICD-10-CM | POA: Diagnosis not present

## 2022-09-20 MED ORDER — SODIUM CHLORIDE 0.9 % IV SOLN
500.0000 mL | Freq: Once | INTRAVENOUS | Status: DC
Start: 2022-09-20 — End: 2022-09-20

## 2022-09-20 NOTE — Progress Notes (Signed)
HISTORY OF PRESENT ILLNESS:  Gabrielle Cross is a 57 y.o. female with history of multiple adenomatous colon polyps in 2021.  Presents today for follow-up  REVIEW OF SYSTEMS:  All non-GI ROS negative except for  Past Medical History:  Diagnosis Date   Arthritis    Blood transfusion without reported diagnosis 2002   Carpal tunnel syndrome, bilateral    Elevated hemoglobin    Incompetent cervix    DELIVERED AT 31 WEEKS   Migraines    OSA (obstructive sleep apnea)    Rx for autopap, does not use   Ruptured ectopic pregnancy 2002   WITH BLOOD TRANSFUSION 2U'S PRBC'S   Wears glasses     Past Surgical History:  Procedure Laterality Date   CERCLAGE PLACEMENT     DILATATION & CURETTAGE/HYSTEROSCOPY WITH MYOSURE N/A 04/19/2022   Procedure: DILATATION & CURETTAGE/HYSTEROSCOPY WITH MYOSURE;  Surgeon: Genia Del, MD;  Location: [facility];  Service: Gynecology;  Laterality: N/A;   RESECTOSCOPIC POLYPECTOMY  2001   SALPINGECTOMY Right    RIGHT, RUPTURED ECTOPIC   SKIN CANCER EXCISION  2020   tangential bx  02/22/2019    Social History Gabrielle Cross  reports that she has never smoked. She has never used smokeless tobacco. She reports current alcohol use. She reports that she does not use drugs.  family history includes Colon polyps in her brother, mother, and sister; Hyperlipidemia in her father.  Allergies  Allergen Reactions   Shellfish Allergy Diarrhea       PHYSICAL EXAMINATION: Vital signs: BP 126/75   Pulse 91   Temp 98.9 F (37.2 C)   Resp 14   SpO2 99%  General: Well-developed, well-nourished, no acute distress HEENT: Sclerae are anicteric, conjunctiva pink. Oral mucosa intact Lungs: Clear Heart: Regular Abdomen: soft, nontender, nondistended, no obvious ascites, no peritoneal signs, normal bowel sounds. No organomegaly. Extremities: No edema Psychiatric: alert and oriented x3. Cooperative     ASSESSMENT:  Multiple adenomatous  polyps   PLAN:   Surveillance colonoscopy

## 2022-09-20 NOTE — Progress Notes (Signed)
Called to room to assist during endoscopic procedure.  Patient ID and intended procedure confirmed with present staff. Received instructions for my participation in the procedure from the performing physician.  

## 2022-09-20 NOTE — Op Note (Signed)
[facility] Patient Name: Gabrielle Cross Procedure Date: 09/20/2022 8:00 AM MRN: [id number] Endoscopist: Wilhemina Bonito. Marina Goodell , MD, [id number] Age: 57 Referring MD:  Date of Birth: 03-20-1966 Gender: Female Account #: [id number] Procedure:                Colonoscopy with cold snare polypectomy x 2; biopsy                            polypectomy x 1 Indications:              High risk colon cancer surveillance: Personal                            history of multiple (3 or more) adenomas. Previous                            examination April 2021, Medicines:                Monitored Anesthesia Care Procedure:                Pre-Anesthesia Assessment:                           - Prior to the procedure, a History and Physical                            was performed, and patient medications and                            allergies were reviewed. The patient's tolerance of                            previous anesthesia was also reviewed. The risks                            and benefits of the procedure and the sedation                            options and risks were discussed with the patient.                            All questions were answered, and informed consent                            was obtained. Prior Anticoagulants: The patient has                            taken no anticoagulant or antiplatelet agents. ASA                            Grade Assessment: II - A patient with mild systemic                            disease. After reviewing the risks and benefits,  the patient was deemed in satisfactory condition to                            undergo the procedure.                           After obtaining informed consent, the colonoscope                            was passed under direct vision. Throughout the                            procedure, the patient's blood pressure, pulse, and                            oxygen saturations were  monitored continuously. The                            CF [id number] #[id number] was introduced through the anus                            and advanced to the the cecum, identified by                            appendiceal orifice and ileocecal valve. The                            ileocecal valve, appendiceal orifice, and rectum                            were photographed. The quality of the bowel                            preparation was excellent. The colonoscopy was                            performed without difficulty. The patient tolerated                            the procedure well. The bowel preparation used was                            SUPREP via split dose instruction. Scope In: 8:25:35 AM Scope Out: 8:41:17 AM Scope Withdrawal Time: 0 hours 13 minutes 32 seconds  Total Procedure Duration: 0 hours 15 minutes 42 seconds  Findings:                 A 1 mm polyp was found in the transverse colon. The                            polyp was removed with a jumbo cold forceps.                            Resection and retrieval were complete.  Two polyps were found in the descending colon and                            ascending colon. The polyps were 5 to 10 mm in                            size. These polyps were removed with a cold snare.                            Resection and retrieval were complete.                           The exam was otherwise without abnormality on                            direct and retroflexion views. Complications:            No immediate complications. Estimated blood loss:                            None. Estimated Blood Loss:     Estimated blood loss: none. Impression:               - One 1 mm polyp in the transverse colon, removed                            with a jumbo cold forceps. Resected and retrieved.                           - Two 5 to 10 mm polyps in the descending colon and                            in the  ascending colon, removed with a cold snare.                            Resected and retrieved.                           - The examination was otherwise normal on direct                            and retroflexion views. Recommendation:           - Repeat colonoscopy in 3 years for surveillance.                           - Patient has a contact number available for                            emergencies. The signs and symptoms of potential                            delayed complications were discussed with the  patient. Return to normal activities tomorrow.                            Written discharge instructions were provided to the                            patient.                           - Resume previous diet.                           - Continue present medications.                           - Await pathology results. Wilhemina Bonito. Marina Goodell, MD 09/20/2022 9:00:56 AM This report has been signed electronically.

## 2022-09-20 NOTE — Progress Notes (Signed)
A and O x3. Report to RN. Tolerated MAC anesthesia well. 

## 2022-09-20 NOTE — Progress Notes (Signed)
Pt's states no medical or surgical changes since previsit or office visit. 

## 2022-09-20 NOTE — Patient Instructions (Addendum)
Handout provided on polyps.  Resume previous diet.  Continue present medications.  Await pathology results.  Repeat colonoscopy in 3 years for surveillance.   YOU HAD AN ENDOSCOPIC PROCEDURE TODAY AT THE [facility]:   Refer to the procedure report that was given to you for any specific questions about what was found during the examination.  If the procedure report does not answer your questions, please call your gastroenterologist to clarify.  If you requested that your care partner not be given the details of your procedure findings, then the procedure report has been included in a sealed envelope for you to review at your convenience later.  YOU SHOULD EXPECT: Some feelings of bloating in the abdomen. Passage of more gas than usual.  Walking can help get rid of the air that was put into your GI tract during the procedure and reduce the bloating. If you had a lower endoscopy (such as a colonoscopy or flexible sigmoidoscopy) you may notice spotting of blood in your stool or on the toilet paper. If you underwent a bowel prep for your procedure, you may not have a normal bowel movement for a few days.  Please Note:  You might notice some irritation and congestion in your nose or some drainage.  This is from the oxygen used during your procedure.  There is no need for concern and it should clear up in a day or so.  SYMPTOMS TO REPORT IMMEDIATELY:  Following lower endoscopy (colonoscopy or flexible sigmoidoscopy):  Excessive amounts of blood in the stool  Significant tenderness or worsening of abdominal pains  Swelling of the abdomen that is new, acute  Fever of 100F or higher  For urgent or emergent issues, a gastroenterologist can be reached at any hour by calling [phone]. Do not use MyChart messaging for urgent concerns.    DIET:  We do recommend a small meal at first, but then you may proceed to your regular diet.  Drink plenty of fluids but you should avoid alcoholic  beverages for 24 hours.  ACTIVITY:  You should plan to take it easy for the rest of today and you should NOT DRIVE or use heavy machinery until tomorrow (because of the sedation medicines used during the test).    FOLLOW UP: Our staff will call the number listed on your records the next business day following your procedure.  We will call around 7:15- 8:00 am to check on you and address any questions or concerns that you may have regarding the information given to you following your procedure. If we do not reach you, we will leave a message.     If any biopsies were taken you will be contacted by phone or by letter within the next 1-3 weeks.  Please call us at [phone] if you have not heard about the biopsies in 3 weeks.    SIGNATURES/CONFIDENTIALITY: You and/or your care partner have signed paperwork which will be entered into your electronic medical record.  These signatures attest to the fact that that the information above on your After Visit Summary has been reviewed and is understood.  Full responsibility of the confidentiality of this discharge information lies with you and/or your care-partner.

## 2022-09-23 ENCOUNTER — Telehealth: Payer: Self-pay | Admitting: *Deleted

## 2022-09-23 NOTE — Telephone Encounter (Signed)
No answer on  follow up call. Left message.   

## 2022-09-24 ENCOUNTER — Encounter: Payer: Self-pay | Admitting: Internal Medicine

## 2022-09-25 ENCOUNTER — Other Ambulatory Visit: Payer: Self-pay | Admitting: Radiology

## 2022-09-25 ENCOUNTER — Other Ambulatory Visit (HOSPITAL_COMMUNITY): Payer: Self-pay

## 2022-09-25 ENCOUNTER — Other Ambulatory Visit: Payer: Self-pay

## 2022-09-25 DIAGNOSIS — N951 Menopausal and female climacteric states: Secondary | ICD-10-CM

## 2022-09-25 MED ORDER — PROGESTERONE MICRONIZED 100 MG PO CAPS
100.0000 mg | ORAL_CAPSULE | Freq: Every day | ORAL | 1 refills | Status: DC
Start: 2022-09-25 — End: 2023-04-01
  Filled 2022-09-25 – 2022-09-30 (×3): qty 90, 90d supply, fill #0
  Filled 2023-01-01: qty 90, 90d supply, fill #1

## 2022-09-25 NOTE — Telephone Encounter (Signed)
RF request received for Prometrium .  Last ov 09/13/22.  Last AEX 09/05/20.  Advise if ok for refill.

## 2022-09-27 ENCOUNTER — Other Ambulatory Visit: Payer: Self-pay

## 2022-09-27 ENCOUNTER — Other Ambulatory Visit (HOSPITAL_COMMUNITY): Payer: Self-pay

## 2022-09-30 ENCOUNTER — Other Ambulatory Visit (HOSPITAL_COMMUNITY): Payer: Self-pay

## 2022-11-22 ENCOUNTER — Ambulatory Visit: Payer: 59

## 2022-11-27 ENCOUNTER — Other Ambulatory Visit (HOSPITAL_COMMUNITY): Payer: Self-pay

## 2022-11-29 ENCOUNTER — Ambulatory Visit (INDEPENDENT_AMBULATORY_CARE_PROVIDER_SITE_OTHER): Payer: 59

## 2022-11-29 DIAGNOSIS — Z23 Encounter for immunization: Secondary | ICD-10-CM

## 2022-11-29 NOTE — Progress Notes (Signed)
Pt received her 2nd part shingles vaccine in her right arm and she responded well.

## 2023-01-07 ENCOUNTER — Other Ambulatory Visit (HOSPITAL_COMMUNITY): Payer: Self-pay

## 2023-01-07 ENCOUNTER — Ambulatory Visit (INDEPENDENT_AMBULATORY_CARE_PROVIDER_SITE_OTHER): Payer: 59 | Admitting: Radiology

## 2023-01-07 DIAGNOSIS — N951 Menopausal and female climacteric states: Secondary | ICD-10-CM

## 2023-01-07 MED ORDER — FLUOXETINE HCL 10 MG PO CAPS
10.0000 mg | ORAL_CAPSULE | Freq: Every day | ORAL | 0 refills | Status: DC
Start: 2023-01-07 — End: 2023-04-01
  Filled 2023-01-07: qty 90, 90d supply, fill #0

## 2023-01-07 NOTE — Progress Notes (Signed)
   Gabrielle Cross 1965/09/03 [id number]   History: Postmenopausal 57 y.o. presents for follow up after taking prozac for menopausal symptoms. She stopped after she ran out last month. She originally presented with mood changes with menopause. Increased anxiety, anger, lack of interest and fatigue. Low libido. Hx of migraine with aura. Currently on progesterone to help with sleep. S/p D&C for PMB; benign endometrial polyp 11/23.   Gynecologic History Postmenopausal Last Pap: 09/05/20. Results were: normal Last mammogram: 08/2022. Results were: normal   Obstetric History OB History  Gravida Para Term Preterm AB Living  2 1   1 1 1   SAB IAB Ectopic Multiple Live Births      1   1    # Outcome Date GA Lbr Len/2nd Weight Sex Type Anes PTL Lv  2 Ectopic           1 Preterm     F Vag-Spont  Y LIV     The following portions of the patient's history were reviewed and updated as appropriate: allergies, current medications, past family history, past medical history, past social history, past surgical history, and problem list.  Review of Systems Pertinent items noted in HPI and remainder of comprehensive ROS otherwise negative.  Past medical history, past surgical history, family history and social history were all reviewed and documented in the EPIC chart.  Exam:  Vitals:   01/07/23 1456 01/07/23 1501  BP: (!) 150/92 (!) 146/90  Weight:  182 lb 9.6 oz (82.8 kg)   Body mass index is 35.66 kg/m.  Physical Exam Vitals and nursing note reviewed.  Constitutional:      Appearance: Normal appearance. She is normal weight.  Pulmonary:     Effort: Pulmonary effort is normal.  Neurological:     Mental Status: She is alert and oriented to person, place, and time. Mental status is at baseline.  Psychiatric:        Mood and Affect: Mood normal.        Thought Content: Thought content normal.        Judgment: Judgment normal.      Assessment/Plan:   1. Menopausal syndrome restart -  FLUoxetine (PROZAC) 10 MG capsule; Take 1 capsule (10 mg total) by mouth daily.  Dispense: 90 capsule; Refill: 0  - Magnesium and vit D nightly - Methyl b12 for energy in the AM   Dashon Mcintire B WHNP-BC, 3:38 PM 01/07/2023

## 2023-04-01 ENCOUNTER — Ambulatory Visit (INDEPENDENT_AMBULATORY_CARE_PROVIDER_SITE_OTHER): Payer: 59 | Admitting: Radiology

## 2023-04-01 ENCOUNTER — Other Ambulatory Visit (HOSPITAL_COMMUNITY): Payer: Self-pay

## 2023-04-01 VITALS — BP 164/98

## 2023-04-01 DIAGNOSIS — R030 Elevated blood-pressure reading, without diagnosis of hypertension: Secondary | ICD-10-CM | POA: Diagnosis not present

## 2023-04-01 DIAGNOSIS — N951 Menopausal and female climacteric states: Secondary | ICD-10-CM | POA: Diagnosis not present

## 2023-04-01 MED ORDER — HYDROCHLOROTHIAZIDE 12.5 MG PO TABS
12.5000 mg | ORAL_TABLET | Freq: Every day | ORAL | 0 refills | Status: DC
  Filled 2023-04-01: qty 30, 30d supply, fill #0

## 2023-04-01 MED ORDER — PROGESTERONE MICRONIZED 100 MG PO CAPS
100.0000 mg | ORAL_CAPSULE | Freq: Every day | ORAL | 1 refills | Status: DC
Start: 2023-04-01 — End: 2023-05-08
  Filled 2023-04-01: qty 90, 90d supply, fill #0

## 2023-04-01 MED ORDER — FLUOXETINE HCL 10 MG PO CAPS
10.0000 mg | ORAL_CAPSULE | Freq: Every day | ORAL | 1 refills | Status: DC
Start: 2023-04-01 — End: 2023-05-08
  Filled 2023-04-01: qty 90, 90d supply, fill #0

## 2023-04-01 NOTE — Progress Notes (Signed)
   Gabrielle Cross 02-Jul-1965 [id number]   History: Postmenopausal 57 y.o. presents for follow up after taking prozac for menopausal symptoms. She originally presented with mood changes with menopause. Improvement in anxiety, anger, lack of interest. Still has daily fatigue. Low libido has improved. Hx of migraine with aura. Currently on progesterone to help with sleep. S/p D&C for PMB; benign endometrial polyp 11/23.   Gynecologic History Postmenopausal Last Pap: 09/05/20. Results were: normal Last mammogram: 08/2022. Results were: normal   Obstetric History OB History  Gravida Para Term Preterm AB Living  2 1   1 1 1   SAB IAB Ectopic Multiple Live Births      1   1    # Outcome Date GA Lbr Len/2nd Weight Sex Type Anes PTL Lv  2 Ectopic           1 Preterm     F Vag-Spont  Y LIV     The following portions of the patient's history were reviewed and updated as appropriate: allergies, current medications, past family history, past medical history, past social history, past surgical history, and problem list.  Review of Systems Pertinent items noted in HPI and remainder of comprehensive ROS otherwise negative.  Past medical history, past surgical history, family history and social history were all reviewed and documented in the EPIC chart.  Exam:     04/01/2023    8:35 AM 01/07/2023    3:01 PM 01/07/2023    2:56 PM  Vitals with BMI  Weight  182 lbs 10 oz   Systolic 164 146 657  Diastolic 98 90 92     There is no height or weight on file to calculate BMI.  Physical Exam Vitals and nursing note reviewed.  Constitutional:      Appearance: Normal appearance. She is normal weight.  Pulmonary:     Effort: Pulmonary effort is normal.  Neurological:     Mental Status: She is alert and oriented to person, place, and time. Mental status is at baseline.  Psychiatric:        Mood and Affect: Mood normal.        Thought Content: Thought content normal.        Judgment: Judgment  normal.      Assessment/Plan:   1. Menopausal syndrome - FLUoxetine (PROZAC) 10 MG capsule; Take 1 capsule (10 mg total) by mouth daily.  Dispense: 90 capsule; Refill: 1 - progesterone (PROMETRIUM) 100 MG capsule; Take 1 capsule (100 mg total) by mouth daily.  Dispense: 90 capsule; Refill: 1 - begin methyl b12 for energy in the AM, would also consider adding wellbutrin  2. Elevated blood pressure reading without diagnosis of hypertension Begin hydrochlorothiazide and f/u for BP check in 4 weeks - hydrochlorothiazide (HYDRODIURIL) 12.5 MG tablet; Take 1 tablet (12.5 mg total) by mouth daily.  Dispense: 30 tablet; Refill: 0 - Increase exercise including weight bearing to help with BP and weight loss    Arlie Solomons B WHNP-BC, 9:14 AM 04/01/2023

## 2023-04-02 ENCOUNTER — Other Ambulatory Visit (HOSPITAL_COMMUNITY): Payer: Self-pay

## 2023-05-08 ENCOUNTER — Encounter: Payer: Self-pay | Admitting: Radiology

## 2023-05-08 ENCOUNTER — Ambulatory Visit (INDEPENDENT_AMBULATORY_CARE_PROVIDER_SITE_OTHER): Payer: 59 | Admitting: Radiology

## 2023-05-08 ENCOUNTER — Other Ambulatory Visit (HOSPITAL_COMMUNITY): Payer: Self-pay

## 2023-05-08 VITALS — BP 152/96 | Ht 60.0 in | Wt 185.0 lb

## 2023-05-08 DIAGNOSIS — R030 Elevated blood-pressure reading, without diagnosis of hypertension: Secondary | ICD-10-CM | POA: Diagnosis not present

## 2023-05-08 DIAGNOSIS — Z01419 Encounter for gynecological examination (general) (routine) without abnormal findings: Secondary | ICD-10-CM

## 2023-05-08 DIAGNOSIS — N951 Menopausal and female climacteric states: Secondary | ICD-10-CM

## 2023-05-08 MED ORDER — PROGESTERONE MICRONIZED 100 MG PO CAPS
100.0000 mg | ORAL_CAPSULE | Freq: Every day | ORAL | 4 refills | Status: DC
  Filled 2023-05-08 – 2023-07-16 (×2): qty 90, 90d supply, fill #0
  Filled 2023-10-08: qty 90, 90d supply, fill #1

## 2023-05-08 MED ORDER — FLUOXETINE HCL 10 MG PO CAPS
10.0000 mg | ORAL_CAPSULE | Freq: Every day | ORAL | 4 refills | Status: DC
  Filled 2023-05-08: qty 90, 90d supply, fill #0

## 2023-05-08 MED ORDER — HYDROCHLOROTHIAZIDE 12.5 MG PO TABS
12.5000 mg | ORAL_TABLET | Freq: Every day | ORAL | 4 refills | Status: AC
Start: 2023-05-08 — End: ?
  Filled 2023-05-08: qty 90, 90d supply, fill #0
  Filled 2023-07-16: qty 90, 90d supply, fill #1
  Filled 2023-11-07 (×2): qty 90, 90d supply, fill #2
  Filled 2024-01-16: qty 90, 90d supply, fill #3
  Filled 2024-04-25: qty 90, 90d supply, fill #4

## 2023-05-08 NOTE — Progress Notes (Signed)
   Gabrielle Cross November 03, 1965 [id number]   History: Postmenopausal 57 y.o. presents for annual exam. Doing well on current med regimen, needs refills. No new gyn concerns. Ran out of BP meds 6 days ago.   Gynecologic History Postmenopausal Last Pap: 2022. Results were: normal Last mammogram: 08/2022. Results were: normal Last colonoscopy: 09/2022   Obstetric History OB History  Gravida Para Term Preterm AB Living  2 1   1 1 1   SAB IAB Ectopic Multiple Live Births      1   1    # Outcome Date GA Lbr Len/2nd Weight Sex Type Anes PTL Lv  2 Ectopic           1 Preterm     F Vag-Spont  Y LIV     The following portions of the patient's history were reviewed and updated as appropriate: allergies, current medications, past family history, past medical history, past social history, past surgical history, and problem list.  Review of Systems Pertinent items noted in HPI and remainder of comprehensive ROS otherwise negative.  Past medical history, past surgical history, family history and social history were all reviewed and documented in the EPIC chart.  Exam:  Vitals:   05/08/23 0757 05/08/23 0800  BP: (!) 158/90 (!) 152/96  Weight: 185 lb (83.9 kg)   Height: 5' (1.524 m)    Body mass index is 36.13 kg/m.  General appearance:  Normal Thyroid:  Symmetrical, normal in size, without palpable masses or nodularity. Respiratory  Auscultation:  Clear without wheezing or rhonchi Cardiovascular  Auscultation:  Regular rate, without rubs, murmurs or gallops  Edema/varicosities:  Not grossly evident Abdominal  Soft,nontender, without masses, guarding or rebound.  Liver/spleen:  No organomegaly noted  Hernia:  None appreciated  Skin  Inspection:  Grossly normal Breasts: Examined lying and sitting.   Right: Without masses, retractions, nipple discharge or axillary adenopathy.   Left: Without masses, retractions, nipple discharge or axillary adenopathy. Genitourinary    Inguinal/mons:  Normal without inguinal adenopathy  External genitalia:  Normal appearing vulva with no masses, tenderness, or lesions  BUS/Urethra/Skene's glands:  Normal  Vagina:  Normal appearing with normal color and discharge, no lesions. Atrophy: mild   Cervix:  Normal appearing without discharge or lesions  Uterus:  Normal in size, shape and contour.  Midline and mobile, nontender  Adnexa/parametria:     Rt: Normal in size, without masses or tenderness.   Lt: Normal in size, without masses or tenderness.  Anus and perineum: Normal    Raynelle Fanning, CMA present for exam  Assessment/Plan:   1. Well woman exam with routine gynecological exam Pap 2027  2. Elevated blood pressure reading without diagnosis of hypertension Ran out of BP meds, has not taken in 6 days - hydrochlorothiazide (HYDRODIURIL) 12.5 MG tablet; Take 1 tablet (12.5 mg total) by mouth daily.  Dispense: 90 tablet; Refill: 4  3. Menopausal syndrome  - FLUoxetine (PROZAC) 10 MG capsule; Take 1 capsule (10 mg total) by mouth daily.  Dispense: 90 capsule; Refill: 4 - progesterone (PROMETRIUM) 100 MG capsule; Take 1 capsule (100 mg total) by mouth daily.  Dispense: 90 capsule; Refill: 4    Return in 1 year for annual or sooner prn.  Arlie Solomons B WHNP-BC, 8:40 AM 05/08/2023

## 2023-06-17 ENCOUNTER — Ambulatory Visit (INDEPENDENT_AMBULATORY_CARE_PROVIDER_SITE_OTHER): Payer: 59 | Admitting: Radiology

## 2023-06-17 VITALS — BP 134/84

## 2023-06-17 DIAGNOSIS — N951 Menopausal and female climacteric states: Secondary | ICD-10-CM

## 2023-06-17 NOTE — Progress Notes (Signed)
   Gabrielle Cross 02/24/1966 [id number]   History: Postmenopausal 58 y.o. presents to discuss weaning off prozac . Feels numb, trouble finding joy in daily activities.    Gynecologic History Postmenopausal Up to date on all screenings  Obstetric History OB History  Gravida Para Term Preterm AB Living  2 1  1 1 1   SAB IAB Ectopic Multiple Live Births    1  1    # Outcome Date GA Lbr Len/2nd Weight Sex Type Anes PTL Lv  2 Ectopic           1 Preterm     F Vag-Spont  Y LIV     The following portions of the patient's history were reviewed and updated as appropriate: allergies, current medications, past family history, past medical history, past social history, past surgical history, and problem list.  Review of Systems Pertinent items noted in HPI and remainder of comprehensive ROS otherwise negative.  Past medical history, past surgical history, family history and social history were all reviewed and documented in the EPIC chart.  Exam:  Vitals:   06/17/23 0813  BP: 134/84   There is no height or weight on file to calculate BMI.  Physical Exam Vitals and nursing note reviewed.  Constitutional:      Appearance: Normal appearance. She is normal weight.  Neurological:     General: No focal deficit present.     Mental Status: She is alert and oriented to person, place, and time.  Psychiatric:        Mood and Affect: Mood normal.        Thought Content: Thought content normal.        Judgment: Judgment normal.      Assessment/Plan:   1. Menopausal syndrome (Primary) Begin taking prozac  every other night x 1 week Declines other treatment at this time  Brycelyn Gambino B WHNP-BC, 11:07 AM 06/17/2023

## 2023-07-01 ENCOUNTER — Other Ambulatory Visit (HOSPITAL_COMMUNITY): Payer: Self-pay

## 2023-07-01 MED ORDER — RIZATRIPTAN BENZOATE 10 MG PO TABS
10.0000 mg | ORAL_TABLET | ORAL | 5 refills | Status: DC | PRN
  Filled 2023-07-01: qty 4, 7d supply, fill #0

## 2023-07-01 MED ORDER — IMIPRAMINE HCL 25 MG PO TABS
75.0000 mg | ORAL_TABLET | Freq: Every day | ORAL | 5 refills | Status: DC
  Filled 2023-07-01: qty 90, 30d supply, fill #0
  Filled 2023-07-16: qty 90, 30d supply, fill #1
  Filled 2023-08-25 – 2023-09-24 (×2): qty 90, 30d supply, fill #2
  Filled 2023-11-17: qty 90, 30d supply, fill #3

## 2023-07-02 ENCOUNTER — Other Ambulatory Visit (HOSPITAL_COMMUNITY): Payer: Self-pay

## 2023-07-16 ENCOUNTER — Other Ambulatory Visit (HOSPITAL_COMMUNITY): Payer: Self-pay

## 2023-07-17 ENCOUNTER — Other Ambulatory Visit (HOSPITAL_COMMUNITY): Payer: Self-pay

## 2023-07-25 ENCOUNTER — Other Ambulatory Visit: Payer: Self-pay

## 2023-07-25 ENCOUNTER — Other Ambulatory Visit (HOSPITAL_COMMUNITY): Payer: Self-pay

## 2023-08-15 ENCOUNTER — Other Ambulatory Visit: Payer: Self-pay | Admitting: Internal Medicine

## 2023-08-15 DIAGNOSIS — Z1231 Encounter for screening mammogram for malignant neoplasm of breast: Secondary | ICD-10-CM

## 2023-08-29 DIAGNOSIS — Z1231 Encounter for screening mammogram for malignant neoplasm of breast: Secondary | ICD-10-CM

## 2023-09-03 ENCOUNTER — Other Ambulatory Visit (HOSPITAL_COMMUNITY): Payer: Self-pay

## 2023-09-12 ENCOUNTER — Ambulatory Visit
Admission: RE | Admit: 2023-09-12 | Discharge: 2023-09-12 | Disposition: A | Discharge status: Home / Self Care | Payer: 59 | Source: Ambulatory Visit

## 2023-09-12 DIAGNOSIS — Z1231 Encounter for screening mammogram for malignant neoplasm of breast: Secondary | ICD-10-CM

## 2023-09-18 ENCOUNTER — Other Ambulatory Visit: Payer: Self-pay | Admitting: Internal Medicine

## 2023-09-18 DIAGNOSIS — R928 Other abnormal and inconclusive findings on diagnostic imaging of breast: Secondary | ICD-10-CM

## 2023-09-25 ENCOUNTER — Other Ambulatory Visit (HOSPITAL_COMMUNITY): Payer: Self-pay

## 2023-09-25 ENCOUNTER — Encounter: Payer: 59 | Admitting: Internal Medicine

## 2023-10-06 ENCOUNTER — Ambulatory Visit
Admission: RE | Admit: 2023-10-06 | Discharge: 2023-10-06 | Disposition: A | Discharge status: Home / Self Care | Source: Ambulatory Visit | Attending: Internal Medicine | Admitting: Internal Medicine

## 2023-10-06 ENCOUNTER — Other Ambulatory Visit: Payer: Self-pay | Admitting: Internal Medicine

## 2023-10-06 DIAGNOSIS — R928 Other abnormal and inconclusive findings on diagnostic imaging of breast: Secondary | ICD-10-CM

## 2023-10-06 DIAGNOSIS — N6312 Unspecified lump in the right breast, upper inner quadrant: Secondary | ICD-10-CM | POA: Diagnosis not present

## 2023-10-06 DIAGNOSIS — R922 Inconclusive mammogram: Secondary | ICD-10-CM | POA: Diagnosis not present

## 2023-10-06 DIAGNOSIS — R590 Localized enlarged lymph nodes: Secondary | ICD-10-CM

## 2023-10-06 DIAGNOSIS — N6310 Unspecified lump in the right breast, unspecified quadrant: Secondary | ICD-10-CM

## 2023-10-06 DIAGNOSIS — N6311 Unspecified lump in the right breast, upper outer quadrant: Secondary | ICD-10-CM | POA: Diagnosis not present

## 2023-10-07 ENCOUNTER — Ambulatory Visit
Admission: RE | Admit: 2023-10-07 | Discharge: 2023-10-07 | Disposition: A | Discharge status: Home / Self Care | Source: Ambulatory Visit | Attending: Internal Medicine | Admitting: Internal Medicine

## 2023-10-07 DIAGNOSIS — C50811 Malignant neoplasm of overlapping sites of right female breast: Secondary | ICD-10-CM | POA: Diagnosis not present

## 2023-10-07 DIAGNOSIS — N6310 Unspecified lump in the right breast, unspecified quadrant: Secondary | ICD-10-CM

## 2023-10-07 DIAGNOSIS — R590 Localized enlarged lymph nodes: Secondary | ICD-10-CM | POA: Diagnosis not present

## 2023-10-07 DIAGNOSIS — N6315 Unspecified lump in the right breast, overlapping quadrants: Secondary | ICD-10-CM | POA: Diagnosis not present

## 2023-10-07 HISTORY — PX: BREAST BIOPSY: SHX20

## 2023-10-08 LAB — SURGICAL PATHOLOGY

## 2023-10-09 ENCOUNTER — Telehealth: Payer: Self-pay | Admitting: *Deleted

## 2023-10-09 NOTE — Telephone Encounter (Signed)
 LVM to patient in reference to upcoming appointment, left my contact to call back, sent an email with appointment information

## 2023-10-14 ENCOUNTER — Other Ambulatory Visit

## 2023-10-19 IMAGING — MG MM DIGITAL DIAGNOSTIC UNILAT*L* W/ TOMO W/ CAD
8 series · 8 of 24 positions shown · non-contrast
Comparison: Previous exam(s).

CLINICAL DATA: Screening recall for a left breast asymmetry.

EXAM:
DIGITAL DIAGNOSTIC UNILATERAL LEFT MAMMOGRAM WITH TOMOSYNTHESIS AND
CAD
TECHNIQUE: Left digital diagnostic mammography and breast tomosynthesis was
performed. The images were evaluated with computer-aided detection.

[L ML synth-2D (1 of 2)]
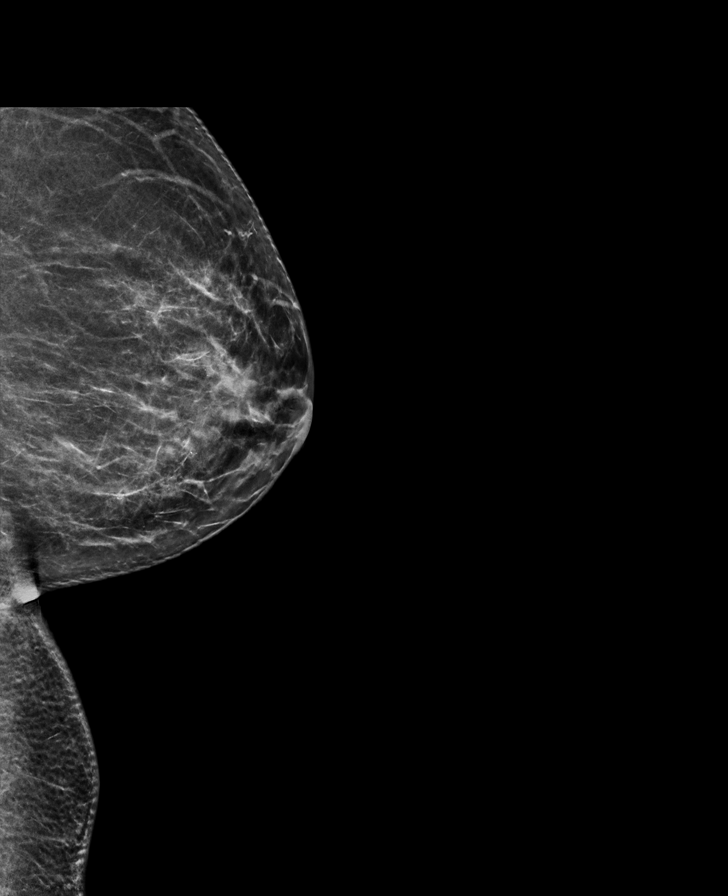

[L MLO synth-2D (1 of 2)]
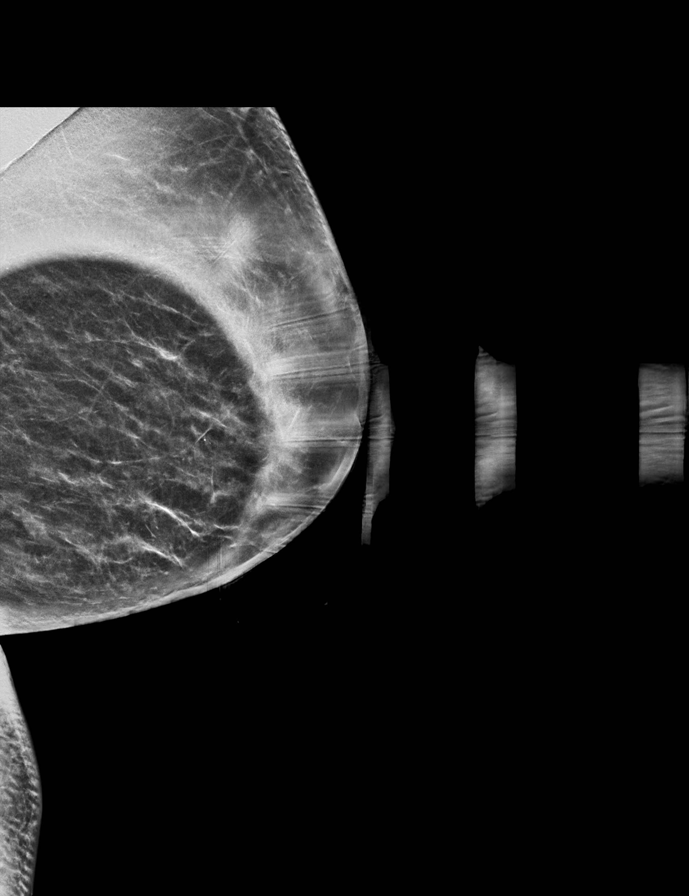

[L MLO synth-2D (2 of 2)]
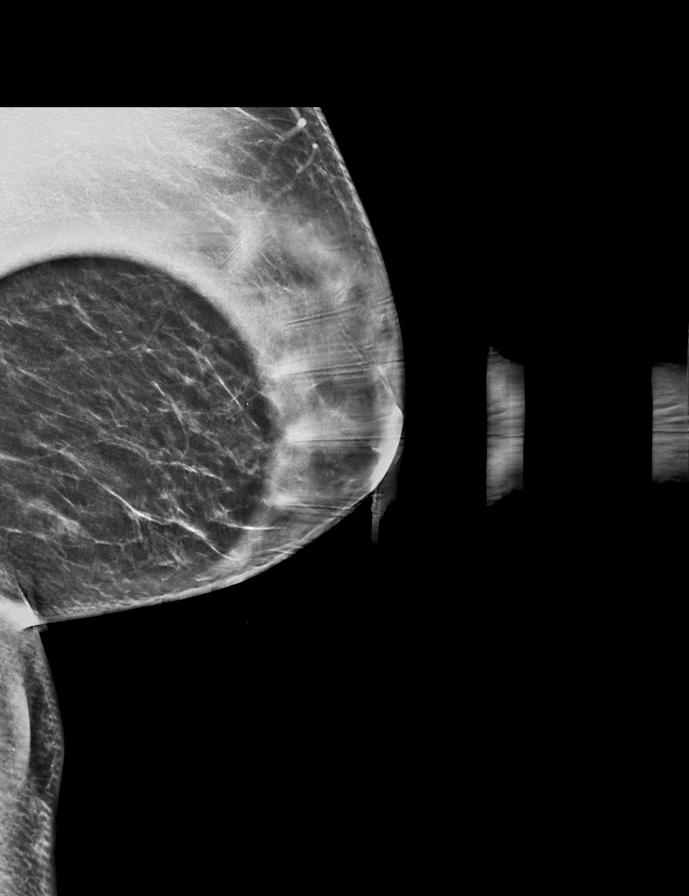

[L ML synth-2D (2 of 2)]
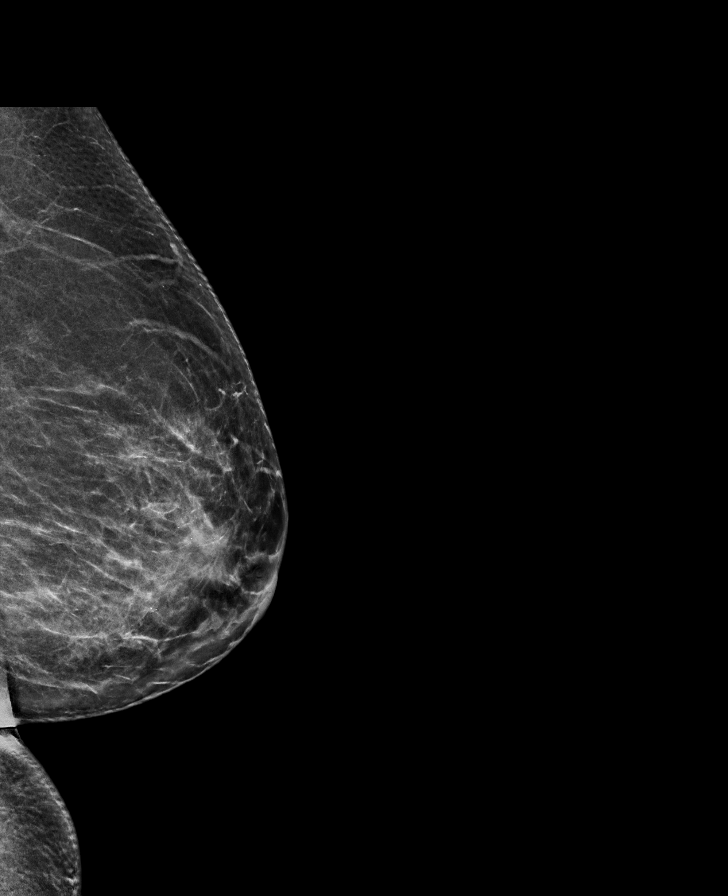

[L ML tomo (1 of 2) · tomo slice 36/71.0]
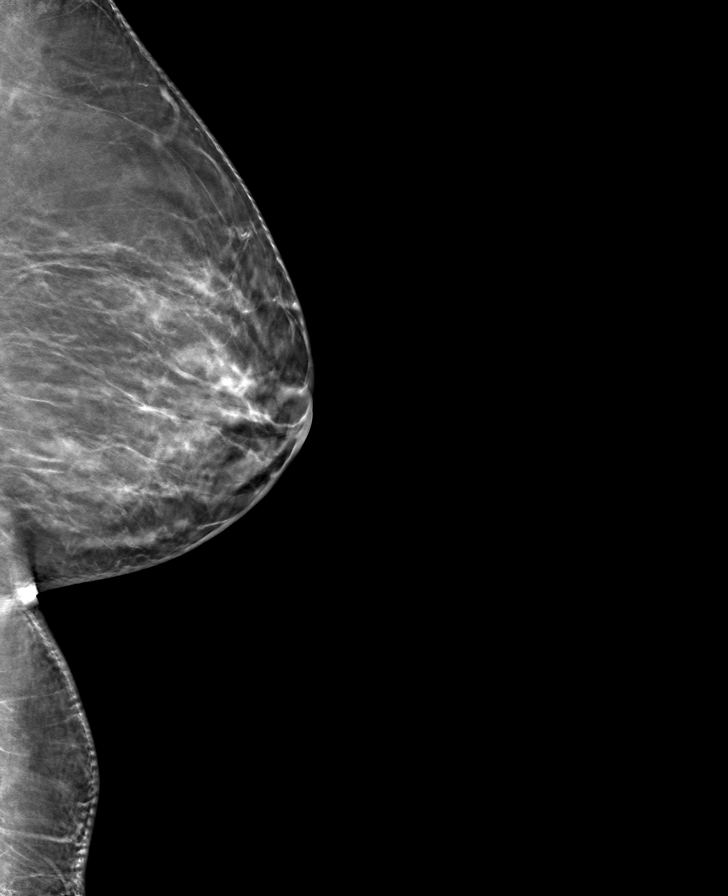

[L ML tomo (2 of 2) · tomo slice 36/71.0]
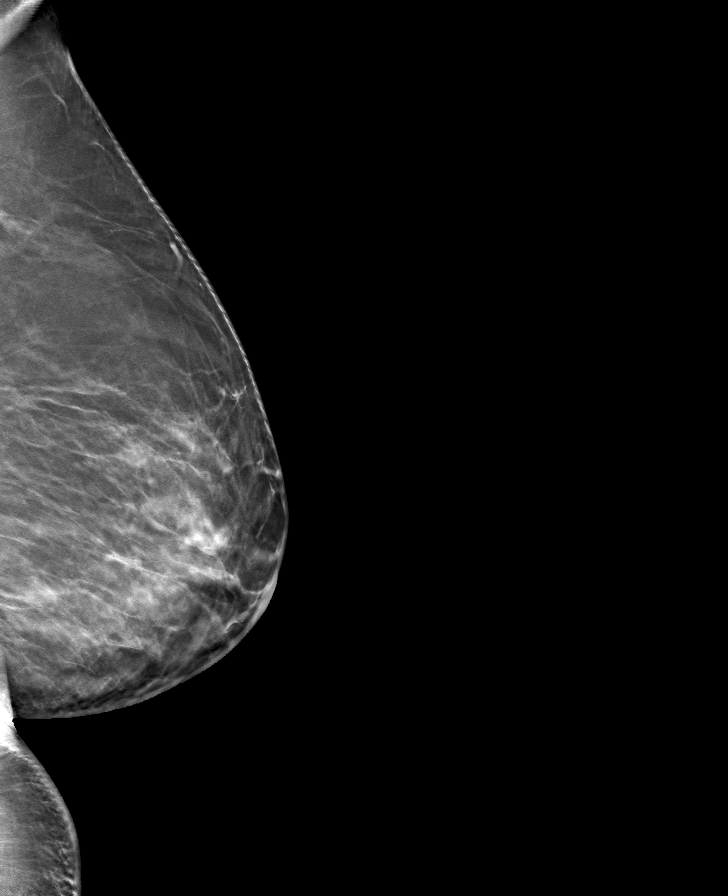

[L MLO tomo (1 of 2) · tomo slice 37/74.0]
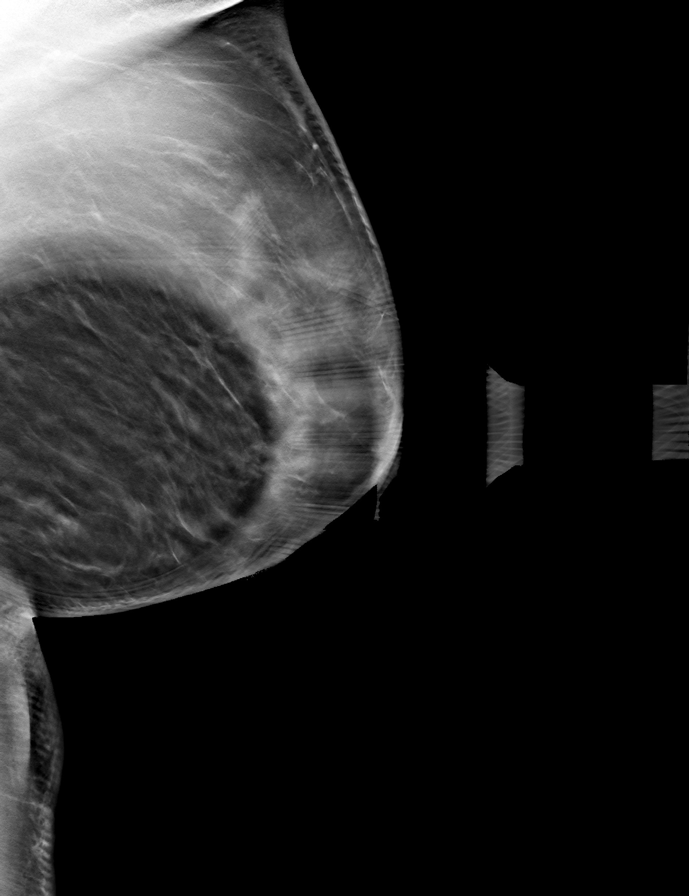

[L MLO tomo (2 of 2) · tomo slice 35/69.0]
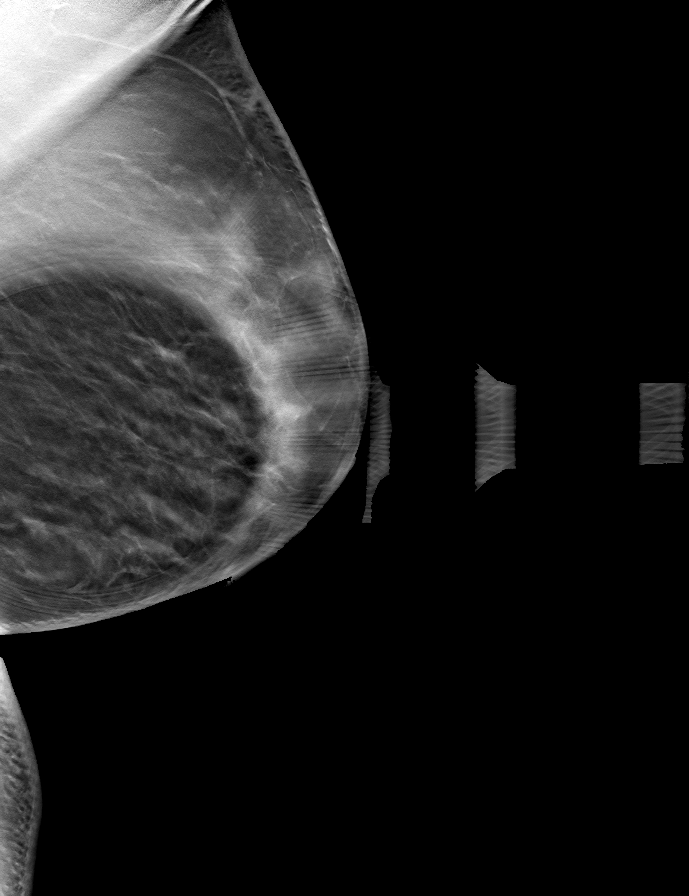

[8 of 24 positions shown; findings below may reference images not displayed]

ACR Breast Density Category b: There are scattered areas of
fibroglandular density.
FINDINGS: The asymmetry in the inferior posterior left breast resolves with
spot compression tomosynthesis imaging. No suspicious asymmetries
are found on the full paddle true lateral view of the left breast.
IMPRESSION: Resolution of the left breast asymmetry consistent with overlapping
fibroglandular tissue.

RECOMMENDATION:
Screening mammogram in one year.(Code:OX-[id number])

I have discussed the findings and recommendations with the patient.
If applicable, a reminder letter will be sent to the patient
regarding the next appointment.

BI-RADS CATEGORY  1: Negative.

## 2023-10-20 ENCOUNTER — Encounter: Payer: Self-pay | Admitting: *Deleted

## 2023-10-20 DIAGNOSIS — Z170 Estrogen receptor positive status [ER+]: Secondary | ICD-10-CM | POA: Insufficient documentation

## 2023-10-21 NOTE — Progress Notes (Signed)
 Radiation Oncology         [phone] ________________________________  Initial Outpatient Consultation  Name: Gabrielle Cross MRN: [id number]  Date: 10/22/2023  DOB: 01/05/1966  WU:[id number], Aram Beagle, NP  Caralyn Chandler, MD   REFERRING PHYSICIAN: Lillette Reid III, MD  DIAGNOSIS:    ICD-10-CM   1. Malignant neoplasm of upper-outer quadrant of right breast in female, estrogen receptor positive (HCC)  C50.411    Z17.0        Cancer Staging  Malignant neoplasm of upper-outer quadrant of right breast in female, estrogen receptor positive (HCC) Staging form: Breast, AJCC 8th Edition - Clinical stage from 10/22/2023: Stage IIIA (cT3, cN0, cM0, G3, ER+, PR-, HER2-) - Signed by Cameron Cea, MD on 10/22/2023 Stage prefix: Initial diagnosis Histologic grading system: 3 grade system Laterality: Right Staged by: Pathologist and managing physician Stage used in treatment planning: Yes National guidelines used in treatment planning: Yes Type of national guideline used in treatment planning: NCCN   Right Breast UOQ, Invasive Ductal Carcinoma, ER+(Weak, 30%) / PR- / Her2-, Grade 3  CHIEF COMPLAINT: Here to discuss management of right breast cancer  HISTORY OF PRESENT ILLNESS::Gabrielle Cross is a 58 y.o. female who presented with a right breast abnormality on the following imaging: bilateral screening mammogram on the date of 09/12/23. No symptoms, if any, were reported at that time.  A right breast diagnostic mammogram and left breast ultrasound was subsequently performed on 10/06/23 which showed a suspicious mass in the 12 o'clock right breast measuring 5.1 cm in the greatest extent, located approximately 3 cmfn, and correlating with a palpable mass noted at the time of this study. A single abnormal right axillary lymph node was also demonstrated, with a focal cortical thickness measuring 7 mm.   Biopsy of the 12 o'clock right breast mass on date of 10/07/23 showed grade 3 invasive ductal  carcinoma measuring 1.6 cm in the greatest linear extent of the sample, with necrosis.  ER status: 30% positive with weak staining intensity; PR status 0% negative; Proliferation marker Ki67 at 60%; Her2 status negative; Grade 3. The thickened right axillary lymph node was also biopsied at that time which showed no evidence of malignancy (with findings consistent with benign breast tissue with reactive lymphoid hyperplasia).   She is here with her significant other.  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  PREVIOUS RADIATION THERAPY: No  PAST MEDICAL HISTORY:  has a past medical history of Arthritis, Blood transfusion without reported diagnosis (2002), Carpal tunnel syndrome, bilateral, Elevated hemoglobin (HCC), Hypertension, Incompetent cervix, Migraines, OSA (obstructive sleep apnea), Ruptured ectopic pregnancy (2002), and Wears glasses.    PAST SURGICAL HISTORY: Past Surgical History:  Procedure Laterality Date   BREAST BIOPSY Right 10/07/2023   US  RT BREAST BX W LOC DEV 1ST LESION IMG BX SPEC US  GUIDE 10/07/2023 [facility] MAMMOGRAPHY   CERCLAGE PLACEMENT     DILATATION & CURETTAGE/HYSTEROSCOPY WITH MYOSURE N/A 04/19/2022   Procedure: DILATATION & CURETTAGE/HYSTEROSCOPY WITH MYOSURE;  Surgeon: Lavoie, Marie-Lyne, MD;  Location: [facility];  Service: Gynecology;  Laterality: N/A;   RESECTOSCOPIC POLYPECTOMY  2001   SALPINGECTOMY Right    RIGHT, RUPTURED ECTOPIC   SKIN CANCER EXCISION  2020   tangential bx  02/22/2019    FAMILY HISTORY: family history includes Colon polyps in her brother, mother, and sister; Hyperlipidemia in her father.  SOCIAL HISTORY:  reports that she has never smoked. She has never been exposed to tobacco smoke.  She has never used smokeless tobacco. She reports current alcohol use. She reports that she does not use drugs.  ALLERGIES: Shellfish allergy  MEDICATIONS:  Current Outpatient Medications  Medication Sig Dispense  Refill   dexamethasone  (DECADRON ) 4 MG tablet With Adriamycin Cytoxan take 1 tablet day after chemo and 1 tablet 2 days after chemo with food 8 tablet 0   hydrochlorothiazide  (HYDRODIURIL ) 12.5 MG tablet Take 1 tablet (12.5 mg total) by mouth daily. 90 tablet 4   imipramine  (TOFRANIL ) 25 MG tablet Take 3 tablets (75 mg total) by mouth daily. 90 tablet 5   lidocaine -prilocaine (EMLA) cream Apply to affected area once 30 g 3   ondansetron  (ZOFRAN ) 8 MG tablet Take 1 tablet (8 mg total) by mouth every 8 (eight) hours as needed for nausea or vomiting. Start on the third day after chemotherapy. 30 tablet 1   prochlorperazine (COMPAZINE) 10 MG tablet Take 1 tablet (10 mg total) by mouth every 6 (six) hours as needed for nausea or vomiting. 30 tablet 1   rizatriptan  (MAXALT ) 10 MG tablet Take 1 tablet (10 mg total) by mouth as needed. may repeat once after 2 hours 4 tablet 5   No current facility-administered medications for this encounter.    REVIEW OF SYSTEMS: As above in HPI.   PHYSICAL EXAM:  vitals were not taken for this visit.   General: Alert and oriented, in no acute distress HEENT: Head is normocephalic. Extraocular movements are intact.  Heart: Regular in rate and rhythm with no murmurs, rubs, or gallops. Chest: Clear to auscultation bilaterally, with no rhonchi, wheezes, or rales. Abdomen: Soft, nontender, nondistended, with no rigidity or guarding. Extremities: No cyanosis or edema. Skin: No concerning lesions. Musculoskeletal: symmetric strength and muscle tone throughout. Neurologic: Cranial nerves II through XII are grossly intact. No obvious focalities. Speech is fluent. Coordination is intact. Psychiatric: Judgment and insight are intact. Affect is appropriate. Breasts: The right breast is notable for a central 6 cm mass. No other palpable masses appreciated in the breasts or axillae bilaterally.    ECOG = 0  0 - Asymptomatic (Fully active, able to carry on all predisease  activities without restriction)  1 - Symptomatic but completely ambulatory (Restricted in physically strenuous activity but ambulatory and able to carry out work of a light or sedentary nature. For example, light housework, office work)  2 - Symptomatic, <50% in bed during the day (Ambulatory and capable of all self care but unable to carry out any work activities. Up and about more than 50% of waking hours)  3 - Symptomatic, >50% in bed, but not bedbound (Capable of only limited self-care, confined to bed or chair 50% or more of waking hours)  4 - Bedbound (Completely disabled. Cannot carry on any self-care. Totally confined to bed or chair)  5 - Death   Aurea Blossom MM, Creech RH, Tormey DC, et al. [phone]). "Toxicity and response criteria of the [facility]". Am. Hillard Lowes. Oncol. 5 (6): 649-55   LABORATORY DATA:   CBC    Component Value Date/Time   WBC 7.3 10/22/2023 1209   WBC 5.6 09/17/2022 0830   RBC 5.19 (H) 10/22/2023 1209   HGB 14.4 10/22/2023 1209   HCT 43.2 10/22/2023 1209   PLT 331 10/22/2023 1209   MCV 83.2 10/22/2023 1209   MCH 27.7 10/22/2023 1209   MCHC 33.3 10/22/2023 1209   RDW 14.2 10/22/2023 1209   LYMPHSABS 2.1 10/22/2023 1209   MONOABS 0.5 10/22/2023 1209  EOSABS 0.2 10/22/2023 1209   BASOSABS 0.0 10/22/2023 1209    CMP     Component Value Date/Time   NA 137 10/22/2023 1209   K 3.8 10/22/2023 1209   CL 102 10/22/2023 1209   CO2 29 10/22/2023 1209   GLUCOSE 127 (H) 10/22/2023 1209   BUN 17 10/22/2023 1209   CREATININE 0.78 10/22/2023 1209   CREATININE 0.89 09/05/2020 0857   CALCIUM 9.6 10/22/2023 1209   PROT 7.4 10/22/2023 1209   ALBUMIN 4.4 10/22/2023 1209   AST 20 10/22/2023 1209   ALT 29 10/22/2023 1209   ALKPHOS 78 10/22/2023 1209   BILITOT 0.4 10/22/2023 1209   GFR 85.89 09/17/2022 0830   GFRNONAA >60 10/22/2023 1209      RADIOGRAPHY: US  RT BREAST BX W LOC DEV 1ST LESION IMG BX SPEC US  GUIDE Addendum Date:  10/08/2023 ADDENDUM REPORT: 10/08/2023 13:35 ADDENDUM: Pathology revealed: GRADE III INVASIVE DUCTAL CARCINOMA; OTHER FINDINGS: WITH GEOGRAPHIC NECROSIS of the RIGHT breast, 12 o'clock, 3 cmfn, (coil clip). This was found to be concordant by Dr. Marvene Slipper. Pathology revealed: BENIGN TISSUE WITH REACTIVE LYMPHOID HYPERPLASIA of the RIGHT axilla, (hydromark spiral clip). This was found to be concordant by Dr. Marvene Slipper. Pathology results were discussed with the patient by telephone. The patient reported doing well after the biopsies with tenderness at the sites. Post biopsy instructions and care were reviewed and questions were answered. The patient was encouraged to call [facility] for any additional concerns. My direct phone number was provided. The patient was referred to [facility] on Oct 22, 2023, per patient request. Pathology results reported by Kraig Peru, RN on 10/08/2023. Electronically Signed   By: Rinda Cheers M.D.   On: 10/08/2023 13:35   Result Date: 10/08/2023 CLINICAL DATA:  58 year old with a screening detected highly suspicious 5.0 cm mass in the upper RIGHT breast at 12 o'clock 3 cm from the nipple. Solitary abnormal RIGHT axillary lymph node with focal cortical thickening up to 0.7 cm EXAM: ULTRASOUND-GUIDED RIGHT BREAST CORE NEEDLE BIOPSY ULTRASOUND-GUIDED RIGHT AXILLARY LYMPH NODE CORE NEEDLE BIOPSY COMPARISON:  Previous exam(s). PROCEDURE: I met with the patient and we discussed the procedure of ultrasound-guided biopsy, including benefits and alternatives. We discussed the high likelihood of a successful procedure. We discussed the risks of the procedure, including infection, bleeding, tissue injury, clip migration, and inadequate sampling. Informed written consent was given. The usual time-out protocol was performed immediately prior to the procedure. #1) RIGHT breast mass,  lesion quadrant: Upper breast, 12 o'clock location Using sterile technique with chlorhexidine as skin antisepsis, 1% lidocaine  as local anesthetic, under direct ultrasound visualization, a 12 gauge Bard Marquee core needle device placed through an 11 gauge introducer needle was used to perform biopsy of the mass in the upper breast using a lateral approach. At the conclusion of the procedure, a coil shaped tissue marker clip was deployed into the biopsy cavity. # 2) RIGHT axillary node: Using sterile technique with chlorhexidine as skin antisepsis, 1% lidocaine  and 1% lidocaine  with epinephrine as local anesthetic, under direct ultrasound visualization, a 14 gauge Bard Marquee core needle device placed through a 13 gauge introducer needle was used for biopsy of the solitary abnormal lymph node using an inferolateral approach. At the conclusion of the procedure, a HydroMark spiral shaped tissue marker clip was deployed into the biopsy cavity. The patient tolerated the procedures well without apparent immediate complications. Follow  up 2 view mammogram was performed in order to confirm clip placement and was dictated separately. IMPRESSION: 1. Ultrasound guided core needle biopsy of a highly suspicious 1.0 cm mass in the upper RIGHT breast. 2. Ultrasound guided core needle biopsy of a solitary abnormal RIGHT axillary lymph node. Electronically Signed: By: Rinda Cheers M.D. On: 10/07/2023 14:44   US  AXILLARY NODE CORE BIOPSY RIGHT Addendum Date: 10/08/2023 ADDENDUM REPORT: 10/08/2023 13:35 ADDENDUM: Pathology revealed: GRADE III INVASIVE DUCTAL CARCINOMA; OTHER FINDINGS: WITH GEOGRAPHIC NECROSIS of the RIGHT breast, 12 o'clock, 3 cmfn, (coil clip). This was found to be concordant by Dr. Marvene Slipper. Pathology revealed: BENIGN TISSUE WITH REACTIVE LYMPHOID HYPERPLASIA of the RIGHT axilla, (hydromark spiral clip). This was found to be concordant by Dr. Marvene Slipper. Pathology results were discussed with the  patient by telephone. The patient reported doing well after the biopsies with tenderness at the sites. Post biopsy instructions and care were reviewed and questions were answered. The patient was encouraged to call [facility] for any additional concerns. My direct phone number was provided. The patient was referred to [facility] at [facility] on Oct 22, 2023, per patient request. Pathology results reported by Kraig Peru, RN on 10/08/2023. Electronically Signed   By: Rinda Cheers M.D.   On: 10/08/2023 13:35   Result Date: 10/08/2023 CLINICAL DATA:  58 year old with a screening detected highly suspicious 5.0 cm mass in the upper RIGHT breast at 12 o'clock 3 cm from the nipple. Solitary abnormal RIGHT axillary lymph node with focal cortical thickening up to 0.7 cm EXAM: ULTRASOUND-GUIDED RIGHT BREAST CORE NEEDLE BIOPSY ULTRASOUND-GUIDED RIGHT AXILLARY LYMPH NODE CORE NEEDLE BIOPSY COMPARISON:  Previous exam(s). PROCEDURE: I met with the patient and we discussed the procedure of ultrasound-guided biopsy, including benefits and alternatives. We discussed the high likelihood of a successful procedure. We discussed the risks of the procedure, including infection, bleeding, tissue injury, clip migration, and inadequate sampling. Informed written consent was given. The usual time-out protocol was performed immediately prior to the procedure. #1) RIGHT breast mass, lesion quadrant: Upper breast, 12 o'clock location Using sterile technique with chlorhexidine as skin antisepsis, 1% lidocaine  as local anesthetic, under direct ultrasound visualization, a 12 gauge Bard Marquee core needle device placed through an 11 gauge introducer needle was used to perform biopsy of the mass in the upper breast using a lateral approach. At the conclusion of the procedure, a coil shaped tissue marker clip was deployed into the biopsy cavity. # 2)  RIGHT axillary node: Using sterile technique with chlorhexidine as skin antisepsis, 1% lidocaine  and 1% lidocaine  with epinephrine as local anesthetic, under direct ultrasound visualization, a 14 gauge Bard Marquee core needle device placed through a 13 gauge introducer needle was used for biopsy of the solitary abnormal lymph node using an inferolateral approach. At the conclusion of the procedure, a HydroMark spiral shaped tissue marker clip was deployed into the biopsy cavity. The patient tolerated the procedures well without apparent immediate complications. Follow up 2 view mammogram was performed in order to confirm clip placement and was dictated separately. IMPRESSION: 1. Ultrasound guided core needle biopsy of a highly suspicious 1.0 cm mass in the upper RIGHT breast. 2. Ultrasound guided core needle biopsy of a solitary abnormal RIGHT axillary lymph node. Electronically Signed: By: Rinda Cheers M.D. On: 10/07/2023 14:44   MM CLIP PLACEMENT RIGHT Result Date: 10/07/2023 CLINICAL DATA:  Confirmation of clip placement after ultrasound-guided  core needle biopsy of a 5.0 cm mass in the upper RIGHT breast at 12 o'clock and ultrasound-guided core needle biopsy of a solitary abnormal RIGHT axillary lymph node. EXAM: 2D and 3D DIAGNOSTIC RIGHT MAMMOGRAM POST ULTRASOUND BIOPSY COMPARISON:  Previous exam(s). ACR Breast Density Category b: There are scattered areas of fibroglandular density. FINDINGS: 2D and 3D full field CC and mediolateral mammographic images were obtained following ultrasound guided biopsy of a mass in the upper RIGHT breast and an abnormal RIGHT axillary lymph node. The coil shaped tissue marking clip is appropriately positioned within the biopsied mass in the upper breast. The HydroMark spiral shaped tissue marking clip placed into the biopsied lymph node is not visible on the mammogram as this node is located deeper in the axilla (this lymph node was not visible on the screening  mammogram). Expected post biopsy changes are present without evidence of hematoma. IMPRESSION: 1. Appropriate positioning of the coil shaped tissue marking clip within the biopsied mass in the upper RIGHT breast. 2. The HydroMark spiral shaped tissue marking clip placed in the biopsied RIGHT axillary lymph node is not visible on the mammogram due to the deeper location of the lymph node. Final Assessment: Post Procedure Mammograms for Marker Placement Electronically Signed   By: Rinda Cheers M.D.   On: 10/07/2023 14:43   MM 3D DIAGNOSTIC MAMMOGRAM UNILATERAL RIGHT BREAST Result Date: 10/06/2023 CLINICAL DATA:  Patient was recalled from screening mammogram for a possible right breast mass. EXAM: DIGITAL DIAGNOSTIC UNILATERAL RIGHT MAMMOGRAM WITH TOMOSYNTHESIS AND CAD; ULTRASOUND RIGHT BREAST LIMITED TECHNIQUE: Right digital diagnostic mammography and breast tomosynthesis was performed. The images were evaluated with computer-aided detection. ; Targeted ultrasound examination of the right breast was performed COMPARISON:  Previous exam(s). ACR Breast Density Category b: There are scattered areas of fibroglandular density. FINDINGS: Additional imaging of the right breast was performed. There is persistent of an irregular mass with circumscribed and spiculated margins in the upper aspect of the breast. There are no malignant type microcalcifications. On physical exam, I palpate a discrete mass in the 12 o'clock region of the right breast 3 cm from the nipple. Targeted ultrasound is performed, showing a hypoechoic mass with lobulated and angular margins in the 12 o'clock region of the right breast 3 cm from the nipple measuring 4.8 x 3.3 x 5.1 cm. Sonographic evaluation of the right axilla shows 1 abnormal lymph node with focal cortical thickening measuring 7 mm. IMPRESSION: Suspicious 5.1 cm mass in the 12 o'clock region of the right breast. Suspicious right axillary lymph node. RECOMMENDATION: Ultrasound-guided  core biopsies of the mass in the 12 o'clock region of the right breast as well as the suspicious right axillary lymph node is recommended. I have discussed the findings and recommendations with the patient. If applicable, a reminder letter will be sent to the patient regarding the next appointment. BI-RADS CATEGORY  5: Highly suggestive of malignancy. Electronically Signed   By: Dina  Arceo M.D.   On: 10/06/2023 16:06   US  LIMITED ULTRASOUND INCLUDING AXILLA RIGHT BREAST Result Date: 10/06/2023 CLINICAL DATA:  Patient was recalled from screening mammogram for a possible right breast mass. EXAM: DIGITAL DIAGNOSTIC UNILATERAL RIGHT MAMMOGRAM WITH TOMOSYNTHESIS AND CAD; ULTRASOUND RIGHT BREAST LIMITED TECHNIQUE: Right digital diagnostic mammography and breast tomosynthesis was performed. The images were evaluated with computer-aided detection. ; Targeted ultrasound examination of the right breast was performed COMPARISON:  Previous exam(s). ACR Breast Density Category b: There are scattered areas of fibroglandular density. FINDINGS: Additional imaging of  the right breast was performed. There is persistent of an irregular mass with circumscribed and spiculated margins in the upper aspect of the breast. There are no malignant type microcalcifications. On physical exam, I palpate a discrete mass in the 12 o'clock region of the right breast 3 cm from the nipple. Targeted ultrasound is performed, showing a hypoechoic mass with lobulated and angular margins in the 12 o'clock region of the right breast 3 cm from the nipple measuring 4.8 x 3.3 x 5.1 cm. Sonographic evaluation of the right axilla shows 1 abnormal lymph node with focal cortical thickening measuring 7 mm. IMPRESSION: Suspicious 5.1 cm mass in the 12 o'clock region of the right breast. Suspicious right axillary lymph node. RECOMMENDATION: Ultrasound-guided core biopsies of the mass in the 12 o'clock region of the right breast as well as the suspicious right  axillary lymph node is recommended. I have discussed the findings and recommendations with the patient. If applicable, a reminder letter will be sent to the patient regarding the next appointment. BI-RADS CATEGORY  5: Highly suggestive of malignancy. Electronically Signed   By: Dina  Arceo M.D.   On: 10/06/2023 16:06      IMPRESSION/PLAN:  Cancer Staging  Malignant neoplasm of upper-outer quadrant of right breast in female, estrogen receptor positive (HCC) Staging form: Breast, AJCC 8th Edition - Clinical stage from 10/22/2023: Stage IIIA (cT3, cN0, cM0, G3, ER+, PR-, HER2-) - Signed by Cameron Cea, MD on 10/22/2023 Stage prefix: Initial diagnosis Histologic grading system: 3 grade system Laterality: Right Staged by: Pathologist and managing physician Stage used in treatment planning: Yes National guidelines used in treatment planning: Yes Type of national guideline used in treatment planning: NCCN     It was a pleasure meeting the patient today.  Based on tumor board recommendations, she will receive neoadjuvant chemotherapy followed by surgery.  The hope is that breast conserving surgery will be realistic after neoadjuvant chemotherapy.  Today, we discussed the risks, benefits, and side effects of adjuvant radiotherapy. I recommend adjuvant radiotherapy to the right breast to reduce her risk of locoregional recurrence by 2/3.  We discussed that radiation would take approximately 4 weeks to complete and that I would give the patient a few weeks to heal following surgery before starting treatment planning.  If she undergoes a mastectomy radiation therapy would take about 6 weeks to complete.  We discussed the possibility of treating regional lymph nodes as needed if they are pathologically positive.    We spoke about acute effects including skin irritation and fatigue as well as much less common late effects including internal organ injury or irritation. We spoke about the latest technology that  is used to minimize the risk of late effects for patients undergoing radiotherapy to the breast or chest wall. No guarantees of treatment were given. The patient is enthusiastic about proceeding with treatment. I look forward to participating in the patient's care.  I will await her referral back to me for postoperative follow-up and eventual CT simulation/treatment planning.  On date of service, in total, I spent 60 minutes on this encounter. Patient was seen in person.   __________________________________________   Colie Dawes, MD  This document serves as a record of services personally performed by Colie Dawes, MD. It was created on her behalf by Aleta Anda, a trained medical scribe. The creation of this record is based on the scribe's personal observations and the provider's statements to them. This document has been checked and approved by the attending provider.

## 2023-10-22 ENCOUNTER — Ambulatory Visit
Admission: RE | Admit: 2023-10-22 | Discharge: 2023-10-22 | Disposition: A | Discharge status: Home / Self Care | Source: Ambulatory Visit | Attending: Radiation Oncology | Admitting: Radiation Oncology

## 2023-10-22 ENCOUNTER — Other Ambulatory Visit (HOSPITAL_COMMUNITY): Payer: Self-pay

## 2023-10-22 ENCOUNTER — Encounter: Payer: Self-pay | Admitting: Physical Therapy

## 2023-10-22 ENCOUNTER — Other Ambulatory Visit: Payer: Self-pay

## 2023-10-22 ENCOUNTER — Inpatient Hospital Stay

## 2023-10-22 ENCOUNTER — Ambulatory Visit: Payer: Self-pay | Admitting: General Surgery

## 2023-10-22 ENCOUNTER — Inpatient Hospital Stay: Attending: Hematology and Oncology | Admitting: Hematology and Oncology

## 2023-10-22 ENCOUNTER — Ambulatory Visit: Attending: General Surgery | Admitting: Physical Therapy

## 2023-10-22 ENCOUNTER — Inpatient Hospital Stay (HOSPITAL_BASED_OUTPATIENT_CLINIC_OR_DEPARTMENT_OTHER): Admitting: Genetic Counselor

## 2023-10-22 ENCOUNTER — Encounter: Payer: Self-pay | Admitting: Hematology and Oncology

## 2023-10-22 ENCOUNTER — Encounter: Payer: Self-pay | Admitting: General Practice

## 2023-10-22 ENCOUNTER — Encounter: Payer: Self-pay | Admitting: *Deleted

## 2023-10-22 VITALS — BP 134/84 | HR 91 | Temp 99.1°F | Resp 17 | Ht 60.0 in | Wt 184.4 lb

## 2023-10-22 DIAGNOSIS — Z170 Estrogen receptor positive status [ER+]: Secondary | ICD-10-CM | POA: Diagnosis not present

## 2023-10-22 DIAGNOSIS — C50411 Malignant neoplasm of upper-outer quadrant of right female breast: Secondary | ICD-10-CM

## 2023-10-22 DIAGNOSIS — C50111 Malignant neoplasm of central portion of right female breast: Secondary | ICD-10-CM | POA: Diagnosis not present

## 2023-10-22 DIAGNOSIS — Z860100 Personal history of colon polyps, unspecified: Secondary | ICD-10-CM

## 2023-10-22 DIAGNOSIS — Z1732 Human epidermal growth factor receptor 2 negative status: Secondary | ICD-10-CM | POA: Diagnosis not present

## 2023-10-22 DIAGNOSIS — Z1721 Progesterone receptor positive status: Secondary | ICD-10-CM | POA: Diagnosis not present

## 2023-10-22 DIAGNOSIS — R293 Abnormal posture: Secondary | ICD-10-CM | POA: Diagnosis not present

## 2023-10-22 DIAGNOSIS — Z79899 Other long term (current) drug therapy: Secondary | ICD-10-CM | POA: Diagnosis not present

## 2023-10-22 DIAGNOSIS — Z83719 Family history of colon polyps, unspecified: Secondary | ICD-10-CM

## 2023-10-22 DIAGNOSIS — Z171 Estrogen receptor negative status [ER-]: Secondary | ICD-10-CM | POA: Diagnosis not present

## 2023-10-22 LAB — CMP (CANCER CENTER ONLY)
ALT: 29 U/L (ref 0–44)
AST: 20 U/L (ref 15–41)
Albumin: 4.4 g/dL (ref 3.5–5.0)
Alkaline Phosphatase: 78 U/L (ref 38–126)
Anion gap: 6 (ref 5–15)
BUN: 17 mg/dL (ref 6–20)
CO2: 29 mmol/L (ref 22–32)
Calcium: 9.6 mg/dL (ref 8.9–10.3)
Chloride: 102 mmol/L (ref 98–111)
Creatinine: 0.78 mg/dL (ref 0.44–1.00)
GFR, Estimated: >60 mL/min (ref >60)
Glucose, Bld: 127 mg/dL — ABNORMAL HIGH (ref 70–99)
Potassium: 3.8 mmol/L (ref 3.5–5.1)
Sodium: 137 mmol/L (ref 135–145)
Total Bilirubin: 0.4 mg/dL (ref 0.0–1.2)
Total Protein: 7.4 g/dL (ref 6.5–8.1)

## 2023-10-22 LAB — CBC WITH DIFFERENTIAL (CANCER CENTER ONLY)
Abs Immature Granulocytes: 0.03 10*3/uL (ref 0.00–0.07)
Basophils Absolute: 0 10*3/uL (ref 0.0–0.1)
Basophils Relative: 0 %
Eosinophils Absolute: 0.2 10*3/uL (ref 0.0–0.5)
Eosinophils Relative: 2 %
HCT: 43.2 % (ref 36.0–46.0)
Hemoglobin: 14.4 g/dL (ref 12.0–15.0)
Immature Granulocytes: 0 %
Lymphocytes Relative: 28 %
Lymphs Abs: 2.1 10*3/uL (ref 0.7–4.0)
MCH: 27.7 pg (ref 26.0–34.0)
MCHC: 33.3 g/dL (ref 30.0–36.0)
MCV: 83.2 fL (ref 80.0–100.0)
Monocytes Absolute: 0.5 10*3/uL (ref 0.1–1.0)
Monocytes Relative: 7 %
Neutro Abs: 4.5 10*3/uL (ref 1.7–7.7)
Neutrophils Relative %: 63 %
Platelet Count: 331 10*3/uL (ref 150–400)
RBC: 5.19 MIL/uL — ABNORMAL HIGH (ref 3.87–5.11)
RDW: 14.2 % (ref 11.5–15.5)
WBC Count: 7.3 10*3/uL (ref 4.0–10.5)
nRBC: 0 % (ref 0.0–0.2)

## 2023-10-22 LAB — GENETIC SCREENING ORDER

## 2023-10-22 MED ORDER — DEXAMETHASONE 4 MG PO TABS
ORAL_TABLET | ORAL | 0 refills | Status: DC
  Filled 2023-10-22: qty 8, 8d supply, fill #0

## 2023-10-22 MED ORDER — ONDANSETRON HCL 8 MG PO TABS
8.0000 mg | ORAL_TABLET | Freq: Three times a day (TID) | ORAL | 1 refills | Status: DC | PRN
  Filled 2023-10-22: qty 30, 10d supply, fill #0

## 2023-10-22 MED ORDER — LIDOCAINE-PRILOCAINE 2.5-2.5 % EX CREA
TOPICAL_CREAM | CUTANEOUS | 3 refills | Status: DC
  Filled 2023-10-22: qty 30, 30d supply, fill #0
  Filled 2023-11-15: qty 30, 30d supply, fill #1
  Filled 2023-12-15: qty 30, 30d supply, fill #2

## 2023-10-22 MED ORDER — PROCHLORPERAZINE MALEATE 10 MG PO TABS
10.0000 mg | ORAL_TABLET | Freq: Four times a day (QID) | ORAL | 1 refills | Status: DC | PRN
  Filled 2023-10-22: qty 30, 8d supply, fill #0

## 2023-10-22 NOTE — Progress Notes (Signed)
 REFERRING PROVIDER: Cameron Cea, MD [address] [place],  [place] [address]  PRIMARY PROVIDER:  Laine Piggs, NP  PRIMARY REASON FOR VISIT:  1. Malignant neoplasm of upper-outer quadrant of right breast in female, estrogen receptor positive (HCC)   2. History of colonic polyps   3. Family history of colonic polyps     HISTORY OF PRESENT ILLNESS:   Ms. Doepke, a 58 y.o. female, was seen for a [place] cancer genetics consultation at the request of Dr. Lee Public due to a personal history of breast cancer.  Ms. Bodkins presents to clinic today to discuss the possibility of a hereditary predisposition to cancer, to discuss genetic testing, and to further clarify her future cancer risks, as well as potential cancer risks for family members.   In May 2025, at the age of 13, Ms. Swindle was diagnosed with functionally triple negative invasive ductal carcinoma of the right breast. The treatment plan includes neoadjuvant chemotherapy.  She also has a history of squamous cell carcinomas skin cancer diagnosed ~5 years ago.    CANCER HISTORY:  Oncology History  Malignant neoplasm of upper-outer quadrant of right breast in female, estrogen receptor positive (HCC)  10/07/2023 Initial Diagnosis   Screening mammogram detected right breast mass at 12:00 measuring 5.1 cm, 1 axillary lymph node biopsy was benign biopsy of the mass grade 3 IDC with necrosis ER 30% weak, PR 0%, Ki67 60%, HER2 negative   10/22/2023 Cancer Staging   Staging form: Breast, AJCC 8th Edition - Clinical stage from 10/22/2023: Stage IIIA (cT3, cN0, cM0, G3, ER+, PR-, HER2-) - Signed by Cameron Cea, MD on 10/22/2023 Stage prefix: Initial diagnosis Histologic grading system: 3 grade system Laterality: Right Staged by: Pathologist and managing physician Stage used in treatment planning: Yes National guidelines used in treatment planning: Yes Type of national guideline used in treatment planning: NCCN    11/05/2023 -  Chemotherapy   Patient is on Treatment Plan : BREAST Pembrolizumab (200) D1 + Carboplatin (1.5) D1,8,15 + Paclitaxel (80) D1,8,15 q21d X 4 cycles / Pembrolizumab (200) D1 + AC D1 q21d x 4 cycles          Past Medical History:  Diagnosis Date   Arthritis    Blood transfusion without reported diagnosis 2002   Carpal tunnel syndrome, bilateral    Elevated hemoglobin (HCC)    Hypertension    Incompetent cervix    DELIVERED AT 31 WEEKS   Migraines    OSA (obstructive sleep apnea)    Rx for autopap, does not use   Ruptured ectopic pregnancy 2002   WITH BLOOD TRANSFUSION 2U'S PRBC'S   Wears glasses     Past Surgical History:  Procedure Laterality Date   BREAST BIOPSY Right 10/07/2023   US  RT BREAST BX W LOC DEV 1ST LESION IMG BX SPEC US  GUIDE 10/07/2023 [facility] MAMMOGRAPHY   CERCLAGE PLACEMENT     DILATATION & CURETTAGE/HYSTEROSCOPY WITH MYOSURE N/A 04/19/2022   Procedure: DILATATION & CURETTAGE/HYSTEROSCOPY WITH MYOSURE;  Surgeon: Lavoie, Marie-Lyne, MD;  Location: [facility];  Service: Gynecology;  Laterality: N/A;   RESECTOSCOPIC POLYPECTOMY  2001   SALPINGECTOMY Right    RIGHT, RUPTURED ECTOPIC   SKIN CANCER EXCISION  2020   tangential bx  02/22/2019    FAMILY HISTORY:  We obtained a detailed, 4-generation family history.  Significant diagnoses are listed below: Family History  Problem Relation Age of Onset   Colon polyps Mother    Colon polyps Sister  Colon polyps Brother      Ms. Ranker is unaware of previous family history of genetic testing for hereditary cancer risks.   There is no reported Ashkenazi Jewish ancestry. There is no known consanguinity.  GENETIC COUNSELING ASSESSMENT: Ms. Lovan is a 58 y.o. female with a personal history of breast cancer which is somewhat suggestive of a hereditary cancer syndrome and predisposition to cancer given the functional triple negative status and her age of diagnosis. We, therefore, discussed  and recommended the following at today's visit.   DISCUSSION: We discussed that 5 - 10% of cancer is hereditary.  Most cases of hereditary breast cancer are associated with mutations in BRCA1/2.  There are other genes that can be associated with hereditary breast cancer syndromes.  We discussed that testing is beneficial for several reasons including knowing how to follow individuals for their cancer risks, identifying whether potential treatment/surgery options (PARP inhibitors, bilateral mastectomy) would be beneficial, and understanding if other family members could be at an increased risk for cancer and allowing them to undergo genetic testing.   We reviewed the characteristics, features and inheritance patterns of hereditary cancer syndromes. We also discussed genetic testing, including the appropriate family members to test, the process of testing, insurance coverage and turn-around-time for results. We discussed the implications of a negative, positive, carrier and/or variant of uncertain significant result. We recommended Ms. Brum pursue genetic testing for a panel that includes genes associated with breast and other cancers.   Ms. Gwinn  was offered a common hereditary cancer panel (~40 genes) and an expanded pan-cancer panel (~70 genes). Ms. Khurana was informed of the benefits and limitations of each panel, including that expanded pan-cancer panels contain genes that do not have clear management guidelines at this point in time.  We also discussed that as the number of genes included on a panel increases, the chances of variants of uncertain significance increases.  After considering the benefits and limitations of each gene panel, Ms. Ortloff  elected to have a common hereditary cancers panel through [place].  The [facility] CancerNext+RNAinsight Panel includes sequencing, rearrangement analysis, and RNA analysis for the following 40 genes: APC, ATM, BAP1, BARD1, BMPR1A, BRCA1, BRCA2, BRIP1, CDH1,  CDKN2A, CHEK2, FH, FLCN, MET, MLH1, MSH2, MSH6, MUTYH, NF1, NTHL1, PALB2, PMS2, PTEN, RSP20, RAD51C, RAD51D, SMAD4, STK11, TP53, TSC1, TSC2, and VHL (sequencing and deletion/duplication); AXIN2, HOXB13, MBD4, MSH3, POLD1 and POLE (sequencing only); EPCAM and GREM1 (deletion/duplication only).  Based on Ms. Sebree's personal history of functionally triple negative breast cancer, she meets NCCN medical criteria for genetic testing. She is the most informative relative available for genetic testing. We discussed that if her out of pocket cost for testing is over $100, the laboratory should contact her and discuss the self-pay prices and/or patient pay assistance programs.    PLAN: After considering the risks, benefits, and limitations, Ms. Pandey provided informed consent to pursue genetic testing and the blood sample was sent to [facility] for analysis of the CancerNext+RNAinsight Panel. Results should be available within approximately 3 weeks, at which point they will be disclosed by telephone to Ms. Edgett, as will any additional recommendations warranted by these results. Ms. Wooldridge will receive a summary of her genetic counseling visit and a copy of her results once available. This information will also be available in Epic.   Ms. Linck's questions were answered to her satisfaction today. Our contact information was provided should additional questions or concerns arise. Thank you for the referral and allowing  us  to share in the care of your patient.   Natoshia Souter M. Ora Billing, MS, [facility] Genetic Counselor [email or web address] (P) [phone]   25 minutes were spent on the date of the encounter in service to the patient including preparation, face-to-face consultation, documentation and care coordination.  The patient was accompanied by her husband.  Dr. Gudena was available to discuss this case as needed.    _______________________________________________________________________ For  Office Staff:  Number of people involved in session: 2 Was an Intern/ student involved with case: no

## 2023-10-22 NOTE — Progress Notes (Signed)
 [facility] CONSULT NOTE  Patient Care Team: Laine Piggs, NP as PCP - General (Obstetrics and Gynecology) Lavoie, Marie-Lyne, MD as Consulting Physician (Obstetrics and Gynecology) Cameron Cea, MD as Consulting Physician (Hematology and Oncology) Auther Bo, RN as Oncology Nurse Navigator Alane Hsu, RN as Oncology Nurse Navigator Caralyn Chandler, MD as Consulting Physician (General Surgery) Colie Dawes, MD as Attending Physician (Radiation Oncology)  CHIEF COMPLAINTS/PURPOSE OF CONSULTATION:  Newly diagnosed breast cancer  HISTORY OF PRESENTING ILLNESS:  Ms. Gabrielle Cross is a 57 year old with recently diagnosed breast cancer.  She had a routine screening mammogram that detected a mass in the right breast at 12 o'clock position measuring 5.1 cm.  There was enlarged axillary lymph node which on biopsy was benign.  Pathology revealed that the mass was a grade 2 invasive ductal carcinoma that was functional triple negative breast cancer with a Ki-67 of 60%.  She will present this morning to the multidisciplinary tumor board and she is here today accompanied by her husband to discuss treatment plan.   I reviewed her records extensively and collaborated the history with the patient.  SUMMARY OF ONCOLOGIC HISTORY: Oncology History  Malignant neoplasm of upper-outer quadrant of right breast in female, estrogen receptor positive (HCC)  10/07/2023 Initial Diagnosis   Screening mammogram detected right breast mass at 12:00 measuring 5.1 cm, 1 axillary lymph node biopsy was benign biopsy of the mass grade 3 IDC with necrosis ER 30% weak, PR 0%, Ki67 60%, HER2 negative   10/22/2023 Cancer Staging   Staging form: Breast, AJCC 8th Edition - Clinical stage from 10/22/2023: Stage IIIA (cT3, cN0, cM0, G3, ER+, PR-, HER2-) - Signed by Cameron Cea, MD on 10/22/2023 Stage prefix: Initial diagnosis Histologic grading system: 3 grade system Laterality: Right Staged by:  Pathologist and managing physician Stage used in treatment planning: Yes National guidelines used in treatment planning: Yes Type of national guideline used in treatment planning: NCCN      MEDICAL HISTORY:  Past Medical History:  Diagnosis Date   Arthritis    Blood transfusion without reported diagnosis 2002   Carpal tunnel syndrome, bilateral    Elevated hemoglobin (HCC)    Hypertension    Incompetent cervix    DELIVERED AT 31 WEEKS   Migraines    OSA (obstructive sleep apnea)    Rx for autopap, does not use   Ruptured ectopic pregnancy 2002   WITH BLOOD TRANSFUSION 2U'S PRBC'S   Wears glasses     SURGICAL HISTORY: Past Surgical History:  Procedure Laterality Date   BREAST BIOPSY Right 10/07/2023   US  RT BREAST BX W LOC DEV 1ST LESION IMG BX SPEC US  GUIDE 10/07/2023 [facility] MAMMOGRAPHY   CERCLAGE PLACEMENT     DILATATION & CURETTAGE/HYSTEROSCOPY WITH MYOSURE N/A 04/19/2022   Procedure: DILATATION & CURETTAGE/HYSTEROSCOPY WITH MYOSURE;  Surgeon: Lavoie, Marie-Lyne, MD;  Location: [facility];  Service: Gynecology;  Laterality: N/A;   RESECTOSCOPIC POLYPECTOMY  2001   SALPINGECTOMY Right    RIGHT, RUPTURED ECTOPIC   SKIN CANCER EXCISION  2020   tangential bx  02/22/2019    SOCIAL HISTORY: Social History   Socioeconomic History   Marital status: Married    Spouse name: Not on file   Number of children: Not on file   Years of education: Not on file   Highest education level: Not on file  Occupational History   Not on file  Tobacco Use   Smoking status: Never    Passive  exposure: Never   Smokeless tobacco: Never  Vaping Use   Vaping status: Never Used  Substance and Sexual Activity   Alcohol use: Yes    Comment: weekends - social    Drug use: No   Sexual activity: Yes    Partners: Male    Birth control/protection: Post-menopausal    Comment: intercoure age 36, less than 5 sexual partners, des neg  Other Topics Concern   Not on file  Social  History Narrative   Not on file   Social Drivers of Health   Financial Resource Strain: Not on file  Food Insecurity: Not on file  Transportation Needs: Not on file  Physical Activity: Not on file  Stress: Not on file  Social Connections: Not on file  Intimate Partner Violence: Not on file    FAMILY HISTORY: Family History  Problem Relation Age of Onset   Colon polyps Mother    Hyperlipidemia Father    Colon polyps Sister    Colon polyps Brother    Colon cancer Neg Hx    Esophageal cancer Neg Hx    Rectal cancer Neg Hx    Stomach cancer Neg Hx    Sleep apnea Neg Hx     ALLERGIES:  is allergic to shellfish allergy.  MEDICATIONS:  Current Outpatient Medications  Medication Sig Dispense Refill   dexamethasone  (DECADRON ) 4 MG tablet With Adriamycin Cytoxan take 1 tablet day after chemo and 1 tablet 2 days after chemo with food 8 tablet 0   hydrochlorothiazide  (HYDRODIURIL ) 12.5 MG tablet Take 1 tablet (12.5 mg total) by mouth daily. 90 tablet 4   imipramine  (TOFRANIL ) 25 MG tablet Take 3 tablets (75 mg total) by mouth daily. 90 tablet 5   lidocaine -prilocaine (EMLA) cream Apply to affected area once 30 g 3   ondansetron  (ZOFRAN ) 8 MG tablet Take 1 tablet (8 mg total) by mouth every 8 (eight) hours as needed for nausea or vomiting. Start on the third day after chemotherapy. 30 tablet 1   prochlorperazine (COMPAZINE) 10 MG tablet Take 1 tablet (10 mg total) by mouth every 6 (six) hours as needed for nausea or vomiting. 30 tablet 1   rizatriptan  (MAXALT ) 10 MG tablet Take 1 tablet (10 mg total) by mouth as needed. may repeat once after 2 hours 4 tablet 5   No current facility-administered medications for this visit.    REVIEW OF SYSTEMS:   Constitutional: Denies fevers, chills or abnormal night sweats Breast:  Denies any palpable lumps or discharge All other systems were reviewed with the patient and are negative.  PHYSICAL EXAMINATION: ECOG PERFORMANCE STATUS: 1 -  Symptomatic but completely ambulatory  Vitals:   10/22/23 1311  BP: 134/84  Pulse: 91  Resp: 17  Temp: 99.1 F (37.3 C)  SpO2: 97%   Filed Weights   10/22/23 1311  Weight: 184 lb 6.4 oz (83.6 kg)    GENERAL:alert, no distress and comfortable    LABORATORY DATA:  I have reviewed the data as listed Lab Results  Component Value Date   WBC 7.3 10/22/2023   HGB 14.4 10/22/2023   HCT 43.2 10/22/2023   MCV 83.2 10/22/2023   PLT 331 10/22/2023   Lab Results  Component Value Date   NA 137 10/22/2023   K 3.8 10/22/2023   CL 102 10/22/2023   CO2 29 10/22/2023    RADIOGRAPHIC STUDIES: I have personally reviewed the radiological reports and agreed with the findings in the report.  ASSESSMENT AND PLAN:  Malignant neoplasm of upper-outer quadrant of right breast in female, estrogen receptor positive (HCC) 10/07/2023:Screening mammogram detected right breast mass at 12:00 measuring 5.1 cm, 1 axillary lymph node biopsy was benign biopsy of the mass grade 3 IDC with necrosis ER 30% weak, PR 0%, Ki67 60%, HER2 negative  Pathology and radiology counseling: Discussed with the patient, the details of pathology including the type of breast cancer,the clinical staging, the significance of ER, PR and HER-2/neu receptors and the implications for treatment. After reviewing the pathology in detail, we proceeded to discuss the different treatment options between surgery, radiation, chemotherapy, antiestrogen therapies.  Treatment plan: Neoadjuvant chemotherapy with Taxol carbo Keytruda followed by Adriamycin Cytoxan Keytruda Breast conserving surgery with sentinel lymph node biopsy Adjuvant radiation therapy Antiestrogen therapy with the final path is estrogen receptor positive  Chemotherapy Counseling: I discussed the risks and benefits of chemotherapy including the risks of nausea/ vomiting, risk of infection from low WBC count, fatigue due to chemo or anemia, bruising or bleeding due to low  platelets, mouth sores, loss/ change in taste and decreased appetite. Liver and kidney function will be monitored through out chemotherapy as abnormalities in liver and kidney function may be a side effect of treatment.  Immunotherapy related adverse effects were discussed in detail and neuropathy risk from Taxol as well. Risk of permanent bone marrow dysfunction and leukemia due to chemo were also discussed.  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  She plans to work throughout her treatment.     All questions were answered. The patient knows to call the clinic with any problems, questions or concerns.    Viinay K Emory Leaver, MD 10/22/23

## 2023-10-22 NOTE — Progress Notes (Signed)
START ON PATHWAY REGIMEN - Breast     Cycles 1 through 4: A cycle is every 21 days:     Pembrolizumab      Paclitaxel      Carboplatin      Filgrastim-xxxx    Cycles 5 through 8: A cycle is every 21 days:     Pembrolizumab      Doxorubicin      Cyclophosphamide      Pegfilgrastim-xxxx   **Always confirm dose/schedule in your pharmacy ordering system**  Patient Characteristics: Preoperative or Nonsurgical Candidate, M0 (Clinical Staging), Up to cT4c, Any N, M0, Neoadjuvant Therapy followed by Surgery, Invasive Disease, Chemotherapy, HER2 Negative, ER Negative, Platinum Therapy Indicated and Candidate for Checkpoint Inhibitor Therapeutic Status: Preoperative or Nonsurgical Candidate, M0 (Clinical Staging) AJCC M Category: cM0 AJCC Grade: G3 ER Status: Negative (-) AJCC 8 Stage Grouping: IIIB HER2 Status: Negative (-) AJCC T Category: cT3 AJCC N Category: cN0 PR Status: Negative (-) Breast Surgical Plan: Neoadjuvant Therapy followed by Surgery Intent of Therapy: Curative Intent, Discussed with Patient

## 2023-10-22 NOTE — Progress Notes (Signed)
 [facility] Multidisciplinary Clinic Spiritual Care Note  Met with Marika in Breast Multidisciplinary Clinic to introduce Support Center team/resources.  She completed SDOH screening; results follow below.    SDOH Screenings   Food Insecurity: No Food Insecurity (10/22/2023)  Housing: Low Risk  (10/22/2023)  Transportation Needs: No Transportation Needs (10/22/2023)  Utilities: Not At Risk (10/22/2023)  Depression (PHQ2-9): Low Risk  (05/08/2023)  Tobacco Use: Low Risk  (10/22/2023)    Chaplain and patient discussed common feelings and emotions when being diagnosed with cancer, and the importance of support during treatment.  Chaplain informed patient of the support team and support services at [facility].  Chaplain provided contact information and encouraged patient to call with any questions or concerns.  Follow up needed: Yes.  We plan to follow up by phone in ca two weeks for Spiritual Care check-in and to revisit interest in [employer] peer mentor.   [address] [name], [place], [facility] Pager [phone] Voicemail [phone]

## 2023-10-22 NOTE — Therapy (Signed)
 OUTPATIENT PHYSICAL THERAPY BREAST CANCER BASELINE EVALUATION   Patient Name: Gabrielle Cross MRN: [id number] DOB:09-Apr-1966, 58 y.o., female Today's Date: 10/22/2023  END OF SESSION:  PT End of Session - 10/22/23 1611     Visit Number 1    Number of Visits 2    Date for PT Re-Evaluation 04/23/24    PT Start Time 1453    PT Stop Time 1517    PT Time Calculation (min) 24 min    Activity Tolerance Patient tolerated treatment well    Behavior During Therapy [facility] for tasks assessed/performed             Past Medical History:  Diagnosis Date   Arthritis    Blood transfusion without reported diagnosis 2002   Carpal tunnel syndrome, bilateral    Elevated hemoglobin (HCC)    Hypertension    Incompetent cervix    DELIVERED AT 31 WEEKS   Migraines    OSA (obstructive sleep apnea)    Rx for autopap, does not use   Ruptured ectopic pregnancy 2002   WITH BLOOD TRANSFUSION 2U'S PRBC'S   Wears glasses    Past Surgical History:  Procedure Laterality Date   BREAST BIOPSY Right 10/07/2023   US  RT BREAST BX W LOC DEV 1ST LESION IMG BX SPEC US  GUIDE 10/07/2023 [facility] MAMMOGRAPHY   CERCLAGE PLACEMENT     DILATATION & CURETTAGE/HYSTEROSCOPY WITH MYOSURE N/A 04/19/2022   Procedure: DILATATION & CURETTAGE/HYSTEROSCOPY WITH MYOSURE;  Surgeon: Lavoie, Marie-Lyne, MD;  Location: [facility];  Service: Gynecology;  Laterality: N/A;   RESECTOSCOPIC POLYPECTOMY  2001   SALPINGECTOMY Right    RIGHT, RUPTURED ECTOPIC   SKIN CANCER EXCISION  2020   tangential bx  02/22/2019   Patient Active Problem List   Diagnosis Date Noted   Malignant neoplasm of upper-outer quadrant of right breast in female, estrogen receptor positive (HCC) 10/20/2023   Need for hepatitis C screening test 09/17/2022   Dyslipidemia, goal LDL below 130 09/17/2022   Acne rosacea 08/23/2022   Inflamed seborrheic keratosis 08/23/2022   Keratosis pilaris 08/23/2022   Vitamin D  deficiency 04/19/2021   Loud  snoring 04/18/2021   Primary hypertension 04/18/2021   Encounter for general adult medical examination with abnormal findings 04/18/2021   Visit for screening mammogram 04/18/2021   Erythrocytosis 02/22/2021   Squamous cell cancer of skin of left cheek 03/24/2019   Migraines 01/09/2012   REFERRING PROVIDER: Dr. Lillette Reid  REFERRING DIAG: Right breast cancer  THERAPY DIAG:  Malignant neoplasm of upper-outer quadrant of right breast in female, estrogen receptor positive (HCC)  Abnormal posture  Rationale for Evaluation and Treatment: Rehabilitation  ONSET DATE: 09/12/2023  SUBJECTIVE:  SUBJECTIVE STATEMENT: Patient reports she is here today to be seen by her medical team for her newly diagnosed right breast cancer.   PERTINENT HISTORY:  Patient was diagnosed on 09/12/2023 with right grade 3 invasive ductal carcinoma breast cancer. It measures 5.1 cm and is located in the upper outer quadrant. It is weakly ER positive, PR and HER2 negative with a Ki67 of 60%.   PATIENT GOALS:   reduce lymphedema risk and learn post op HEP.   PAIN:  Are you having pain? No  PRECAUTIONS: Active CA   RED FLAGS: None   HAND DOMINANCE: right  WEIGHT BEARING RESTRICTIONS: No  FALLS:  Has patient fallen in last 6 months? 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  COGNITION: Overall cognitive status: Within functional limits for tasks assessed    POSTURE:  Forward head and rounded shoulders  posture  UPPER EXTREMITY AROM/PROM:  A/PROM RIGHT   eval   Shoulder extension 45  Shoulder flexion 158  Shoulder abduction 160  Shoulder internal rotation 65  Shoulder external rotation 80    (Blank rows = not tested)  A/PROM LEFT   eval  Shoulder extension 42  Shoulder flexion 141  Shoulder abduction 163  Shoulder internal rotation 58  Shoulder external rotation 81    (Blank rows = not tested)  CERVICAL AROM: All within normal limits  UPPER EXTREMITY STRENGTH: WFL  LYMPHEDEMA ASSESSMENTS (in cm):   LANDMARK RIGHT   eval  10 cm proximal to olecranon process 32.8  Olecranon process 26.9  10 cm proximal to ulnar styloid process 26  Just proximal to ulnar styloid process 17.6  Across hand at thumb web space 19.9  At base of 2nd digit 6.2  (Blank rows = not tested)  LANDMARK LEFT   eval  10 cm proximal to olecranon process 32.7  Olecranon process 26.6  10 cm proximal to ulnar styloid process 24.5  Just proximal to ulnar styloid process 18.5  Across hand at thumb web space 18.2  At base of 2nd digit 5.9  (Blank rows = not tested)  L-DEX LYMPHEDEMA SCREENING:  The patient was assessed using the L-Dex machine today to produce a lymphedema index baseline score. The patient will be reassessed on a regular basis (typically every 3 months) to obtain new L-Dex scores. If the score is > 6.5 points away from his/her baseline score indicating onset of subclinical lymphedema, it will be recommended to wear a compression garment for 4 weeks, 12 hours per day and then be reassessed. If the score continues to be > 6.5 points from baseline at reassessment, we will initiate lymphedema treatment. Assessing in this manner has a 95% rate of preventing clinically significant lymphedema.   L-DEX FLOWSHEETS - 10/22/23 1600       L-DEX LYMPHEDEMA SCREENING   Measurement Type Unilateral    L-DEX MEASUREMENT EXTREMITY Upper Extremity    POSITION  Standing    DOMINANT SIDE Right    At  Risk Side Right    BASELINE SCORE (UNILATERAL) -0.1             QUICK DASH SURVEY:  Cindia Crease - 10/22/23 0001     Open a tight or new jar Mild difficulty  Do heavy household chores (wash walls, wash floors) No difficulty    Carry a shopping bag or briefcase No difficulty    Wash your back No difficulty    Use a knife to cut food No difficulty    Recreational activities in which you take some force or impact through your arm, shoulder, or hand (golf, hammering, tennis) No difficulty    During the past week, to what extent has your arm, shoulder or hand problem interfered with your normal social activities with family, friends, neighbors, or groups? Not at all    During the past week, to what extent has your arm, shoulder or hand problem limited your work or other regular daily activities Not at all    Arm, shoulder, or hand pain. None    Tingling (pins and needles) in your arm, shoulder, or hand Mild    Difficulty Sleeping No difficulty    DASH Score 4.55 %              PATIENT EDUCATION:  Education details: Time spent educating patient on aspects of self-care to maximize post op recovery. Patient was educated on where and how to get a post op compression bra to use to reduce post op edema. Patient was also educated on the use of SOZO screenings and surveillance principles for early identification of lymphedema onset. She was instructed to use the post op pillow in the axilla for pressure and pain relief. Patient educated on lymphedema risk reduction and post op shoulder/posture HEP. Person educated: Patient Education method: Explanation, Demonstration, Handout Education comprehension: Patient verbalized understanding and returned demonstration  HOME EXERCISE PROGRAM: Patient was instructed today in a home exercise program today for post op shoulder range of motion. These included active assist shoulder flexion in sitting, scapular retraction, wall walking with shoulder  abduction, and hands behind head external rotation.  She was encouraged to do these twice a day, holding 3 seconds and repeating 5 times when permitted by her physician.   ASSESSMENT:  CLINICAL IMPRESSION: Patient was diagnosed on 09/12/2023 with right grade 3 invasive ductal carcinoma breast cancer. It measures 5.1 cm and is located in the upper outer quadrant. It is weakly ER positive, PR and HER2 negative with a Ki67 of 60%. Her multidisciplinary medical team met prior to her assessments to determine a recommended treatment plan. She is planning to have neoadjuvant chemotherapy followed by a right lumpectomy or mastectomy with a sentinel node biopsy and radiation. She will benefit from a post op PT reassessment to determine needs and from L-Dex screens every 3 months for 2 years to detect subclinical lymphedema.  Pt will benefit from skilled therapeutic intervention to improve on the following deficits: Decreased knowledge of precautions, impaired UE functional use, pain, decreased ROM, postural dysfunction.   PT treatment/interventions: ADL/self-care home management, pt/family education, therapeutic exercise  REHAB POTENTIAL: Excellent  CLINICAL DECISION MAKING: Stable/uncomplicated  EVALUATION COMPLEXITY: Low   GOALS: Goals reviewed with patient? YES  LONG TERM GOALS: (STG=LTG)    Name Target Date Goal status  1 Pt will be able to verbalize understanding of pertinent lymphedema risk reduction practices relevant to her dx specifically related to skin care.  Baseline:  No knowledge 10/22/2023 Achieved at eval  2 Pt will be able to return demo and/or verbalize understanding of the post op HEP related to regaining shoulder ROM. Baseline:  No knowledge 10/22/2023 Achieved at eval  3 Pt will be able to verbalize understanding of the importance of viewing the post  op After Breast CA Class video for further lymphedema risk reduction education and therapeutic exercise.  Baseline:  No knowledge  10/22/2023 Achieved at eval  4 Pt will demo she has regained full shoulder ROM and function post operatively compared to baselines.  Baseline: See objective measurements taken today. 04/23/2024     PLAN:  PT FREQUENCY/DURATION: EVAL and 1 follow up appointment.   PLAN FOR NEXT SESSION: will reassess 3-4 weeks post op to determine needs.   Patient will follow up at outpatient cancer rehab 3-4 weeks following surgery.  If the patient requires physical therapy at that time, a specific plan will be dictated and sent to the referring physician for approval. The patient was educated today on appropriate basic range of motion exercises to begin post operatively and the importance of viewing the After Breast Cancer class video following surgery.  Patient was educated today on lymphedema risk reduction practices as it pertains to recommendations that will benefit the patient immediately following surgery.  She verbalized good understanding.    Physical Therapy Information for After Breast Cancer Surgery/Treatment:  Lymphedema is a swelling condition that you may be at risk for in your arm if you have lymph nodes removed from the armpit area.  After a sentinel node biopsy, the risk is approximately 5-9% and is higher after an axillary node dissection.  There is treatment available for this condition and it is not life-threatening.  Contact your physician or physical therapist with concerns. You may begin the 4 shoulder/posture exercises (see additional sheet) when permitted by your physician (typically a week after surgery).  If you have drains, you may need to wait until those are removed before beginning range of motion exercises.  A general recommendation is to not lift your arms above shoulder height until drains are removed.  These exercises should be done to your tolerance and gently.  This is not a "no pain/no gain" type of recovery so listen to your body and stretch into the range of motion that you  can tolerate, stopping if you have pain.  If you are having immediate reconstruction, ask your plastic surgeon about doing exercises as he or she may want you to wait. We encourage you to view the After Breast Cancer class video following surgery.  You will learn information related to lymphedema risk, prevention and treatment and additional exercises to regain mobility following surgery.   While undergoing any medical procedure or treatment, try to avoid blood pressure being taken or needle sticks from occurring on the arm on the side of cancer.   This recommendation begins after surgery and continues for the rest of your life.  This may help reduce your risk of getting lymphedema (swelling in your arm). An excellent resource for those seeking information on lymphedema is the National Lymphedema Network's web site. It can be accessed at www.lymphnet.org If you notice swelling in your hand, arm or breast at any time following surgery (even if it is many years from now), please contact your doctor or physical therapist to discuss this.  Lymphedema can be treated at any time but it is easier for you if it is treated early on.  If you feel like your shoulder motion is not returning to normal in a reasonable amount of time, please contact your surgeon or physical therapist.  [facility] [phone]. [address], [place] [address]  [facility] CLASS After Breast Cancer Class  After Breast Cancer Class is a specially  designed exercise class video to assist you in a safe recover after having breast cancer surgery.  In this video you will learn how to get back to full function whether your drains were just removed or if you had surgery a month ago. The video can be viewed on this page: https://www.boyd-meyer.org/ or on YouTube here: [email or web address].  Class Goals  Understand specific stretches to  improve the flexibility of you chest and shoulder. Learn ways to safely strengthen your upper body and improve your posture. Understand the warning signs of infection and why you may be at risk for an arm infection. Learn about Lymphedema and prevention.  ** You do not need to view this video until after surgery.  Drains should be removed to participate in the recommended exercises on the video.  Patient was instructed today in a home exercise program today for post op shoulder range of motion. These included active assist shoulder flexion in sitting, scapular retraction, wall walking with shoulder abduction, and hands behind head external rotation.  She was encouraged to do these twice a day, holding 3 seconds and repeating 5 times when permitted by her physician.  Rollin Clock, [place] 10/22/23 4:21 PM

## 2023-10-22 NOTE — Assessment & Plan Note (Signed)
 10/07/2023:Screening mammogram detected right breast mass at 12:00 measuring 5.1 cm, 1 axillary lymph node biopsy was benign biopsy of the mass grade 3 IDC with necrosis ER 30% weak, PR 0%, Ki67 60%, HER2 negative  Pathology and radiology counseling: Discussed with the patient, the details of pathology including the type of breast cancer,the clinical staging, the significance of ER, PR and HER-2/neu receptors and the implications for treatment. After reviewing the pathology in detail, we proceeded to discuss the different treatment options between surgery, radiation, chemotherapy, antiestrogen therapies.  Treatment plan: Neoadjuvant chemotherapy with Taxol carbo Keytruda followed by Adriamycin Cytoxan Keytruda Breast conserving surgery with sentinel lymph node biopsy Adjuvant radiation therapy Antiestrogen therapy with the final path is estrogen receptor positive  Chemotherapy Counseling: I discussed the risks and benefits of chemotherapy including the risks of nausea/ vomiting, risk of infection from low WBC count, fatigue due to chemo or anemia, bruising or bleeding due to low platelets, mouth sores, loss/ change in taste and decreased appetite. Liver and kidney function will be monitored through out chemotherapy as abnormalities in liver and kidney function may be a side effect of treatment.  Immunotherapy related adverse effects were discussed in detail and neuropathy risk from Taxol as well. Risk of permanent bone marrow dysfunction and leukemia due to chemo were also discussed.  Plan: CT CAP and bone scan Echocardiogram Chemo class Port placement Clinical trial participation  Return to clinic in 2 weeks to start chemotherapy

## 2023-10-23 ENCOUNTER — Other Ambulatory Visit: Payer: Self-pay | Admitting: *Deleted

## 2023-10-23 ENCOUNTER — Encounter: Payer: Self-pay | Admitting: Hematology and Oncology

## 2023-10-23 ENCOUNTER — Telehealth: Payer: Self-pay

## 2023-10-23 ENCOUNTER — Telehealth: Payer: Self-pay | Admitting: *Deleted

## 2023-10-23 ENCOUNTER — Other Ambulatory Visit: Payer: Self-pay

## 2023-10-23 DIAGNOSIS — Z170 Estrogen receptor positive status [ER+]: Secondary | ICD-10-CM

## 2023-10-23 NOTE — Telephone Encounter (Deleted)
 [employer] 2021-05 - Specimen Collection Study to Evaluate Biomarkers in Subjects with Cancer    The [name] was identified by Dr. Gudena as a potential candidate for the above study during Breast Clinic on 10/22/2023. Mrs. Gabrielle Cross was contacted by phone call regarding the above study today. This Designer, industrial/product introduced the study and provided a summary. Patient requested additional information to review in detail. The blank consent form along with the HIPAA authorization was sent to her via email.  Patient stated she would reach out to the research staff if she decides to participate. Mrs. Gabrielle Cross was thanked for her time and consideration of the study.  Renelda Kilian, Ph.D. Clinical Research Coordinator [phone] 10/23/2023 12:13 PM

## 2023-10-23 NOTE — Telephone Encounter (Signed)
 LVM to patient in reference to upcoming Breast MRI and education class appointment on 5/28 arrive at 1230 appointments are back to back, gave location and time for both, left my contact to call back

## 2023-10-23 NOTE — Telephone Encounter (Addendum)
 S2205, ICE COMPRESS: RANDOMIZED TRIAL OF LIMB CRYOCOMPRESSION VERSUS CONTINUOUS COMPRESSION VERSUS LOW CYCLIC COMPRESSION FOR THE PREVENTION OF TAXANE-INDUCED PERIPHERAL NEUROPATHY    The [name] was identified by Dr. Gudena as a potential candidate for the above study during Breast Clinic on 10/22/2023. [name] was contacted by phone regarding the above study on 10/23/2023. This Designer, industrial/product introduced the study and provided a summary. Patient requested additional information to review in detail. The study summary, a blank consent form, and the HIPAA authorization form were sent to her via email.   Patient stated she would reach out to the research staff if she decides to participate. [name] was thanked for her time and consideration of the study.  Alekzander Cardell, Ph.D. Clinical Research Coordinator [phone] 10/23/2023 12:22 PM

## 2023-10-24 ENCOUNTER — Encounter

## 2023-10-24 ENCOUNTER — Other Ambulatory Visit

## 2023-10-24 ENCOUNTER — Encounter: Payer: Self-pay | Admitting: Hematology and Oncology

## 2023-10-24 ENCOUNTER — Other Ambulatory Visit: Payer: Self-pay | Admitting: Hematology and Oncology

## 2023-10-24 DIAGNOSIS — Z170 Estrogen receptor positive status [ER+]: Secondary | ICD-10-CM

## 2023-10-28 ENCOUNTER — Other Ambulatory Visit: Payer: Self-pay

## 2023-10-28 ENCOUNTER — Encounter: Payer: Self-pay | Admitting: Hematology and Oncology

## 2023-10-28 ENCOUNTER — Encounter (HOSPITAL_BASED_OUTPATIENT_CLINIC_OR_DEPARTMENT_OTHER): Payer: Self-pay | Admitting: General Surgery

## 2023-10-29 ENCOUNTER — Ambulatory Visit (HOSPITAL_COMMUNITY)
Admission: RE | Admit: 2023-10-29 | Discharge: 2023-10-29 | Disposition: A | Discharge status: Home / Self Care | Source: Ambulatory Visit | Attending: Hematology and Oncology | Admitting: Hematology and Oncology

## 2023-10-29 ENCOUNTER — Inpatient Hospital Stay

## 2023-10-29 DIAGNOSIS — Z170 Estrogen receptor positive status [ER+]: Secondary | ICD-10-CM | POA: Diagnosis not present

## 2023-10-29 DIAGNOSIS — C50911 Malignant neoplasm of unspecified site of right female breast: Secondary | ICD-10-CM | POA: Diagnosis not present

## 2023-10-29 DIAGNOSIS — R599 Enlarged lymph nodes, unspecified: Secondary | ICD-10-CM | POA: Diagnosis not present

## 2023-10-29 DIAGNOSIS — C50411 Malignant neoplasm of upper-outer quadrant of right female breast: Secondary | ICD-10-CM | POA: Insufficient documentation

## 2023-10-29 MED ORDER — GADOBUTROL 1 MMOL/ML IV SOLN
8.0000 mL | Freq: Once | INTRAVENOUS | Status: AC | PRN
  Administered 2023-10-29: 8 mL via INTRAVENOUS

## 2023-10-30 ENCOUNTER — Encounter: Payer: Self-pay | Admitting: Hematology and Oncology

## 2023-10-30 ENCOUNTER — Encounter: Payer: Self-pay | Admitting: *Deleted

## 2023-10-30 ENCOUNTER — Telehealth: Payer: Self-pay | Admitting: *Deleted

## 2023-10-30 ENCOUNTER — Encounter (HOSPITAL_BASED_OUTPATIENT_CLINIC_OR_DEPARTMENT_OTHER)
Admission: RE | Admit: 2023-10-30 | Discharge: 2023-10-30 | Disposition: A | Discharge status: Home / Self Care | Source: Ambulatory Visit | Attending: General Surgery | Admitting: General Surgery

## 2023-10-30 DIAGNOSIS — I10 Essential (primary) hypertension: Secondary | ICD-10-CM | POA: Insufficient documentation

## 2023-10-30 DIAGNOSIS — Z01810 Encounter for preprocedural cardiovascular examination: Secondary | ICD-10-CM | POA: Diagnosis not present

## 2023-10-30 DIAGNOSIS — Z01818 Encounter for other preprocedural examination: Secondary | ICD-10-CM | POA: Diagnosis present

## 2023-10-30 MED ORDER — CHLORHEXIDINE GLUCONATE CLOTH 2 % EX PADS: 6.0000 | MEDICATED_PAD | Freq: Once | CUTANEOUS | Status: DC

## 2023-10-30 NOTE — Telephone Encounter (Signed)
 Spoke with patient to follow up from [facility] 5/21 and assess navigation needs. Patient denies any questions or concerns at this time. Encouraged her to call should anything arise.

## 2023-10-30 NOTE — Progress Notes (Signed)
 Pharmacist Chemotherapy Monitoring - Initial Assessment    Anticipated start date: 11/06/23   The following has been reviewed per standard work regarding the patient's treatment regimen: The patient's diagnosis, treatment plan and drug doses, and organ/hematologic function Lab orders and baseline tests specific to treatment regimen  The treatment plan start date, drug sequencing, and pre-medications Prior authorization status  Patient's documented medication list, including drug-drug interaction screen and prescriptions for anti-emetics and supportive care specific to the treatment regimen The drug concentrations, fluid compatibility, administration routes, and timing of the medications to be used The patient's access for treatment and lifetime cumulative dose history, if applicable  The patient's medication allergies and previous infusion related reactions, if applicable   Changes made to treatment plan:  N/A  Follow up needed:  Pending authorization for treatment  ECHO on 10/31/23 Port to be placed on 6/425   Cherylynn Cosier, [facility], 10/30/2023  10:31 AM

## 2023-10-31 ENCOUNTER — Inpatient Hospital Stay

## 2023-10-31 ENCOUNTER — Inpatient Hospital Stay: Admitting: *Deleted

## 2023-10-31 ENCOUNTER — Encounter: Payer: Self-pay | Admitting: Hematology and Oncology

## 2023-10-31 ENCOUNTER — Ambulatory Visit (HOSPITAL_COMMUNITY)
Admission: RE | Admit: 2023-10-31 | Discharge: 2023-10-31 | Disposition: A | Discharge status: Home / Self Care | Source: Ambulatory Visit | Attending: Hematology and Oncology | Admitting: Hematology and Oncology

## 2023-10-31 DIAGNOSIS — I10 Essential (primary) hypertension: Secondary | ICD-10-CM | POA: Diagnosis not present

## 2023-10-31 DIAGNOSIS — Z0189 Encounter for other specified special examinations: Secondary | ICD-10-CM

## 2023-10-31 DIAGNOSIS — Z170 Estrogen receptor positive status [ER+]: Secondary | ICD-10-CM

## 2023-10-31 DIAGNOSIS — C50411 Malignant neoplasm of upper-outer quadrant of right female breast: Secondary | ICD-10-CM | POA: Insufficient documentation

## 2023-10-31 DIAGNOSIS — Z01818 Encounter for other preprocedural examination: Secondary | ICD-10-CM | POA: Diagnosis not present

## 2023-10-31 LAB — ECHOCARDIOGRAM COMPLETE
AR max vel: 3 cm2
AV Area VTI: 2.76 cm2
AV Area mean vel: 2.73 cm2
AV Mean grad: 8 mmHg
AV Peak grad: 16.2 mmHg
Ao pk vel: 2.01 m/s
Area-P 1/2: 3.89 cm2
Calc EF: 62.3 %
S' Lateral: 3.1 cm
Single Plane A2C EF: 61.3 %
Single Plane A4C EF: 64.3 %

## 2023-10-31 NOTE — Research (Signed)
 [employer] 2021-05 - Specimen Collection Study to Evaluate Biomarkers in Subjects with Cancer    [name] was identified by Dr. Gudena as a potential candidate for the above listed study.  This Clinical Research Coordinator met with Gabrielle Cross, [id number], on 10/31/23 in a manner and location that ensures patient privacy to discuss participation in the above listed research study.  Patient is Unaccompanied.  A copy of the informed consent document with embedded HIPAA language was provided to the patient.  Patient reads, speaks, and understands [place].    Patient was provided with the business card of this Coordinator and encouraged to contact the research team with any questions.  Patient was provided the option of taking informed consent documents home to review and was encouraged to review at their convenience with their support network, including other care providers. Patient is comfortable with making a decision regarding study participation today.  As outlined in the informed consent form, this Coordinator and Dian Formosa discussed the purpose of the research study, the investigational nature of the study, study procedures and requirements for study participation, potential risks and benefits of study participation, as well as alternatives to participation. This study is not blinded. The patient understands participation is voluntary and they may withdraw from study participation at any time.  This study does not involve randomization.  This study does not involve an investigational drug or device. This study does not involve a placebo. Patient understands enrollment is pending full eligibility review.   Confidentiality and how the patient's information will be used as part of study participation were discussed.  Patient was informed there is reimbursement provided for their time and effort spent on trial participation.    All questions were answered to patient's  satisfaction.  The informed consent with embedded HIPAA language was reviewed page by page.  The patient's mental and emotional status is appropriate to provide informed consent, and the patient verbalizes an understanding of study participation.  Patient has agreed to participate in the above listed research study and has voluntarily signed the informed consent version 16 Jun 2020 (Revised 02 Jul 2021) 16 Jun 2020 (Revised 02 Jul 2021) on 10/31/23 at 9:27 AM.  The patient was provided with a copy of the signed informed consent form with embedded HIPAA language for their reference.  No study specific procedures were obtained prior to the signing of the informed consent document.  Approximately 20 minutes were spent with the patient reviewing the informed consent documents.  Patient was not requested to complete a Release of Information form.   Eligibility: Eligibility criteria reviewed with patient. This coordinator has reviewed this patient's inclusion and exclusion criteria. Patient has a history of squamous cell carcinoma, diagnosed on 02/11/2019. We then confirmed with study team that patient is not eligible for study participation. Patient will not continue with enrollment.  Data Collection: Patient was interviewed to collect the following information.  Medical History:  High Blood Pressure  Yes Coronary Artery Disease No Lupus    No Rheumatoid Arthritis  Yes  Diabetes   No      Lynch Syndrome  No  Is the patient currently taking a magnesium supplement?   No  Does the patient have a personal history of cancer (greater than 5 years ago)?  Yes  If yes, Cancer type and date of diagnosis?    Well differentiated squamous cell carcinoma with superficial infiltration base involvement, 02/11/2019 Has this previous diagnosis been treated? Yes      If  so, treatment type?   Removed with procedure Start and end dates of last treatment cycle? Procedure done on 02/11/2019  Does the patient have a family  history of cancer in 1st or 2nd degree relatives? No  Does the patient have history of alcohol consumption? Yes   If yes, current or former? current Number of years? 38 Drinks per week? One  Does the patient have history of cigarette, cigar, pipe, or chewing tobacco use?  No   Blood collection for the above study was not scheduled for today and will not be be performed due to pt's ineligibility.   Patient was thanked for their participation in this study.    Kadin Canipe, Ph.D. Clinical Research Coordinator [phone] 10/31/2023 1:30 PM

## 2023-10-31 NOTE — Progress Notes (Signed)
*  PRELIMINARY RESULTS* Echocardiogram 2D Echocardiogram has been performed.  Gabrielle Cross D [name] 10/31/2023, 10:51 AM

## 2023-10-31 NOTE — Research (Signed)
 Z6109, ICE COMPRESS: RANDOMIZED TRIAL OF LIMB CRYOCOMPRESSION VERSUS CONTINUOUS COMPRESSION VERSUS LOW CYCLIC COMPRESSION FOR THE PREVENTION OF TAXANE-INDUCED PERIPHERAL NEUROPATHY   [name] was identified by Dr. Gudena as a potential candidate for the above listed study.  This Clinical Research Coordinator met with Gabrielle Cross, [name], on 10/31/23 in a manner and location that ensures patient privacy to discuss participation in the above listed research study.  Patient is Unaccompanied.  A copy of the informed consent document and separate HIPAA Authorization was provided to the patient via email on 10/23/2023.  Patient reads, speaks, and understands [place].    Patient was provided with the business card of this Coordinator and encouraged to contact the research team with any questions.  Patient was provided the option of taking informed consent documents home to review and was encouraged to review at their convenience with their support network, including other care providers. Patient is comfortable with making a decision regarding study participation today.  As outlined in the informed consent form, this Coordinator and Dian Formosa discussed the purpose of the research study, the investigational nature of the study, study procedures and requirements for study participation, potential risks and benefits of study participation, as well as alternatives to participation. This study is not blinded. The patient understands participation is voluntary and they may withdraw from study participation at any time.  Each study arm was reviewed, and randomization discussed.  Potential side effects were reviewed with patient as outlined in the consent form, and patient made aware there may be side effects not yet known. This study does not involve a placebo. Patient understands enrollment is pending full eligibility review.   Confidentiality and how the patient's information will be used as  part of study participation were discussed.  Patient was informed there is not reimbursement provided for their time and effort spent on trial participation.  The patient is encouraged to discuss research study participation with their insurance provider to determine what costs they may incur as part of study participation, including research related injury.    All questions were answered to patient's satisfaction.  The informed consent and separate HIPAA Authorization was reviewed page by page.  The patient's mental and emotional status is appropriate to provide informed consent, and the patient verbalizes an understanding of study participation.  Patient has agreed to participate in the above listed research study and has voluntarily signed the informed consent form (Protocol Version Date 04/02/23) and separate HIPAA Authorization, version 04/17/2023 on 10/31/23 at 9:22AM.  The patient was provided with a copy of the signed informed consent form and separate HIPAA Authorization for their reference.  No study specific procedures were obtained prior to the signing of the informed consent document.  Approximately 30 minutes were spent with the patient reviewing the informed consent documents.  Patient was not requested to complete a Release of Information form.   ELIGIBILITY CHECK This Coordinator has reviewed this patient's inclusion and exclusion criteria and confirmed Gabrielle Cross is eligible for study participation.  Patient will continue with enrollment. Eligibility confirmed by treating investigator, who also agrees that patient should proceed with enrollment.   Confirmed the following with patient today: - The patient has not previously received neurotoxic chemotherapy - The patient does not have pre-existing clinical peripheral neuropathy from any cause - The patient does not have a history of Raynaud's phenomenon, cold agglutinin disease, cryoglobulinemia, cryofibrinogenemia, post-traumatic  cold dystrophy, or peripheral arterial ischemia.  - The patient does not have any open skin  wounds or ulcers of the limbs   Patient agreed to participate in specimen banking.   Patient states they are able to complete PROs in [place].   Patient agreed to complete PROs at all scheduled assessments, and complete the baseline PRO questionnaires within 28 days prior to randomization.    BASELINE PROs: Pt completed EORTC QLQ-CIPN20 and PROMIS-29 Profile v2.1 after consent forms were signed today.  Patient completed without difficulty and research nurse Ramonia Burns, RN checked for completeness and accuracy.    NEURO ASSESSMENT: All neuro assessments (Neuropen, T[place] ,and Timed Get Up and Go) were completed by Ramonia Burns, RN along with this coordinator. Patient tolerated all testing without complaint.    Plan: Plan for randomization within 3 calendar days prior to initiation of chemotherapy. Clothing recommendations were reviewed with pt and a patient information hand-out was provided. Patient verbalized understanding of the investigational nature of this study.  Patient was thanked for her time and willingness to participate in this study. Pt knows to contact the research team at any time with questions or concerns.  Mauri Temkin, Ph.D. Clinical Research Coordinator [phone] 10/31/2023 3:23 PM

## 2023-11-03 ENCOUNTER — Encounter: Payer: Self-pay | Admitting: General Practice

## 2023-11-03 DIAGNOSIS — Z170 Estrogen receptor positive status [ER+]: Secondary | ICD-10-CM

## 2023-11-03 NOTE — Research (Signed)
 W0981, ICE COMPRESS: RANDOMIZED TRIAL OF LIMB CRYOCOMPRESSION VERSUS CONTINUOUS COMPRESSION VERSUS LOW CYCLIC COMPRESSION FOR THE PREVENTION  OF TAXANE-INDUCED PERIPHERAL NEUROPATHY  This Nurse has reviewed this patient's inclusion and exclusion criteria as a second review and confirms Gabrielle Cross is eligible for study participation.  Patient may continue with enrollment.  Ramonia Burns BSN RN Clinical Research Nurse [facility] Direct Dial: [phone] 11/03/2023  10:09 AM

## 2023-11-03 NOTE — Research (Signed)
 B1478, ICE COMPRESS: RANDOMIZED TRIAL OF LIMB CRYOCOMPRESSION VERSUS CONTINUOUS COMPRESSION VERSUS LOW CYCLIC COMPRESSION FOR THE PREVENTION  OF TAXANE-INDUCED PERIPHERAL NEUROPATHY  Research labs orders for patient to be drawn on 11-06-23 prior to her infusion.   Ramonia Burns BSN RN Clinical Research Nurse [facility] Direct Dial: [phone] 11/03/2023  4:21 PM

## 2023-11-03 NOTE — Progress Notes (Signed)
 [facility] Spiritual Care Note  Left voicemail to follow up after [facility], encouraging return call.   [address] [name], [place], [facility] Pager [phone] Voicemail [phone]

## 2023-11-03 NOTE — Research (Signed)
 Z6109, ICE COMPRESS: RANDOMIZED TRIAL OF LIMB CRYOCOMPRESSION VERSUS CONTINUOUS COMPRESSION VERSUS LOW CYCLIC COMPRESSION FOR THE PREVENTION OF TAXANE-INDUCED PERIPHERAL NEUROPATHY   This coordinator called Ms. Swiney this morning to confirm that pt does not have any open skin wounds or ulcers of the limbs at the time of randomization.   Eligibility confirmed by treating investigator, who also agrees that patient should proceed with enrollment.  Pt was registered in the Oncology Patient Enrollment Network (OPEN) system and randomized to Arm 3 (Low Cyclic Compression). Pt was informed of her treatment arm assignment and had no questions at this time.   Pt was thanked for her time and participation and encouraged to contact the research team with any questions.   Masiya Claassen, Ph.D. Clinical Research Coordinator [phone] 11/03/2023 10:15 AM

## 2023-11-04 ENCOUNTER — Other Ambulatory Visit: Payer: Self-pay | Admitting: Hematology and Oncology

## 2023-11-04 ENCOUNTER — Encounter (HOSPITAL_COMMUNITY)
Admission: RE | Admit: 2023-11-04 | Discharge: 2023-11-04 | Disposition: A | Discharge status: Home / Self Care | Source: Ambulatory Visit | Attending: Hematology and Oncology | Admitting: Hematology and Oncology

## 2023-11-04 ENCOUNTER — Inpatient Hospital Stay: Admitting: Pharmacist

## 2023-11-04 ENCOUNTER — Ambulatory Visit: Payer: Self-pay | Admitting: Genetic Counselor

## 2023-11-04 ENCOUNTER — Encounter: Payer: Self-pay | Admitting: Hematology and Oncology

## 2023-11-04 ENCOUNTER — Ambulatory Visit (HOSPITAL_COMMUNITY)
Admission: RE | Admit: 2023-11-04 | Discharge: 2023-11-04 | Disposition: A | Discharge status: Home / Self Care | Source: Ambulatory Visit | Attending: Hematology and Oncology | Admitting: Hematology and Oncology

## 2023-11-04 ENCOUNTER — Inpatient Hospital Stay: Attending: Hematology and Oncology

## 2023-11-04 ENCOUNTER — Telehealth: Payer: Self-pay

## 2023-11-04 ENCOUNTER — Telehealth: Payer: Self-pay | Admitting: *Deleted

## 2023-11-04 ENCOUNTER — Encounter: Payer: Self-pay | Admitting: Genetic Counselor

## 2023-11-04 DIAGNOSIS — Z170 Estrogen receptor positive status [ER+]: Secondary | ICD-10-CM

## 2023-11-04 DIAGNOSIS — C50411 Malignant neoplasm of upper-outer quadrant of right female breast: Secondary | ICD-10-CM | POA: Diagnosis not present

## 2023-11-04 DIAGNOSIS — Z1379 Encounter for other screening for genetic and chromosomal anomalies: Secondary | ICD-10-CM | POA: Insufficient documentation

## 2023-11-04 DIAGNOSIS — C50919 Malignant neoplasm of unspecified site of unspecified female breast: Secondary | ICD-10-CM | POA: Diagnosis not present

## 2023-11-04 DIAGNOSIS — Z5111 Encounter for antineoplastic chemotherapy: Secondary | ICD-10-CM | POA: Insufficient documentation

## 2023-11-04 DIAGNOSIS — Z1732 Human epidermal growth factor receptor 2 negative status: Secondary | ICD-10-CM | POA: Insufficient documentation

## 2023-11-04 DIAGNOSIS — R590 Localized enlarged lymph nodes: Secondary | ICD-10-CM | POA: Diagnosis not present

## 2023-11-04 DIAGNOSIS — Z5112 Encounter for antineoplastic immunotherapy: Secondary | ICD-10-CM | POA: Insufficient documentation

## 2023-11-04 DIAGNOSIS — K760 Fatty (change of) liver, not elsewhere classified: Secondary | ICD-10-CM | POA: Diagnosis not present

## 2023-11-04 DIAGNOSIS — Z83719 Family history of colon polyps, unspecified: Secondary | ICD-10-CM

## 2023-11-04 DIAGNOSIS — R160 Hepatomegaly, not elsewhere classified: Secondary | ICD-10-CM | POA: Diagnosis not present

## 2023-11-04 DIAGNOSIS — Z860100 Personal history of colon polyps, unspecified: Secondary | ICD-10-CM

## 2023-11-04 DIAGNOSIS — Z79620 Long term (current) use of immunosuppressive biologic: Secondary | ICD-10-CM | POA: Insufficient documentation

## 2023-11-04 DIAGNOSIS — Z1722 Progesterone receptor negative status: Secondary | ICD-10-CM | POA: Insufficient documentation

## 2023-11-04 MED ORDER — TECHNETIUM TC 99M MEDRONATE IV KIT
20.0000 | PACK | Freq: Once | INTRAVENOUS | Status: AC | PRN
  Administered 2023-11-04: 20.9 via INTRAVENOUS

## 2023-11-04 MED ORDER — IOHEXOL 300 MG/ML  SOLN
100.0000 mL | Freq: Once | INTRAMUSCULAR | Status: AC | PRN
  Administered 2023-11-04: 100 mL via INTRAVENOUS

## 2023-11-04 NOTE — Telephone Encounter (Signed)
 Dian Formosa and [name] currently in office for education class.  Forms given to them in conference room.Aaron Aas

## 2023-11-04 NOTE — Telephone Encounter (Signed)
 On 10/31/2023 This nurse completed [employer] Ref [id number] for Dian Formosa and [employer] FMLA for her spouse Cornelious Dikes: [phone] sent to provider for review, signature and return. Received today upon return to office.  Successfully faxed to claim benefgit managers.  Correct form with claim numbers.  Forms to bin for items to be scanned.  No records requested.  Copies to be mailed to address on file. [address] [place] [address] No further instructions received, actions performed or required by this nurse.

## 2023-11-04 NOTE — Anesthesia Preprocedure Evaluation (Signed)
 Anesthesia Evaluation  Patient identified by MRN, date of birth, ID band Patient awake    Reviewed: Allergy & Precautions, Patient's Chart, lab work & pertinent test results  History of Anesthesia Complications Negative for: history of anesthetic complications  Airway Mallampati: III  TM Distance: >3 FB Neck ROM: Full    Dental  (+)    Pulmonary sleep apnea    Pulmonary exam normal        Cardiovascular hypertension, Pt. on medications Normal cardiovascular exam     Neuro/Psych  Headaches    GI/Hepatic negative GI ROS, Neg liver ROS,,,  Endo/Other  negative endocrine ROS    Renal/GU negative Renal ROS  negative genitourinary   Musculoskeletal  (+) Arthritis ,    Abdominal   Peds  Hematology negative hematology ROS (+)   Anesthesia Other Findings Right breast ca  Reproductive/Obstetrics                              Anesthesia Physical Anesthesia Plan  ASA: 2  Anesthesia Plan: General   Post-op Pain Management: Tylenol PO (pre-op)*   Induction: Intravenous  PONV Risk Score and Plan: 3 and Treatment may vary due to age or medical condition, Ondansetron , Dexamethasone  and Midazolam   Airway Management Planned: LMA  Additional Equipment: None  Intra-op Plan:   Post-operative Plan: Extubation in OR  Informed Consent: I have reviewed the patients History and Physical, chart, labs and discussed the procedure including the risks, benefits and alternatives for the proposed anesthesia with the patient or authorized representative who has indicated his/her understanding and acceptance.     Dental advisory given  Plan Discussed with: CRNA  Anesthesia Plan Comments:         Anesthesia Quick Evaluation

## 2023-11-04 NOTE — Progress Notes (Signed)
 D3 injection has been changed from Udenyca to Neulasta for use in this patient due to insurance preference   Glendora Landsman, PharmD, BCPS Clinical Pharmacist

## 2023-11-04 NOTE — Progress Notes (Signed)
 [facility]       Telephone: [phone]?Fax: [phone]   Oncology Clinical Pharmacist Practitioner Initial Assessment  Gabrielle Cross is a 58 y.o. female with a diagnosis of breast cancer. They were contacted today via in-person visit. She is accompanied by her husband Verdie Gladden.  Indication/Regimen Pembrolizumab (Keytruda), paclitaxel (Taxol), carboplatin (Paraplatin ) followed by pembrolizumab Magnolia Schroeder), doxorubicin (Adriamycin), cyclophosphamide (Cytoxan) is being used appropriately for treatment of breast cancer by Dr. Vinay Gudena.      Wt Readings from Last 1 Encounters:  10/22/23 184 lb 6.4 oz (83.6 kg)    Estimated body surface area is 1.88 meters squared as calculated from the following:   Height as of 10/28/23: 5' (1.524 m).   Weight as of 10/28/23: 184 lb (83.5 kg).  The dosing regimen cycle is every 21 days x 4 cycles  Pembrolizumab (200 mg) on Day 1 Paclitaxel (80 mg/m2) on Days 1, 8, 15 Carboplatin (AUC 1.5) on Day 1, 8, 15 Filgrastim (300 mcg or 480 mcg based on weight)  on Days 16, 17, 18 -- per Dr. Gudena, will add if needed  Followed by the below regimen cycle every 21 days x 4 cycles  Pembrolizumab (200 mg) Day 1 Doxorubicin (60 mg/m2) Day 1 Cyclophosphamide (600 mg/m2) Day 1 Pegfilgrastim (6 mg) on Day 3  It is planned to continue until treatment plan completion or unacceptable toxicity. The tentative start date is: 11/06/23  Dose Modifications Per Dr. Lee Public, no chemotherapy dose modifications at this time   Allergies Allergies  Allergen Reactions   Shellfish Allergy Diarrhea    Vitals: No vitals or labs were done today for this chemotherapy education visit     10/28/2023   11:16 AM 10/22/2023    1:11 PM 06/17/2023    8:13 AM  Oncology Vitals  Height 152 cm 152 cm   Weight 83.462 kg 83.643 kg   Weight (lbs) 184 lbs 184 lbs 6 oz   BMI 35.94 kg/m2 36.01 kg/m2   Temp  99.1 F (37.3 C)   Pulse Rate  91   BP  134/84 134/84   Resp  17   SpO2  97 %   BSA (m2) 1.88 m2 1.88 m2      Laboratory Data    Latest Ref Rng & Units 10/22/2023   12:09 PM 09/17/2022    8:30 AM 04/19/2022    7:00 AM  CBC EXTENDED  WBC 4.0 - 10.5 K/uL 7.3  5.6  5.4   RBC 3.87 - 5.11 MIL/uL 5.19  5.54  5.84   Hemoglobin 12.0 - 15.0 g/dL 25.9  56.3  87.5   HCT 36.0 - 46.0 % 43.2  46.0  49.0   Platelets 150 - 400 K/uL 331  311.0  406   NEUT# 1.7 - 7.7 K/uL 4.5  3.2    Lymph# 0.7 - 4.0 K/uL 2.1  2.0         Latest Ref Rng & Units 10/22/2023   12:09 PM 09/17/2022    8:30 AM 04/18/2021   12:38 PM  CMP  Glucose 70 - 99 mg/dL 643  329  91   BUN 6 - 20 mg/dL 17  14  15    Creatinine 0.44 - 1.00 mg/dL 5.18  8.41  6.60   Sodium 135 - 145 mmol/L 137  139  135   Potassium 3.5 - 5.1 mmol/L 3.8  4.0  3.9   Chloride 98 - 111 mmol/L 102  103  101  CO2 22 - 32 mmol/L 29  28  24    Calcium 8.9 - 10.3 mg/dL 9.6  9.4  9.8   Total Protein 6.5 - 8.1 g/dL 7.4  7.4    Total Bilirubin 0.0 - 1.2 mg/dL 0.4  0.4    Alkaline Phos 38 - 126 U/L 78  94    AST 15 - 41 U/L 20  14    ALT 0 - 44 U/L 29  20      Contraindications Contraindications were reviewed? Yes Contraindications to therapy were identified? No   Safety Precautions The following safety precautions were reviewed:  Fever: reviewed the importance of having a thermometer and the Centers for Disease Control and Prevention (CDC) definition of fever which is 100.15F (38C) or higher. Patient should call 24/7 triage at [phone] if experiencing a fever or any other symptoms Decreased white blood cells (WBCs) and increased risk for infection Decreased platelet count and increased risk of bleeding Decreased hemoglobin, part of the red blood cells that carry iron and oxygen Nausea or vomiting Diarrhea or constipation Hair Loss Fatigue Changes in liver function Rash Peripheral Neuropathy Hypersensitivity reactions Pembrolizumab toxicities (skin, lung, liver, thyroid , kidneys,  vision) Changes in color of urine Mouth sores MDS/AML Grapefruit products may interact with paclitaxel. Avoid use Handling body fluids and waste Intimacy, sexual activity, contraception, fertility  Medication Reconciliation Current Outpatient Medications  Medication Sig Dispense Refill   dexamethasone  (DECADRON ) 4 MG tablet With Adriamycin Cytoxan take 1 tablet day after chemo and 1 tablet 2 days after chemo with food 8 tablet 0   hydrochlorothiazide  (HYDRODIURIL ) 12.5 MG tablet Take 1 tablet (12.5 mg total) by mouth daily. 90 tablet 4   imipramine  (TOFRANIL ) 25 MG tablet Take 3 tablets (75 mg total) by mouth daily. 90 tablet 5   lidocaine -prilocaine  (EMLA ) cream Apply to affected area once 30 g 3   ondansetron  (ZOFRAN ) 8 MG tablet Take 1 tablet (8 mg total) by mouth every 8 (eight) hours as needed for nausea or vomiting. Start on the third day after chemotherapy. 30 tablet 1   prochlorperazine  (COMPAZINE ) 10 MG tablet Take 1 tablet (10 mg total) by mouth every 6 (six) hours as needed for nausea or vomiting. 30 tablet 1   rizatriptan  (MAXALT ) 10 MG tablet Take 1 tablet (10 mg total) by mouth as needed. may repeat once after 2 hours 4 tablet 5   No current facility-administered medications for this visit.   Facility-Administered Medications Ordered in Other Visits  Medication Dose Route Frequency Provider Last Rate Last Admin   Chlorhexidine  Gluconate Cloth 2 % PADS 6 each  6 each Topical Once Caralyn Chandler, MD       And   Chlorhexidine  Gluconate Cloth 2 % PADS 6 each  6 each Topical Once Lillette Reid III, MD        Medication reconciliation is based on the patient's most recent medication list in the electronic medical record (EMR) including herbal products and OTC medications.   The patient's medication list was reviewed today with the patient? Yes   Drug-drug interactions (DDIs) DDIs were evaluated? Yes Significant DDIs identified? No   Drug-Food Interactions Drug-food  interactions were evaluated? Yes Drug-food interactions identified? Avoid grapefruit products  Follow-up Plan  Treatment start date: 11/06/23 Port placement date: 11/05/23 ECHO date: 10/31/23 We reviewed the prescriptions, premedications, and treatment regimen with the patient. Possible side effects of the treatment regimen were reviewed and management strategies were discussed.  Can use over-the-counter (OTC)  options of loperamide (Imodium) as needed for diarrhea, loratadine (Claritin) as needed for G-CSF bone pain, and docusate + senna (Senna-S) as needed for constipation.  Clinical pharmacy will assist Dr. Vinay Gudena and Dian Formosa on an as needed basis going forward  Dian Formosa participated in the discussion, expressed understanding, and voiced agreement with the above plan. All questions were answered to her satisfaction. The patient was advised to contact the clinic at [phone] with any questions or concerns prior to her return visit.   I spent 60 minutes assessing the patient.  Canyon Lohr A. Webb Hake, PharmD, BCOP, CPP  Althea Atkinson, RPH-CPP, 11/04/2023 3:14 PM  **Disclaimer: This note was dictated with voice recognition software. Similar sounding words can inadvertently be transcribed and this note may contain transcription errors which may not have been corrected upon publication of note.**

## 2023-11-04 NOTE — Progress Notes (Signed)
 HPI:   Gabrielle Cross was previously seen in the [facility] due to a personal history of breast cancer and concerns regarding a hereditary predisposition to cancer.    Gabrielle Cross recent genetic test results were disclosed to her by telephone. These results and recommendations are discussed in more detail below.  CANCER HISTORY:  Oncology History  Malignant neoplasm of upper-outer quadrant of right breast in female, estrogen receptor positive (HCC)  10/07/2023 Initial Diagnosis   Screening mammogram detected right breast mass at 12:00 measuring 5.1 cm, 1 axillary lymph node biopsy was benign biopsy of the mass grade 3 IDC with necrosis ER 30% weak, PR 0%, Ki67 60%, HER2 negative   10/22/2023 Cancer Staging   Staging form: Breast, AJCC 8th Edition - Clinical stage from 10/22/2023: Stage IIIA (cT3, cN0, cM0, G3, ER+, PR-, HER2-) - Signed by Cameron Cea, MD on 10/22/2023 Stage prefix: Initial diagnosis Histologic grading system: 3 grade system Laterality: Right Staged by: Pathologist and managing physician Stage used in treatment planning: Yes National guidelines used in treatment planning: Yes Type of national guideline used in treatment planning: NCCN   11/02/2023 Genetic Testing   Negative [facility] Cancer[facility] +RNAinsight Panel.  VUS in BARD1 at p.K438E (c.1312A>G).  Report date is 11/02/2023.   The [facility] CancerNext+RNAinsight Panel includes sequencing, rearrangement analysis, and RNA analysis for the following 40 genes: APC, ATM, BAP1, BARD1, BMPR1A, BRCA1, BRCA2, BRIP1, CDH1, CDKN2A, CHEK2, FH, FLCN, MET, MLH1, MSH2, MSH6, MUTYH, NF1, NTHL1, PALB2, PMS2, PTEN, RAD51C, RAD51D, RSP20, SMAD4, STK11, TP53, TSC1, TSC2, and VHL (sequencing and deletion/duplication); AXIN2, HOXB13, MBD4, MSH3, POLD1 and POLE (sequencing only); EPCAM and GREM1 (deletion/duplication only).   11/06/2023 -  Chemotherapy   Patient is on Treatment Plan : BREAST Pembrolizumab (200) D1 + Carboplatin (1.5)  D1,8,15 + Paclitaxel (80) D1,8,15 q21d X 4 cycles / Pembrolizumab (200) D1 + AC D1 q21d x 4 cycles      FAMILY HISTORY:  We obtained a detailed, 4-generation family history.  Significant diagnoses are listed below:      Family History  Problem Relation Age of Onset   Colon polyps Mother     Colon polyps Sister     Colon polyps Brother         Gabrielle Cross is unaware of previous family history of genetic testing for hereditary cancer risks.    There is no reported Ashkenazi Jewish ancestry. There is no known consanguinity.  GENETIC TEST RESULTS:  The [facility] CancerNext +RNAinsight Panel found no pathogenic mutations.   The [facility] CancerNext+RNAinsight Panel includes sequencing, rearrangement analysis, and RNA analysis for the following 40 genes: APC, ATM, BAP1, BARD1, BMPR1A, BRCA1, BRCA2, BRIP1, CDH1, CDKN2A, CHEK2, FH, FLCN, MET, MLH1, MSH2, MSH6, MUTYH, NF1, NTHL1, PALB2, PMS2, PTEN, RAD51C, RAD51D, RSP20, SMAD4, STK11, TP53, TSC1, TSC2, and VHL (sequencing and deletion/duplication); AXIN2, HOXB13, MBD4, MSH3, POLD1 and POLE (sequencing only); EPCAM and GREM1 (deletion/duplication only).  The test report has been scanned into EPIC and is located under the Molecular Pathology section of the Results Review tab.  A portion of the result report is included below for reference. Genetic testing reported out on November 02, 2023.      Genetic testing did identify a variant of uncertain significance (VUS) in the BARD1 gene called p.K438E (c.1312A>G)  At this time, it is unknown if this variant is associated with increased cancer risk or if this is a normal finding, but most variants such as this get reclassified to being inconsequential. It  should not be used to make medical management decisions. With time, we suspect the lab will determine the significance of this variant, if any. If we do learn more about it, we will try to contact Gabrielle Cross to discuss it further. However, it is important to stay in  touch with us  periodically and keep the address and phone number up to date.   Even though a pathogenic variant was not identified, possible explanations for the cancer in the family may include: There may be no hereditary risk for cancer in the family. The cancers in Gabrielle Cross and/or her family may be sporadic/familial or due to other genetic and environmental factors.  Most cancer is not hereditary.  There may be a gene mutation in one of these genes that current testing methods cannot detect but that chance is small. There could be another gene that has not yet been discovered, or that we have not yet tested, that is responsible for the cancer diagnoses in the family.  It is also possible there is a hereditary cause for the cancer in the family that Gabrielle Cross did not inherit. The variant of uncertain significance detected in the BARD1 gene may be reclassified as a pathogenic variant in the future. At this time, we do not know if this variant increases the risk for cancer.  Therefore, it is important to remain in touch with cancer genetics in the future so that we can continue to offer Gabrielle Cross the most up to date genetic testing.   ADDITIONAL GENETIC TESTING:   Ms. Chihuahua genetic testing was fairly extensive.  If there are additional relevant genes identified to increase cancer risk that can be analyzed in the future, we would be happy to discuss and coordinate this testing at that time.    CANCER SCREENING RECOMMENDATIONS:  Gabrielle Cross test result is considered negative (normal).  This means that we have not identified a hereditary cause for her personal history of breast cancer at this time.    An individual's cancer risk and medical management are not determined by genetic test results alone. Overall cancer risk assessment incorporates additional factors, including personal medical history, family history, and any available genetic information that may result in a personalized  plan for cancer prevention and surveillance. Therefore, it is recommended she continue to follow the cancer management and screening guidelines provided by her oncology and primary healthcare provider.   RECOMMENDATIONS FOR FAMILY MEMBERS:   Since she did not inherit a identifiable mutation in a cancer predisposition gene included on this panel, her children could not have inherited a known mutation from her in one of these genes. Individuals in this family might be at some increased risk of developing cancer, over the general population risk, due to the family history of cancer.  Individuals in the family should notify their providers of the family history of cancer. We recommend women in this family have a yearly mammogram beginning at age 14, or 75 years younger than the earliest onset of cancer, an annual clinical breast exam, and perform monthly breast self-exams.  Risk models that take into account family history and hormonal history may be helpful in determining appropriate breast cancer screening options for family members.   We do not recommend familial testing for the BARD1 variant of uncertain significance (VUS).  FOLLOW-UP:  Cancer genetics is a rapidly advancing field and it is possible that new genetic tests will be appropriate for her and/or her family members in the future. We encourage  Ms. Kunz to remain in contact with cancer genetics, so we can update her personal and family histories and let her know of advances in cancer genetics that may benefit this family.   Our contact number was provided.  They are welcome to call us  at anytime with additional questions or concerns.   Mani Celestin M. Ora Billing, MS, [facility] Genetic Counselor [email or web address] (P) [phone]

## 2023-11-04 NOTE — Telephone Encounter (Signed)
 Notified the pt regarding her FMLA forms being completed, faxed, and confirmation received.No questions or concerns at this time.

## 2023-11-05 ENCOUNTER — Telehealth: Payer: Self-pay | Admitting: *Deleted

## 2023-11-05 ENCOUNTER — Encounter (HOSPITAL_BASED_OUTPATIENT_CLINIC_OR_DEPARTMENT_OTHER)
Admission: RE | Disposition: A | Discharge status: Home / Self Care | Payer: Self-pay | Source: Ambulatory Visit | Attending: General Surgery

## 2023-11-05 ENCOUNTER — Ambulatory Visit (HOSPITAL_BASED_OUTPATIENT_CLINIC_OR_DEPARTMENT_OTHER)
Admission: RE | Admit: 2023-11-05 | Discharge: 2023-11-05 | Disposition: A | Discharge status: Home / Self Care | Source: Ambulatory Visit | Attending: General Surgery | Admitting: General Surgery

## 2023-11-05 ENCOUNTER — Other Ambulatory Visit (HOSPITAL_COMMUNITY): Payer: Self-pay | Admitting: General Surgery

## 2023-11-05 ENCOUNTER — Other Ambulatory Visit: Payer: Self-pay

## 2023-11-05 ENCOUNTER — Encounter (HOSPITAL_BASED_OUTPATIENT_CLINIC_OR_DEPARTMENT_OTHER): Payer: Self-pay | Admitting: General Surgery

## 2023-11-05 ENCOUNTER — Ambulatory Visit (HOSPITAL_COMMUNITY)

## 2023-11-05 ENCOUNTER — Encounter: Payer: Self-pay | Admitting: *Deleted

## 2023-11-05 ENCOUNTER — Ambulatory Visit (HOSPITAL_BASED_OUTPATIENT_CLINIC_OR_DEPARTMENT_OTHER): Payer: Self-pay | Admitting: Anesthesiology

## 2023-11-05 DIAGNOSIS — Z170 Estrogen receptor positive status [ER+]: Secondary | ICD-10-CM

## 2023-11-05 DIAGNOSIS — R519 Headache, unspecified: Secondary | ICD-10-CM | POA: Diagnosis not present

## 2023-11-05 DIAGNOSIS — G4730 Sleep apnea, unspecified: Secondary | ICD-10-CM | POA: Insufficient documentation

## 2023-11-05 DIAGNOSIS — Z5309 Procedure and treatment not carried out because of other contraindication: Secondary | ICD-10-CM | POA: Insufficient documentation

## 2023-11-05 DIAGNOSIS — Z79899 Other long term (current) drug therapy: Secondary | ICD-10-CM | POA: Insufficient documentation

## 2023-11-05 DIAGNOSIS — R0989 Other specified symptoms and signs involving the circulatory and respiratory systems: Secondary | ICD-10-CM | POA: Diagnosis not present

## 2023-11-05 DIAGNOSIS — M1990 Unspecified osteoarthritis, unspecified site: Secondary | ICD-10-CM | POA: Insufficient documentation

## 2023-11-05 DIAGNOSIS — Z1732 Human epidermal growth factor receptor 2 negative status: Secondary | ICD-10-CM | POA: Insufficient documentation

## 2023-11-05 DIAGNOSIS — Z171 Estrogen receptor negative status [ER-]: Secondary | ICD-10-CM | POA: Insufficient documentation

## 2023-11-05 DIAGNOSIS — I10 Essential (primary) hypertension: Secondary | ICD-10-CM | POA: Diagnosis not present

## 2023-11-05 DIAGNOSIS — C50411 Malignant neoplasm of upper-outer quadrant of right female breast: Secondary | ICD-10-CM | POA: Insufficient documentation

## 2023-11-05 DIAGNOSIS — Z1722 Progesterone receptor negative status: Secondary | ICD-10-CM | POA: Diagnosis not present

## 2023-11-05 DIAGNOSIS — Z79890 Hormone replacement therapy (postmenopausal): Secondary | ICD-10-CM | POA: Insufficient documentation

## 2023-11-05 DIAGNOSIS — C50111 Malignant neoplasm of central portion of right female breast: Secondary | ICD-10-CM | POA: Diagnosis not present

## 2023-11-05 DIAGNOSIS — C50911 Malignant neoplasm of unspecified site of right female breast: Secondary | ICD-10-CM | POA: Diagnosis not present

## 2023-11-05 HISTORY — PX: PORTACATH PLACEMENT: SHX2246

## 2023-11-05 SURGERY — INSERTION, TUNNELED CENTRAL VENOUS DEVICE, WITH PORT
Anesthesia: General | Site: Chest | Laterality: Left

## 2023-11-05 MED ORDER — BUPIVACAINE-EPINEPHRINE (PF) 0.25% -1:200000 IJ SOLN
INTRAMUSCULAR | Status: AC
  Filled 2023-11-05: qty 30

## 2023-11-05 MED ORDER — ACETAMINOPHEN 500 MG PO TABS
1000.0000 mg | ORAL_TABLET | Freq: Once | ORAL | Status: AC
  Administered 2023-11-05: 1000 mg via ORAL

## 2023-11-05 MED ORDER — ONDANSETRON HCL 4 MG/2ML IJ SOLN
INTRAMUSCULAR | Status: AC
  Filled 2023-11-05: qty 2

## 2023-11-05 MED ORDER — DROPERIDOL 2.5 MG/ML IJ SOLN: 0.6250 mg | Freq: Once | INTRAMUSCULAR | Status: DC | PRN

## 2023-11-05 MED ORDER — HEPARIN SOD (PORK) LOCK FLUSH 100 UNIT/ML IV SOLN
INTRAVENOUS | Status: AC
  Filled 2023-11-05: qty 5

## 2023-11-05 MED ORDER — LIDOCAINE 2% (20 MG/ML) 5 ML SYRINGE
INTRAMUSCULAR | Status: DC | PRN
  Administered 2023-11-05: 100 mg via INTRAVENOUS

## 2023-11-05 MED ORDER — ACETAMINOPHEN 500 MG PO TABS
ORAL_TABLET | ORAL | Status: AC
  Filled 2023-11-05: qty 2

## 2023-11-05 MED ORDER — HEPARIN (PORCINE) IN NACL 1000-0.9 UT/500ML-% IV SOLN
INTRAVENOUS | Status: AC
  Filled 2023-11-05: qty 500

## 2023-11-05 MED ORDER — FENTANYL CITRATE (PF) 100 MCG/2ML IJ SOLN: 25.0000 ug | INTRAMUSCULAR | Status: DC | PRN

## 2023-11-05 MED ORDER — PROPOFOL 10 MG/ML IV BOLUS
INTRAVENOUS | Status: DC | PRN
  Administered 2023-11-05: 150 mg via INTRAVENOUS
  Administered 2023-11-05: 10 mg via INTRAVENOUS

## 2023-11-05 MED ORDER — MIDAZOLAM HCL 5 MG/5ML IJ SOLN
INTRAMUSCULAR | Status: DC | PRN
  Administered 2023-11-05: 2 mg via INTRAVENOUS

## 2023-11-05 MED ORDER — LACTATED RINGERS IV SOLN: INTRAVENOUS | Status: DC

## 2023-11-05 MED ORDER — ONDANSETRON HCL 4 MG/2ML IJ SOLN
INTRAMUSCULAR | Status: DC | PRN
  Administered 2023-11-05: 4 mg via INTRAVENOUS

## 2023-11-05 MED ORDER — CEFAZOLIN SODIUM-DEXTROSE 2-4 GM/100ML-% IV SOLN
INTRAVENOUS | Status: AC
  Filled 2023-11-05: qty 100

## 2023-11-05 MED ORDER — FENTANYL CITRATE (PF) 100 MCG/2ML IJ SOLN
INTRAMUSCULAR | Status: DC | PRN
  Administered 2023-11-05 (×2): 50 ug via INTRAVENOUS

## 2023-11-05 MED ORDER — DEXAMETHASONE SODIUM PHOSPHATE 4 MG/ML IJ SOLN
INTRAMUSCULAR | Status: DC | PRN
  Administered 2023-11-05: 5 mg via INTRAVENOUS

## 2023-11-05 MED ORDER — OXYCODONE HCL 5 MG PO TABS
5.0000 mg | ORAL_TABLET | Freq: Four times a day (QID) | ORAL | 0 refills | Status: DC | PRN
  Filled 2023-11-05: qty 5, 2d supply, fill #0

## 2023-11-05 MED ORDER — MIDAZOLAM HCL 2 MG/2ML IJ SOLN
INTRAMUSCULAR | Status: AC
Start: 2023-11-05 — End: ?
  Filled 2023-11-05: qty 2

## 2023-11-05 MED ORDER — OXYCODONE HCL 5 MG/5ML PO SOLN: 5.0000 mg | Freq: Once | ORAL | Status: DC | PRN

## 2023-11-05 MED ORDER — LIDOCAINE 2% (20 MG/ML) 5 ML SYRINGE
INTRAMUSCULAR | Status: AC
  Filled 2023-11-05: qty 5

## 2023-11-05 MED ORDER — CEFAZOLIN SODIUM-DEXTROSE 2-4 GM/100ML-% IV SOLN
2.0000 g | INTRAVENOUS | Status: AC
  Administered 2023-11-05: 2 g via INTRAVENOUS

## 2023-11-05 MED ORDER — FENTANYL CITRATE (PF) 100 MCG/2ML IJ SOLN
INTRAMUSCULAR | Status: AC
  Filled 2023-11-05: qty 2

## 2023-11-05 MED ORDER — OXYCODONE HCL 5 MG PO TABS: 5.0000 mg | ORAL_TABLET | Freq: Once | ORAL | Status: DC | PRN

## 2023-11-05 MED ORDER — BUPIVACAINE-EPINEPHRINE 0.25% -1:200000 IJ SOLN
INTRAMUSCULAR | Status: DC | PRN
  Administered 2023-11-05: 7 mL

## 2023-11-05 SURGICAL SUPPLY — 38 items
BAG DECANTER FOR FLEXI CONT (MISCELLANEOUS) ×1 IMPLANT
BLADE SURG 15 STRL LF DISP TIS (BLADE) ×1 IMPLANT
CANISTER SUCT 1200ML W/VALVE (MISCELLANEOUS) IMPLANT
CHLORAPREP W/TINT 26 (MISCELLANEOUS) ×1 IMPLANT
CLEANER CAUTERY TIP PAD (MISCELLANEOUS) ×1 IMPLANT
COVER BACK TABLE 60X90IN (DRAPES) ×1 IMPLANT
COVER MAYO STAND STRL (DRAPES) ×1 IMPLANT
DERMABOND ADVANCED .7 DNX12 (GAUZE/BANDAGES/DRESSINGS) ×1 IMPLANT
DRAPE C-ARM 42X72 X-RAY (DRAPES) ×1 IMPLANT
DRAPE LAPAROSCOPIC ABDOMINAL (DRAPES) ×1 IMPLANT
DRAPE UTILITY XL STRL (DRAPES) ×1 IMPLANT
ELECTRODE REM PT RTRN 9FT ADLT (ELECTROSURGICAL) ×1 IMPLANT
GAUZE 4X4 16PLY ~~LOC~~+RFID DBL (SPONGE) ×1 IMPLANT
GLOVE BIO SURGEON STRL SZ7.5 (GLOVE) ×1 IMPLANT
GLOVE BIOGEL PI IND STRL 6.5 (GLOVE) IMPLANT
GOWN STRL REUS W/ TWL LRG LVL3 (GOWN DISPOSABLE) ×2 IMPLANT
KIT PORT POWER 8FR ISP CVUE (Port) IMPLANT
NDL HYPO 22X1.5 SAFETY MO (MISCELLANEOUS) IMPLANT
NDL HYPO 25X1 1.5 SAFETY (NEEDLE) ×1 IMPLANT
NDL SAFETY ECLIPSE 18X1.5 (NEEDLE) IMPLANT
NDL SPNL 22GX3.5 QUINCKE BK (NEEDLE) IMPLANT
NEEDLE HYPO 22X1.5 SAFETY MO (MISCELLANEOUS) IMPLANT
NEEDLE HYPO 25X1 1.5 SAFETY (NEEDLE) ×1 IMPLANT
NEEDLE SPNL 22GX3.5 QUINCKE BK (NEEDLE) IMPLANT
PACK BASIN DAY SURGERY FS (CUSTOM PROCEDURE TRAY) ×1 IMPLANT
PENCIL SMOKE EVACUATOR (MISCELLANEOUS) ×1 IMPLANT
SLEEVE SCD COMPRESS KNEE MED (STOCKING) IMPLANT
SPIKE FLUID TRANSFER (MISCELLANEOUS) IMPLANT
SUT MON AB 4-0 PC3 18 (SUTURE) ×1 IMPLANT
SUT PROLENE 2 0 SH DA (SUTURE) ×1 IMPLANT
SUT SILK 2 0 TIES 17X18 (SUTURE) IMPLANT
SUT VIC AB 3-0 SH 27X BRD (SUTURE) ×1 IMPLANT
SYR 10ML LL (SYRINGE) IMPLANT
SYR 5ML LL (SYRINGE) ×1 IMPLANT
SYR CONTROL 10ML LL (SYRINGE) ×2 IMPLANT
TOWEL GREEN STERILE FF (TOWEL DISPOSABLE) ×2 IMPLANT
TUBE CONNECTING 20X1/4 (TUBING) IMPLANT
YANKAUER SUCT BULB TIP NO VENT (SUCTIONS) IMPLANT

## 2023-11-05 NOTE — Telephone Encounter (Signed)
 Received call from pt stating surgeon was unsuccessful with port a cath placement today. RN reviewed with MD and verbal orders received to proceed with tx tomorrow through PIV.  Pt scheduled for port placement with IR on 11/10/23.

## 2023-11-05 NOTE — H&P (Signed)
 REFERRING PHYSICIAN: Gudena, Vinay K, MD PROVIDER: Arlester Bence, MD MRN: [id number] DOB: 11-18-1965 Subjective    Chief Complaint: Breast Cancer  History of Present Illness: Gabrielle Cross is a 58 y.o. female who is seen today as an office consultation for evaluation of Breast Cancer  We are asked to see the patient in consultation by Dr. Lee Public to evaluate her for a new right breast cancer. The patient is a 58 year old female who recently went for a routine screening mammogram. It has been several years since she has had a mammogram. She was found to have a 5.1 cm mass in the central portion of the right breast. There was 1 abnormal lymph node which was biopsied and benign. The mass was biopsied and came back as a grade 3 invasive ductal cancer that was weakly ER positive and PR negative and HER2 negative with a Ki-67 of 60%. She does have some hypertension but does not smoke.  Review of Systems: A complete review of systems was obtained from the patient. I have reviewed this information and discussed as appropriate with the patient. See HPI as well for other ROS.  ROS   Medical History: Past Medical History:  Diagnosis Date  Arthritis  Hyperlipidemia   Patient Active Problem List  Diagnosis  Malignant neoplasm of upper-outer quadrant of right breast in female, estrogen receptor positive (CMS/HHS-HCC)  Malignant neoplasm of central portion of right breast in female, estrogen receptor negative (CMS/HHS-HCC)   Past Surgical History:  Procedure Laterality Date  Dilatation and Curettage/hysteroscopy N/A 04/19/2022  .Right Breast Biopsy Right 10/07/2023    Allergies  Allergen Reactions  Shellfish Containing Products Diarrhea   Current Outpatient Medications on File Prior to Visit  Medication Sig Dispense Refill  cholecalciferol  (VITAMIN D3) 2,000 unit tablet Take 2,000 Units by mouth once daily  FLUoxetine  (PROZAC ) 10 MG capsule Take 10 mg by mouth once daily   hydroCHLOROthiazide  (HYDRODIURIL ) 12.5 MG tablet Take 12.5 mg by mouth once daily  progesterone  (PROMETRIUM ) 100 MG capsule Take 100 mg by mouth once daily  rizatriptan  (MAXALT ) 10 MG tablet Take 10 mg by mouth once as needed for Migraine   No current facility-administered medications on file prior to visit.   Family History  Problem Relation Age of Onset  Hyperlipidemia (Elevated cholesterol) Father    Social History   Tobacco Use  Smoking Status Never  Smokeless Tobacco Never    Social History   Socioeconomic History  Marital status: Married  Tobacco Use  Smoking status: Never  Smokeless tobacco: Never  Vaping Use  Vaping status: Never Used  Substance and Sexual Activity  Alcohol use: Yes  Comment: "Socially"  Drug use: Never   Objective:  There were no vitals filed for this visit.  There is no height or weight on file to calculate BMI.  Physical Exam Vitals reviewed.  Constitutional:  General: She is not in acute distress. Appearance: Normal appearance.  HENT:  Head: Normocephalic and atraumatic.  Right Ear: External ear normal.  Left Ear: External ear normal.  Nose: Nose normal.  Mouth/Throat:  Mouth: Mucous membranes are moist.  Pharynx: Oropharynx is clear.  Eyes:  General: No scleral icterus. Extraocular Movements: Extraocular movements intact.  Conjunctiva/sclera: Conjunctivae normal.  Pupils: Pupils are equal, round, and reactive to light.  Cardiovascular:  Rate and Rhythm: Normal rate and regular rhythm.  Pulses: Normal pulses.  Heart sounds: Normal heart sounds.  Pulmonary:  Effort: Pulmonary effort is normal. No respiratory distress.  Breath sounds: Normal breath sounds.  Abdominal:  General: Bowel sounds are normal.  Palpations: Abdomen is soft.  Tenderness: There is no abdominal tenderness.  Musculoskeletal:  General: No swelling, tenderness or deformity. Normal range of motion.  Cervical back: Normal range of motion and neck supple.   Skin: General: Skin is warm and dry.  Coloration: Skin is not jaundiced.  Neurological:  General: No focal deficit present.  Mental Status: She is alert and oriented to person, place, and time.  Psychiatric:  Mood and Affect: Mood normal.  Behavior: Behavior normal.     Breast: There is a 5 cm mobile mass in the central portion of the right breast. There is no palpable mass in the left breast. There is no palpable axillary, supraclavicular, or or cervical lymphadenopathy.  Labs, Imaging and Diagnostic Testing:  Assessment and Plan:   Diagnoses and all orders for this visit:  Malignant neoplasm of central portion of right breast in female, estrogen receptor negative (CMS/HHS-HCC) - [facility] Case Posting Request; Future   The patient appears to have a 5.1 cm mass in the central portion of the right breast with clinically negative nodes and is essentially triple negative. Because of the size and the triple negative nature of the cancer she would likely benefit from neoadjuvant chemotherapy. She will likely need a port for this. I have discussed with her in detail the risks and benefits of the operation to place the port as well as some of the technical aspects including pneumothorax and she understands and wishes to proceed. I will see her periodically while she is getting chemotherapy to track her progress and begins discussing surgical options when she is closer to the end depending on her response.

## 2023-11-05 NOTE — Op Note (Signed)
 11/05/2023  10:59 AM  PATIENT:  Gabrielle Cross  58 y.o. female  PRE-OPERATIVE DIAGNOSIS:  RIGHT BREAST CANCER  POST-OPERATIVE DIAGNOSIS:  RIGHT BREAST CANCER  PROCEDURE:  Procedure(s) with comments: ATTEMPTED INSERTION, TUNNELED CENTRAL VENOUS DEVICE, WITH PORT (Left) - PORT PLACEMENT WITH ULTRASOUND GUIDANCE  SURGEON:  Surgeons and Role:    * Caralyn Chandler, MD - Primary  PHYSICIAN ASSISTANT:   ASSISTANTS: none   ANESTHESIA:   local and general  EBL:  minimal   BLOOD ADMINISTERED:none  DRAINS: none   LOCAL MEDICATIONS USED:  MARCAINE     SPECIMEN:  No Specimen  DISPOSITION OF SPECIMEN:  N/A  COUNTS:  YES  TOURNIQUET:  * No tourniquets in log *  DICTATION: .Dragon Dictation  After informed consent was obtained the patient was brought to the operating room and placed in the supine position on the operating table.  After adequate induction of general anesthesia a roll was placed between the patient's shoulder blades to extend the shoulders slightly.  The left chest and neck area were then prepped with ChloraPrep, allowed to dry, and draped in usual sterile manner.  An appropriate timeout was performed.  The area lateral to the bend of the clavicle on the left chest wall was infiltrated with quarter percent Marcaine.  A large bore needle from the Port-A-Cath kit was used to slide beneath the bend of the clavicle heading towards the sternal notch.  I could not identify an open space below the clavicle.  Because of this I elected to move up to the neck.  I used the ultrasound machine to identify the left internal jugular vein.  I was able to access the left internal jugular vein with the large-bore needle from the Port-A-Cath kit under ultrasound guidance.  I had good aspiration of blood.  The wire was then fed through the needle using the Seldinger technique.  I could not get the wire to feed.  After visualizing the needle in the internal jugular vein on to get occasions and not  being able to get the wire to feed I elected to stop the operation at this point.  Pressure was held for several minutes until the needlestick sites were hemostatic.  Band-Aids were then placed at each site.  The patient tolerated the procedure well.  At the end of the case all needle sponge and instrument counts were correct.  The patient was then awakened and taken to recovery in stable condition.  We will arrange for interventional radiology to try to place the port in the next few days.  PLAN OF CARE: Discharge to home after PACU  PATIENT DISPOSITION:  PACU - hemodynamically stable.   Delay start of Pharmacological VTE agent (>24hrs) due to surgical blood loss or risk of bleeding: not applicable

## 2023-11-05 NOTE — Anesthesia Procedure Notes (Signed)
 Procedure Name: LMA Insertion Date/Time: 11/05/2023 10:02 AM  Performed by: Steffani Edman, CRNAPre-anesthesia Checklist: Patient identified, Emergency Drugs available, Suction available and Patient being monitored Patient Re-evaluated:Patient Re-evaluated prior to induction Oxygen Delivery Method: Circle System Utilized Preoxygenation: Pre-oxygenation with 100% oxygen Induction Type: IV induction Ventilation: Mask ventilation without difficulty LMA: LMA inserted LMA Size: 4.0 Number of attempts: 1 Airway Equipment and Method: Bite block Placement Confirmation: positive ETCO2 Tube secured with: Tape Dental Injury: Teeth and Oropharynx as per pre-operative assessment

## 2023-11-05 NOTE — Transfer of Care (Addendum)
 Immediate Anesthesia Transfer of Care Note  Patient: Gabrielle Cross  Procedure(s) Performed: ATTEMPTED INSERTION, TUNNELED CENTRAL VENOUS DEVICE, WITH PORT (Left: Chest)  Patient Location: PACU  Anesthesia Type:General  Level of Consciousness: awake, alert , and oriented  Airway & Oxygen Therapy: Patient Spontanous Breathing and Patient connected to face mask oxygen  Post-op Assessment: Report given to RN and Post -op Vital signs reviewed and stable  Post vital signs: Reviewed and stable  Last Vitals:  Vitals Value Taken Time  BP 126/77 (92)   Temp    Pulse 85 11/05/23 1102  Resp 16   SpO2 99 % 11/05/23 1102  Vitals shown include unfiled device data.  Last Pain:  Vitals:   11/05/23 0846  TempSrc: Temporal  PainSc: 0-No pain      Patients Stated Pain Goal: 3 (11/05/23 0846)  Complications: No notable events documented.

## 2023-11-05 NOTE — Anesthesia Postprocedure Evaluation (Signed)
 Anesthesia Post Note  Patient: Gabrielle Cross  Procedure(s) Performed: ATTEMPTED INSERTION, TUNNELED CENTRAL VENOUS DEVICE, WITH PORT (Left: Chest)     Patient location during evaluation: PACU Anesthesia Type: General Level of consciousness: awake and alert Pain management: pain level controlled Vital Signs Assessment: post-procedure vital signs reviewed and stable Respiratory status: spontaneous breathing, nonlabored ventilation and respiratory function stable Cardiovascular status: blood pressure returned to baseline Postop Assessment: no apparent nausea or vomiting Anesthetic complications: no   No notable events documented.  Last Vitals:  Vitals:   11/05/23 1130 11/05/23 1145  BP: 124/83 124/82  Pulse: 78 75  Resp: 15 13  Temp:    SpO2: 95% 97%    Last Pain:  Vitals:   11/05/23 1145  TempSrc:   PainSc: 0-No pain                 Rayfield Cairo

## 2023-11-05 NOTE — Interval H&P Note (Signed)
 History and Physical Interval Note:  11/05/2023 9:31 AM  Gabrielle Cross  has presented today for surgery, with the diagnosis of RIGHT BREAST CANCER.  The various methods of treatment have been discussed with the patient and family. After consideration of risks, benefits and other options for treatment, the patient has consented to  Procedure(s) with comments: INSERTION, TUNNELED CENTRAL VENOUS DEVICE, WITH PORT (N/A) - PORT PLACEMENT WITH ULTRASOUND GUIDANCE as a surgical intervention.  The patient's history has been reviewed, patient examined, no change in status, stable for surgery.  I have reviewed the patient's chart and labs.  Questions were answered to the patient's satisfaction.     Lillette Reid III

## 2023-11-05 NOTE — Discharge Instructions (Addendum)
  Post Anesthesia Home Care Instructions  Activity: Get plenty of rest for the remainder of the day. A responsible individual must stay with you for 24 hours following the procedure.  For the next 24 hours, DO NOT: -Drive a car -Advertising copywriter -Drink alcoholic beverages -Take any medication unless instructed by your physician -Make any legal decisions or sign important papers.  Meals: Start with liquid foods such as gelatin or soup. Progress to regular foods as tolerated. Avoid greasy, spicy, heavy foods. If nausea and/or vomiting occur, drink only clear liquids until the nausea and/or vomiting subsides. Call your physician if vomiting continues.  Special Instructions/Symptoms: Your throat may feel dry or sore from the anesthesia or the breathing tube placed in your throat during surgery. If this causes discomfort, gargle with warm salt water. The discomfort should disappear within 24 hours.  If you had a scopolamine  patch placed behind your ear for the management of post- operative nausea and/or vomiting:  1. The medication in the patch is effective for 72 hours, after which it should be removed.  Wrap patch in a tissue and discard in the trash. Wash hands thoroughly with soap and water. 2. You may remove the patch earlier than 72 hours if you experience unpleasant side effects which may include dry mouth, dizziness or visual disturbances. 3. Avoid touching the patch. Wash your hands with soap and water after contact with the patch.     Next dose of Tylenol may be given at 2:50pm if needed.

## 2023-11-06 ENCOUNTER — Inpatient Hospital Stay

## 2023-11-06 ENCOUNTER — Encounter (HOSPITAL_BASED_OUTPATIENT_CLINIC_OR_DEPARTMENT_OTHER): Payer: Self-pay | Admitting: General Surgery

## 2023-11-06 ENCOUNTER — Encounter: Payer: Self-pay | Admitting: Hematology and Oncology

## 2023-11-06 ENCOUNTER — Other Ambulatory Visit: Payer: Self-pay | Admitting: Diagnostic Radiology

## 2023-11-06 VITALS — BP 130/76 | HR 71 | Temp 97.9°F | Resp 16 | Wt 181.2 lb

## 2023-11-06 DIAGNOSIS — C50411 Malignant neoplasm of upper-outer quadrant of right female breast: Secondary | ICD-10-CM | POA: Diagnosis not present

## 2023-11-06 DIAGNOSIS — Z170 Estrogen receptor positive status [ER+]: Secondary | ICD-10-CM

## 2023-11-06 DIAGNOSIS — Z1722 Progesterone receptor negative status: Secondary | ICD-10-CM | POA: Diagnosis not present

## 2023-11-06 DIAGNOSIS — Z5111 Encounter for antineoplastic chemotherapy: Secondary | ICD-10-CM | POA: Diagnosis not present

## 2023-11-06 DIAGNOSIS — Z5112 Encounter for antineoplastic immunotherapy: Secondary | ICD-10-CM | POA: Diagnosis not present

## 2023-11-06 DIAGNOSIS — Z1732 Human epidermal growth factor receptor 2 negative status: Secondary | ICD-10-CM | POA: Diagnosis not present

## 2023-11-06 DIAGNOSIS — Z79620 Long term (current) use of immunosuppressive biologic: Secondary | ICD-10-CM | POA: Diagnosis not present

## 2023-11-06 LAB — CMP (CANCER CENTER ONLY)
ALT: 29 U/L (ref 0–44)
AST: 19 U/L (ref 15–41)
Albumin: 4.5 g/dL (ref 3.5–5.0)
Alkaline Phosphatase: 73 U/L (ref 38–126)
Anion gap: 6 (ref 5–15)
BUN: 12 mg/dL (ref 6–20)
CO2: 29 mmol/L (ref 22–32)
Calcium: 9.3 mg/dL (ref 8.9–10.3)
Chloride: 106 mmol/L (ref 98–111)
Creatinine: 0.73 mg/dL (ref 0.44–1.00)
GFR, Estimated: >60 mL/min (ref >60)
Glucose, Bld: 90 mg/dL (ref 70–99)
Potassium: 3.7 mmol/L (ref 3.5–5.1)
Sodium: 141 mmol/L (ref 135–145)
Total Bilirubin: 0.3 mg/dL (ref 0.0–1.2)
Total Protein: 7.7 g/dL (ref 6.5–8.1)

## 2023-11-06 LAB — CBC WITH DIFFERENTIAL (CANCER CENTER ONLY)
Abs Immature Granulocytes: 0.12 10*3/uL — ABNORMAL HIGH (ref 0.00–0.07)
Basophils Absolute: 0 10*3/uL (ref 0.0–0.1)
Basophils Relative: 0 %
Eosinophils Absolute: 0 10*3/uL (ref 0.0–0.5)
Eosinophils Relative: 0 %
HCT: 42.8 % (ref 36.0–46.0)
Hemoglobin: 14.1 g/dL (ref 12.0–15.0)
Immature Granulocytes: 1 %
Lymphocytes Relative: 33 %
Lymphs Abs: 3.2 10*3/uL (ref 0.7–4.0)
MCH: 27.5 pg (ref 26.0–34.0)
MCHC: 32.9 g/dL (ref 30.0–36.0)
MCV: 83.4 fL (ref 80.0–100.0)
Monocytes Absolute: 0.7 10*3/uL (ref 0.1–1.0)
Monocytes Relative: 7 %
Neutro Abs: 5.7 10*3/uL (ref 1.7–7.7)
Neutrophils Relative %: 59 %
Platelet Count: 332 10*3/uL (ref 150–400)
RBC: 5.13 MIL/uL — ABNORMAL HIGH (ref 3.87–5.11)
RDW: 14.1 % (ref 11.5–15.5)
WBC Count: 9.7 10*3/uL (ref 4.0–10.5)
nRBC: 0 % (ref 0.0–0.2)

## 2023-11-06 LAB — TSH: TSH: 0.531 u[IU]/mL (ref 0.350–4.500)

## 2023-11-06 LAB — RESEARCH LABS

## 2023-11-06 MED ORDER — SODIUM CHLORIDE 0.9 % IV SOLN
194.8500 mg | Freq: Once | INTRAVENOUS | Status: AC
  Administered 2023-11-06: 190 mg via INTRAVENOUS
  Filled 2023-11-06: qty 19

## 2023-11-06 MED ORDER — FAMOTIDINE IN NACL 20-0.9 MG/50ML-% IV SOLN
20.0000 mg | Freq: Once | INTRAVENOUS | Status: AC
  Administered 2023-11-06: 20 mg via INTRAVENOUS
  Filled 2023-11-06: qty 50

## 2023-11-06 MED ORDER — PALONOSETRON HCL INJECTION 0.25 MG/5ML
0.2500 mg | Freq: Once | INTRAVENOUS | Status: AC
  Administered 2023-11-06: 0.25 mg via INTRAVENOUS
  Filled 2023-11-06: qty 5

## 2023-11-06 MED ORDER — DEXAMETHASONE SODIUM PHOSPHATE 10 MG/ML IJ SOLN
10.0000 mg | Freq: Once | INTRAMUSCULAR | Status: AC
  Administered 2023-11-06: 10 mg via INTRAVENOUS
  Filled 2023-11-06: qty 1

## 2023-11-06 MED ORDER — DIPHENHYDRAMINE HCL 50 MG/ML IJ SOLN
50.0000 mg | Freq: Once | INTRAMUSCULAR | Status: AC
  Administered 2023-11-06: 50 mg via INTRAVENOUS
  Filled 2023-11-06: qty 1

## 2023-11-06 MED ORDER — SODIUM CHLORIDE 0.9 % IV SOLN
80.0000 mg/m2 | Freq: Once | INTRAVENOUS | Status: AC
  Administered 2023-11-06: 150 mg via INTRAVENOUS
  Filled 2023-11-06: qty 25

## 2023-11-06 MED ORDER — SODIUM CHLORIDE 0.9 % IV SOLN
200.0000 mg | Freq: Once | INTRAVENOUS | Status: AC
  Administered 2023-11-06: 200 mg via INTRAVENOUS
  Filled 2023-11-06: qty 200

## 2023-11-06 MED ORDER — SODIUM CHLORIDE 0.9 % IV SOLN: INTRAVENOUS | Status: DC

## 2023-11-06 NOTE — Research (Signed)
 F6213, ICE COMPRESS: RANDOMIZED TRIAL OF LIMB CRYOCOMPRESSION VERSUS CONTINUOUS COMPRESSION VERSUS LOW CYCLIC COMPRESSION FOR THE PREVENTION  OF TAXANE-INDUCED PERIPHERAL NEUROPATHY   Cycle 1: Patient arrives today Accompanied by her husband for the cycle 1 visit. Confirmed patient does not have wounds, sores, or lesions to extremities. Patient has not had any vaccinations since last visit.    LABS: Optional labs are collected per consent and study protocol by fresh venipuncture. [name] tolerated well without complaint.   ADVERSE EVENTS: Patient reports no changes since last consent visit. Adverse Event CTCAE Grade Onset date Resolved date Relationship to Study Intervention Action Taken Comments  Skin atrophy (solicited) 0   NA    Skin hyperpigmentation (solicited) 0   NA    Skin hypopigmentation (solicited) 0   NA    Skin induration (solicited) 0   NA    Skin ulceration (solicited) 0   NA    Rash maculopapular (solicited) 0   NA    Nail changes (solicited) 0   NA    Cold intolerance (solicited) (general disorders and administration site conditions- other) 0   NA    Frostbite (solicited) (skin and subcutaneous tissue disorders- other) 0   NA     STUDY INTERVENTION & TOLERABILITY ASSESSMENTS: 12:36 PM Wraps applied; pre-treatment started. Pt had a peripheral IV placed in the right forearm. One single wrap flap was applied, covering the fingertips to wrist on the right arm.  12:45 PM Study-required Tolerability check (5-15 min): Patient reported compression as tolerable. 1:19 PM Taxane treatment started. Device transitioned to treatment phase. 1:36 PM Tolerability check performed 1:40 PM Device interrupted for patient bathroom break. 1:48 PM Wraps re-applied and device restarted.  2:36 PM Tolerability check performed 2:51 PM Taxane infusion completed. Device moved to post-treatment phase. 3:47 PM Wraps removed. Pt confirmed no new wounds, sores, or lesions.  Was the  treatment paused for a bathroom break? Yes For how long? 8 minutes Was this time made up prior to treatment completion? Yes  DISPOSITION: Upon completion off all study requirements, patient remained in the infusion chair with Nellie Banas, Charity fundraiser, at bedside. Her next treatment is scheduled for 11/13/23. Pt has no questions or concerns at this time  Patient was thanked for her time and her voluntary participation in this study. [name] has been provided direct contact information and is encouraged to contact this Coordinator for any needs or questions.  Antoinetta Berrones, Ph.D. Clinical Research Coordinator [phone] 11/06/2023 4:46 PM

## 2023-11-06 NOTE — Patient Instructions (Signed)
 [facility] - A DEPT OF [facility]  [facility]  Discharge Instructions: Thank you for choosing [facility] to provide your oncology and hematology care.   If you have a lab appointment with the Cancer Center, please go directly to the Cancer Center and check in at the registration area.   Wear comfortable clothing and clothing appropriate for easy access to any Portacath or PICC line.   We strive to give you quality time with your provider. You may need to reschedule your appointment if you arrive late (15 or more minutes).  Arriving late affects you and other patients whose appointments are after yours.  Also, if you miss three or more appointments without notifying the office, you may be dismissed from the clinic at the provider's discretion.      For prescription refill requests, have your pharmacy contact our office and allow 72 hours for refills to be completed.    Today you received the following chemotherapy and/or immunotherapy agents: Keytruda, Paclitaxel, carboplatin      To help prevent nausea and vomiting after your treatment, we encourage you to take your nausea medication as directed.  BELOW ARE SYMPTOMS THAT SHOULD BE REPORTED IMMEDIATELY: *FEVER GREATER THAN 100.4 F (38 C) OR HIGHER *CHILLS OR SWEATING *NAUSEA AND VOMITING THAT IS NOT CONTROLLED WITH YOUR NAUSEA MEDICATION *UNUSUAL SHORTNESS OF BREATH *UNUSUAL BRUISING OR BLEEDING *URINARY PROBLEMS (pain or burning when urinating, or frequent urination) *BOWEL PROBLEMS (unusual diarrhea, constipation, pain near the anus) TENDERNESS IN MOUTH AND THROAT WITH OR WITHOUT PRESENCE OF ULCERS (sore throat, sores in mouth, or a toothache) UNUSUAL RASH, SWELLING OR PAIN  UNUSUAL VAGINAL DISCHARGE OR ITCHING   Items with * indicate a potential emergency and should be followed up as soon as possible or go to the Emergency Department if any problems should occur.  Please show the CHEMOTHERAPY  ALERT CARD or IMMUNOTHERAPY ALERT CARD at check-in to the Emergency Department and triage nurse.  Should you have questions after your visit or need to cancel or reschedule your appointment, please contact [facility] - A DEPT OF [facility]  Dept: [phone]  and follow the prompts.  Office hours are 8:00 a.m. to 4:30 p.m. Monday - Friday. Please note that voicemails left after 4:00 p.m. may not be returned until the following business day.  We are closed weekends and major holidays. You have access to a nurse at all times for urgent questions. Please call the main number to the clinic Dept: [phone] and follow the prompts.   For any non-urgent questions, you may also contact your provider using MyChart. We now offer e-Visits for anyone 79 and older to request care online for non-urgent symptoms. For details visit [email or web address].   Also download the MyChart app! Go to the app store, search "MyChart", open the app, select [facility], and log in with your MyChart username and password.  Pembrolizumab Injection What is this medication? PEMBROLIZUMAB (PEM broe LIZ ue mab) treats some types of cancer. It works by helping your immune system slow or stop the spread of cancer cells. It is a monoclonal antibody. This medicine may be used for other purposes; ask your health care provider or pharmacist if you have questions. COMMON BRAND NAME(S): Keytruda What should I tell my care team before I take this medication? They need to know if you have any of these conditions: Allogeneic stem cell transplant (uses someone else's  stem cells) Autoimmune diseases, such as Crohn disease, ulcerative colitis, lupus History of chest radiation Nervous system problems, such as Guillain-Barre syndrome, myasthenia gravis Organ transplant An unusual or allergic reaction to pembrolizumab, other medications, foods, dyes, or preservatives Pregnant or trying to get  pregnant Breast-feeding How should I use this medication? This medication is injected into a vein. It is given by your care team in a hospital or clinic setting. A special MedGuide will be given to you before each treatment. Be sure to read this information carefully each time. Talk to your care team about the use of this medication in children. While it may be prescribed for children as young as 6 months for selected conditions, precautions do apply. Overdosage: If you think you have taken too much of this medicine contact a poison control center or emergency room at once. NOTE: This medicine is only for you. Do not share this medicine with others. What if I miss a dose? Keep appointments for follow-up doses. It is important not to miss your dose. Call your care team if you are unable to keep an appointment. What may interact with this medication? Interactions have not been studied. This list may not describe all possible interactions. Give your health care provider a list of all the medicines, herbs, non-prescription drugs, or dietary supplements you use. Also tell them if you smoke, drink alcohol, or use illegal drugs. Some items may interact with your medicine. What should I watch for while using this medication? Your condition will be monitored carefully while you are receiving this medication. You may need blood work while taking this medication. This medication may cause serious skin reactions. They can happen weeks to months after starting the medication. Contact your care team right away if you notice fevers or flu-like symptoms with a rash. The rash may be red or purple and then turn into blisters or peeling of the skin. You may also notice a red rash with swelling of the face, lips, or lymph nodes in your neck or under your arms. Tell your care team right away if you have any change in your eyesight. Talk to your care team if you may be pregnant. Serious birth defects can occur if you  take this medication during pregnancy and for 4 months after the last dose. You will need a negative pregnancy test before starting this medication. Contraception is recommended while taking this medication and for 4 months after the last dose. Your care team can help you find the option that works for you. Do not breastfeed while taking this medication and for 4 months after the last dose. What side effects may I notice from receiving this medication? Side effects that you should report to your care team as soon as possible: Allergic reactions--skin rash, itching, hives, swelling of the face, lips, tongue, or throat Dry cough, shortness of breath or trouble breathing Eye pain, redness, irritation, or discharge with blurry or decreased vision Heart muscle inflammation--unusual weakness or fatigue, shortness of breath, chest pain, fast or irregular heartbeat, dizziness, swelling of the ankles, feet, or hands Hormone gland problems--headache, sensitivity to light, unusual weakness or fatigue, dizziness, fast or irregular heartbeat, increased sensitivity to cold or heat, excessive sweating, constipation, hair loss, increased thirst or amount of urine, tremors or shaking, irritability Infusion reactions--chest pain, shortness of breath or trouble breathing, feeling faint or lightheaded Kidney injury (glomerulonephritis)--decrease in the amount of urine, red or dark brown urine, foamy or bubbly urine, swelling of the ankles, hands,  or feet Liver injury--right upper belly pain, loss of appetite, nausea, light-colored stool, dark yellow or brown urine, yellowing skin or eyes, unusual weakness or fatigue Pain, tingling, or numbness in the hands or feet, muscle weakness, change in vision, confusion or trouble speaking, loss of balance or coordination, trouble walking, seizures Rash, fever, and swollen lymph nodes Redness, blistering, peeling, or loosening of the skin, including inside the mouth Sudden or  severe stomach pain, bloody diarrhea, fever, nausea, vomiting Side effects that usually do not require medical attention (report to your care team if they continue or are bothersome): Bone, joint, or muscle pain Diarrhea Fatigue Loss of appetite Nausea Skin rash This list may not describe all possible side effects. Call your doctor for medical advice about side effects. You may report side effects to FDA at 1-800-FDA-1088. Where should I keep my medication? This medication is given in a hospital or clinic. It will not be stored at home. NOTE: This sheet is a summary. It may not cover all possible information. If you have questions about this medicine, talk to your doctor, pharmacist, or health care provider.  2024 Elsevier/Gold Standard (2021-10-02 00:00:00) Paclitaxel Injection What is this medication? PACLITAXEL (PAK li TAX el) treats some types of cancer. It works by slowing down the growth of cancer cells. This medicine may be used for other purposes; ask your health care provider or pharmacist if you have questions. COMMON BRAND NAME(S): Onxol, Taxol What should I tell my care team before I take this medication? They need to know if you have any of these conditions: Heart disease Liver disease Low white blood cell levels An unusual or allergic reaction to paclitaxel, other medications, foods, dyes, or preservatives If you or your partner are pregnant or trying to get pregnant Breast-feeding How should I use this medication? This medication is injected into a vein. It is given by your care team in a hospital or clinic setting. Talk to your care team about the use of this medication in children. While it may be given to children for selected conditions, precautions do apply. Overdosage: If you think you have taken too much of this medicine contact a poison control center or emergency room at once. NOTE: This medicine is only for you. Do not share this medicine with others. What if  I miss a dose? Keep appointments for follow-up doses. It is important not to miss your dose. Call your care team if you are unable to keep an appointment. What may interact with this medication? Do not take this medication with any of the following: Live virus vaccines Other medications may affect the way this medication works. Talk with your care team about all of the medications you take. They may suggest changes to your treatment plan to lower the risk of side effects and to make sure your medications work as intended. This list may not describe all possible interactions. Give your health care provider a list of all the medicines, herbs, non-prescription drugs, or dietary supplements you use. Also tell them if you smoke, drink alcohol, or use illegal drugs. Some items may interact with your medicine. What should I watch for while using this medication? Your condition will be monitored carefully while you are receiving this medication. You may need blood work while taking this medication. This medication may make you feel generally unwell. This is not uncommon as chemotherapy can affect healthy cells as well as cancer cells. Report any side effects. Continue your course of treatment even though  you feel ill unless your care team tells you to stop. This medication can cause serious allergic reactions. To reduce the risk, your care team may give you other medications to take before receiving this one. Be sure to follow the directions from your care team. This medication may increase your risk of getting an infection. Call your care team for advice if you get a fever, chills, sore throat, or other symptoms of a cold or flu. Do not treat yourself. Try to avoid being around people who are sick. This medication may increase your risk to bruise or bleed. Call your care team if you notice any unusual bleeding. Be careful brushing or flossing your teeth or using a toothpick because you may get an infection or  bleed more easily. If you have any dental work done, tell your dentist you are receiving this medication. Talk to your care team if you may be pregnant. Serious birth defects can occur if you take this medication during pregnancy. Talk to your care team before breastfeeding. Changes to your treatment plan may be needed. What side effects may I notice from receiving this medication? Side effects that you should report to your care team as soon as possible: Allergic reactions--skin rash, itching, hives, swelling of the face, lips, tongue, or throat Heart rhythm changes--fast or irregular heartbeat, dizziness, feeling faint or lightheaded, chest pain, trouble breathing Increase in blood pressure Infection--fever, chills, cough, sore throat, wounds that don't heal, pain or trouble when passing urine, general feeling of discomfort or being unwell Low blood pressure--dizziness, feeling faint or lightheaded, blurry vision Low red blood cell level--unusual weakness or fatigue, dizziness, headache, trouble breathing Painful swelling, warmth, or redness of the skin, blisters or sores at the infusion site Pain, tingling, or numbness in the hands or feet Slow heartbeat--dizziness, feeling faint or lightheaded, confusion, trouble breathing, unusual weakness or fatigue Unusual bruising or bleeding Side effects that usually do not require medical attention (report to your care team if they continue or are bothersome): Diarrhea Hair loss Joint pain Loss of appetite Muscle pain Nausea Vomiting This list may not describe all possible side effects. Call your doctor for medical advice about side effects. You may report side effects to FDA at 1-800-FDA-1088. Where should I keep my medication? This medication is given in a hospital or clinic. It will not be stored at home. NOTE: This sheet is a summary. It may not cover all possible information. If you have questions about this medicine, talk to your doctor,  pharmacist, or health care provider.  2024 Elsevier/Gold Standard (2021-10-09 00:00:00) Carboplatin Injection What is this medication? CARBOPLATIN (KAR boe pla tin) treats some types of cancer. It works by slowing down the growth of cancer cells. This medicine may be used for other purposes; ask your health care provider or pharmacist if you have questions. COMMON BRAND NAME(S): Paraplatin What should I tell my care team before I take this medication? They need to know if you have any of these conditions: Blood disorders Hearing problems Kidney disease Recent or ongoing radiation therapy An unusual or allergic reaction to carboplatin, cisplatin, other medications, foods, dyes, or preservatives Pregnant or trying to get pregnant Breast-feeding How should I use this medication? This medication is injected into a vein. It is given by your care team in a hospital or clinic setting. Talk to your care team about the use of this medication in children. Special care may be needed. Overdosage: If you think you have taken too much of  this medicine contact a poison control center or emergency room at once. NOTE: This medicine is only for you. Do not share this medicine with others. What if I miss a dose? Keep appointments for follow-up doses. It is important not to miss your dose. Call your care team if you are unable to keep an appointment. What may interact with this medication? Medications for seizures Some antibiotics, such as amikacin, gentamicin, neomycin, streptomycin, tobramycin Vaccines This list may not describe all possible interactions. Give your health care provider a list of all the medicines, herbs, non-prescription drugs, or dietary supplements you use. Also tell them if you smoke, drink alcohol, or use illegal drugs. Some items may interact with your medicine. What should I watch for while using this medication? Your condition will be monitored carefully while you are receiving  this medication. You may need blood work while taking this medication. This medication may make you feel generally unwell. This is not uncommon, as chemotherapy can affect healthy cells as well as cancer cells. Report any side effects. Continue your course of treatment even though you feel ill unless your care team tells you to stop. In some cases, you may be given additional medications to help with side effects. Follow all directions for their use. This medication may increase your risk of getting an infection. Call your care team for advice if you get a fever, chills, sore throat, or other symptoms of a cold or flu. Do not treat yourself. Try to avoid being around people who are sick. Avoid taking medications that contain aspirin, acetaminophen, ibuprofen, naproxen, or ketoprofen unless instructed by your care team. These medications may hide a fever. Be careful brushing or flossing your teeth or using a toothpick because you may get an infection or bleed more easily. If you have any dental work done, tell your dentist you are receiving this medication. Talk to your care team if you wish to become pregnant or think you might be pregnant. This medication can cause serious birth defects. Talk to your care team about effective forms of contraception. Do not breast-feed while taking this medication. What side effects may I notice from receiving this medication? Side effects that you should report to your care team as soon as possible: Allergic reactions--skin rash, itching, hives, swelling of the face, lips, tongue, or throat Infection--fever, chills, cough, sore throat, wounds that don't heal, pain or trouble when passing urine, general feeling of discomfort or being unwell Low red blood cell level--unusual weakness or fatigue, dizziness, headache, trouble breathing Pain, tingling, or numbness in the hands or feet, muscle weakness, change in vision, confusion or trouble speaking, loss of balance or  coordination, trouble walking, seizures Unusual bruising or bleeding Side effects that usually do not require medical attention (report to your care team if they continue or are bothersome): Hair loss Nausea Unusual weakness or fatigue Vomiting This list may not describe all possible side effects. Call your doctor for medical advice about side effects. You may report side effects to FDA at 1-800-FDA-1088. Where should I keep my medication? This medication is given in a hospital or clinic. It will not be stored at home. NOTE: This sheet is a summary. It may not cover all possible information. If you have questions about this medicine, talk to your doctor, pharmacist, or health care provider.  2024 Elsevier/Gold Standard (2021-09-11 00:00:00)

## 2023-11-07 ENCOUNTER — Telehealth: Payer: Self-pay | Admitting: *Deleted

## 2023-11-07 ENCOUNTER — Other Ambulatory Visit (HOSPITAL_COMMUNITY): Payer: Self-pay

## 2023-11-07 ENCOUNTER — Encounter: Payer: Self-pay | Admitting: Hematology and Oncology

## 2023-11-07 LAB — T4: T4, Total: 7 ug/dL (ref 4.5–12.0)

## 2023-11-07 NOTE — Telephone Encounter (Signed)
 RN placed call to pt for post chemo f/u.  Pt states she is feeling very well and is not experiencing any side effects.  RN reviewed antiemetic regime and encouraged pt to continue with increased p.o intake and to contact our office if any symptoms develop.  Pt verbalized understanding.

## 2023-11-08 ENCOUNTER — Other Ambulatory Visit (HOSPITAL_COMMUNITY): Payer: Self-pay

## 2023-11-08 NOTE — H&P (Signed)
 Chief Complaint: Right breast cancer; chemotherapy initiation - image guided port a catheter placement   Referring Provider(s): Lillette Reid   Supervising Physician: Elene Griffes  Patient Status: [facility] - Out-pt  History of Present Illness: Gabrielle Cross is a 58 y.o. female with history of arthritis, elevated hemoglobin, hypertension, migraines, obstructive sleep apnea, ruptured ectopic pregnancy, recently diagnosed right breast cancer currently undergoing chemotherapy treatment.  Patient is followed by Dr. Lee Public of oncology who referred her to Dr. Alethea Andes of general surgery for cath placement.  Patient underwent surgery for attempted Port-A-Cath placement on 11/05/2023 where he was unable to get the wire to feed through the internal jugular vein.  Dr. Alethea Andes referred the patient to interventional radiology for the second attempt of Port-A-Cath placement.  *** Patient is Full Code  Past Medical History:  Diagnosis Date   Arthritis    knees   Blood transfusion without reported diagnosis 2002   Carpal tunnel syndrome, bilateral    Elevated hemoglobin (HCC)    Hypertension    Incompetent cervix    DELIVERED AT 31 WEEKS   Migraines    OSA (obstructive sleep apnea)    Rx for autopap, does not use   Ruptured ectopic pregnancy 2002   WITH BLOOD TRANSFUSION 2U'S PRBC'S   Wears glasses     Past Surgical History:  Procedure Laterality Date   BREAST BIOPSY Right 10/07/2023   US  RT BREAST BX W LOC DEV 1ST LESION IMG BX SPEC US  GUIDE 10/07/2023 [facility]   CERCLAGE PLACEMENT     DILATATION & CURETTAGE/HYSTEROSCOPY WITH MYOSURE N/A 04/19/2022   Procedure: DILATATION & CURETTAGE/HYSTEROSCOPY WITH MYOSURE;  Surgeon: Lavoie, Marie-Lyne, MD;  Location: [facility];  Service: Gynecology;  Laterality: N/A;   PORTACATH PLACEMENT Left 11/05/2023   Procedure: ATTEMPTED INSERTION, TUNNELED CENTRAL VENOUS DEVICE, WITH PORT;  Surgeon: Caralyn Chandler, MD;  Location: [facility];  Service: General;  Laterality: Left;  PORT PLACEMENT WITH ULTRASOUND GUIDANCE   RESECTOSCOPIC POLYPECTOMY  2001   SALPINGECTOMY Right    RIGHT, RUPTURED ECTOPIC   SKIN CANCER EXCISION  2020   tangential bx  02/22/2019    Allergies: Shellfish allergy  Medications: Prior to Admission medications   Medication Sig Start Date End Date Taking? Authorizing Provider  amoxicillin (AMOXIL) 250 MG capsule Take 250 mg by mouth 3 (three) times daily.    [provider]  dexamethasone  (DECADRON ) 4 MG tablet With Adriamycin Cytoxan take 1 tablet day after chemo and 1 tablet 2 days after chemo with food 10/22/23   Gudena, Vinay, MD  hydrochlorothiazide  (HYDRODIURIL ) 12.5 MG tablet Take 1 tablet (12.5 mg total) by mouth daily. 05/08/23   Chrzanowski, Jami B, NP  imipramine  (TOFRANIL ) 25 MG tablet Take 3 tablets (75 mg total) by mouth daily. 07/01/23     lidocaine -prilocaine  (EMLA ) cream Apply to affected area once 10/22/23   Gudena, Vinay, MD  ondansetron  (ZOFRAN ) 8 MG tablet Take 1 tablet (8 mg total) by mouth every 8 (eight) hours as needed for nausea or vomiting. Start on the third day after chemotherapy. 10/22/23   Cameron Cea, MD  oxyCODONE  (ROXICODONE ) 5 MG immediate release tablet Take 1 tablet (5 mg total) by mouth every 6 (six) hours as needed for severe pain (pain score 7-10). 11/05/23   Caralyn Chandler, MD  prochlorperazine  (COMPAZINE ) 10 MG tablet Take 1 tablet (10 mg total) by mouth every 6 (six) hours as needed for nausea or vomiting. 10/22/23  Gudena, Vinay, MD  rizatriptan  (MAXALT ) 10 MG tablet Take 1 tablet (10 mg total) by mouth as needed. may repeat once after 2 hours 07/01/23        Family History  Problem Relation Age of Onset   Colon polyps Mother    Hyperlipidemia Father    Colon polyps Sister    Colon polyps Brother    Colon cancer Neg Hx    Esophageal cancer Neg Hx    Rectal cancer Neg Hx    Stomach cancer Neg Hx    Sleep apnea Neg Hx     Social History    Socioeconomic History   Marital status: Married    Spouse name: Not on file   Number of children: Not on file   Years of education: Not on file   Highest education level: Not on file  Occupational History   Not on file  Tobacco Use   Smoking status: Never    Passive exposure: Never   Smokeless tobacco: Never  Vaping Use   Vaping status: Never Used  Substance and Sexual Activity   Alcohol use: Yes    Comment: weekends - social    Drug use: No   Sexual activity: Yes    Partners: Male    Birth control/protection: Post-menopausal    Comment: intercoure age 38, less than 5 sexual partners, des neg  Other Topics Concern   Not on file  Social History Narrative   Not on file   Social Drivers of Health   Financial Resource Strain: Not on file  Food Insecurity: No Food Insecurity (10/22/2023)   Hunger Vital Sign    Worried About Running Out of Food in the Last Year: Never true    Ran Out of Food in the Last Year: Never true  Transportation Needs: No Transportation Needs (10/22/2023)   PRAPARE - Administrator, Civil Service (Medical): No    Lack of Transportation (Non-Medical): No  Physical Activity: Not on file  Stress: Not on file  Social Connections: Not on file     Review of Systems: A 12 point ROS discussed and pertinent positives are indicated in the HPI above.  All other systems are negative.  Review of Systems  Vital Signs: LMP 02/01/2022 Comment: last period in 02/2022, "spotting for the last week", pt reports she is menopausal per MD  Advance Care Plan: The advanced care place/surrogate decision maker was discussed at the time of visit and the patient did not wish to discuss or was not able to name a surrogate decision maker or provide an advance care plan.  Physical Exam  Imaging: DG Chest Port 1 View Result Date: 11/05/2023 CLINICAL DATA:  [id number] Port-A-Cath in place [id number] EXAM: PORTABLE CHEST - 1 VIEW COMPARISON:  CT 11/04/2023 FINDINGS:  Relatively low lung volumes.  No focal infiltrate or overt edema. Heart size and mediastinal contours are within normal limits. No effusion. Visualized bones unremarkable. IMPRESSION: Low volumes. No acute findings. Electronically Signed   By: Nicoletta Barrier M.D.   On: 11/05/2023 11:41   DG C-Arm 1-60 Min-No Report Result Date: 11/05/2023 Fluoroscopy was utilized by the requesting physician.  No radiographic interpretation.   NM Bone Scan Whole Body Result Date: 11/04/2023 CLINICAL DATA:  Breast cancer.  Invasive breast cancer EXAM: NUCLEAR MEDICINE WHOLE BODY BONE SCAN TECHNIQUE: Whole body anterior and posterior images were obtained approximately 3 hours after intravenous injection of radiopharmaceutical. RADIOPHARMACEUTICALS:  20.9 mCi Technetium-6m MDP IV COMPARISON:  Chest CT CT  325 FINDINGS: No foci of radiotracer activity within the axillary or appendicular skeleton to localize metastatic skeletal disease. Uptake in the RIGHT maxilla is favored benign odontogenic activity IMPRESSION: No evidence skeletal metastasis. Electronically Signed   By: Deboraha Fallow M.D.   On: 11/04/2023 14:54   CT CHEST ABDOMEN PELVIS W CONTRAST Result Date: 11/04/2023 CLINICAL DATA:  New diagnosis breast cancer, initial staging * Tracking Code: BO * EXAM: CT CHEST, ABDOMEN, AND PELVIS WITH CONTRAST TECHNIQUE: Multidetector CT imaging of the chest, abdomen and pelvis was performed following the standard protocol during bolus administration of intravenous contrast. RADIATION DOSE REDUCTION: This exam was performed according to the departmental dose-optimization program which includes automated exposure control, adjustment of the mA and/or kV according to patient size and/or use of iterative reconstruction technique. CONTRAST:  OMNIPAQUE  IOHEXOL  300 MG/ML  SOLN COMPARISON:  None Available. FINDINGS: CT CHEST FINDINGS Cardiovascular: No significant vascular findings. Normal heart size. No pericardial effusion.  Mediastinum/Nodes: Abnormally enlarged right axillary lymph nodes measuring up to 1.8 x 0.9 cm (series 2, image 16). No other enlarged mediastinal, hilar, or axillary lymph nodes. Thyroid  gland, trachea, and esophagus demonstrate no significant findings. Lungs/Pleura: Tiny pulmonary nodule of the peripheral right apex measuring 0.2 cm (series 7, image 23). No pleural effusion or pneumothorax. Musculoskeletal: Large mass in the central right breast measuring 4.7 x 4.1 cm, containing a biopsy marking clip (series 2, image 22). No acute osseous findings. CT ABDOMEN PELVIS FINDINGS Hepatobiliary: No solid liver abnormality is seen. Small fluid attenuation cyst of the left lobe of the liver, benign, requiring no further follow-up or characterization (series 2, image 41). Hepatomegaly, maximum coronal span 20.0 cm. Hepatic steatosis. No gallstones, gallbladder wall thickening, or biliary dilatation. Pancreas: Unremarkable. No pancreatic ductal dilatation or surrounding inflammatory changes. Spleen: Normal in size without significant abnormality. Adrenals/Urinary Tract: Adrenal glands are unremarkable. Kidneys are normal, without renal calculi, solid lesion, or hydronephrosis. Bladder is unremarkable. Stomach/Bowel: Stomach is within normal limits. Appendix appears normal. No evidence of bowel wall thickening, distention, or inflammatory changes. Vascular/Lymphatic: No significant vascular findings are present. No enlarged abdominal or pelvic lymph nodes. Reproductive: No mass or other abnormality. Other: No abdominal wall hernia or abnormality. No ascites. Musculoskeletal: No acute osseous findings. IMPRESSION: 1. Large mass in the central right breast measuring 4.7 x 4.1 cm, containing a biopsy marking clip, consistent with known primary breast malignancy. 2. Abnormally enlarged right axillary lymph nodes measuring up to 1.8 x 0.9 cm, concerning for nodal metastatic disease. 3. Tiny pulmonary nodule of the peripheral  right apex measuring 0.2 cm, nonspecific and statistically likely benign and incidental. Attention on follow-up. 4. No evidence of lymphadenopathy or metastatic disease in the abdomen or pelvis. 5. Hepatomegaly and hepatic steatosis. Electronically Signed   By: Fredricka Jenny M.D.   On: 11/04/2023 11:00   ECHOCARDIOGRAM COMPLETE Result Date: 10/31/2023    ECHOCARDIOGRAM REPORT   Patient Name:   PRERNA HAROLD Date of Exam: 10/31/2023 Medical Rec #:  [id number]       Height:       60.0 in Accession #:    [id number]      Weight:       184.0 lb Date of Birth:  May 21, 1966        BSA:          1.802 m Patient Age:    57 years        BP:           133/83 mmHg  Patient Gender: F               HR:           78 bpm. Exam Location:  Inpatient Procedure: 2D Echo, Cardiac Doppler, Color Doppler and Strain Analysis (Both            Spectral and Color Flow Doppler were utilized during procedure). Indications:    Chemo  History:        Patient has no prior history of Echocardiogram examinations.                 Risk Factors:Hypertension.  Sonographer:    Andrena Bang Referring Phys: [id number] Cameron Cea IMPRESSIONS  1. Left ventricular ejection fraction, by estimation, is 55 to 60%. The left ventricle has normal function. The left ventricle has no regional wall motion abnormalities. Left ventricular diastolic parameters were normal. The average left ventricular global longitudinal strain is -22.7 %. The global longitudinal strain is normal.  2. Right ventricular systolic function is normal. The right ventricular size is normal.  3. The mitral valve is normal in structure. Trivial mitral valve regurgitation. No evidence of mitral stenosis.  4. Elevated gradient but normal tracing and VTI suggest borderline gradient due to anemia or other high flow state. The aortic valve is normal in structure. Aortic valve regurgitation is not visualized. No aortic stenosis is present.  5. The inferior vena cava is normal in size with greater than  50% respiratory variability, suggesting right atrial pressure of 3 mmHg. FINDINGS  Left Ventricle: Left ventricular ejection fraction, by estimation, is 55 to 60%. The left ventricle has normal function. The left ventricle has no regional wall motion abnormalities. The average left ventricular global longitudinal strain is -22.7 %. Strain was performed and the global longitudinal strain is normal. The left ventricular internal cavity size was normal in size. There is no left ventricular hypertrophy. Left ventricular diastolic parameters were normal. Right Ventricle: The right ventricular size is normal. No increase in right ventricular wall thickness. Right ventricular systolic function is normal. Left Atrium: Left atrial size was normal in size. Right Atrium: Right atrial size was normal in size. Pericardium: There is no evidence of pericardial effusion. Mitral Valve: The mitral valve is normal in structure. Trivial mitral valve regurgitation. No evidence of mitral valve stenosis. Tricuspid Valve: The tricuspid valve is normal in structure. Tricuspid valve regurgitation is not demonstrated. No evidence of tricuspid stenosis. Aortic Valve: Elevated gradient but normal tracing and VTI suggest borderline gradient due to anemia or other high flow state. The aortic valve is normal in structure. Aortic valve regurgitation is not visualized. No aortic stenosis is present. Aortic valve mean gradient measures 8.0 mmHg. Aortic valve peak gradient measures 16.2 mmHg. Aortic valve area, by VTI measures 2.76 cm. Pulmonic Valve: The pulmonic valve was normal in structure. Pulmonic valve regurgitation is not visualized. No evidence of pulmonic stenosis. Aorta: The aortic root is normal in size and structure. Venous: The inferior vena cava is normal in size with greater than 50% respiratory variability, suggesting right atrial pressure of 3 mmHg. IAS/Shunts: No atrial level shunt detected by color flow Doppler. Additional  Comments: A venous catheter is visualized.  LEFT VENTRICLE PLAX 2D LVIDd:         4.50 cm     Diastology LVIDs:         3.10 cm     LV e' medial:    7.94 cm/s LV PW:  1.10 cm     LV E/e' medial:  9.0 LV IVS:        0.70 cm     LV e' lateral:   10.20 cm/s LVOT diam:     2.35 cm     LV E/e' lateral: 7.0 LV SV:         108 LV SV Index:   60          2D Longitudinal Strain LVOT Area:     4.34 cm    2D Strain GLS Avg:     -22.7 %  LV Volumes (MOD) LV vol d, MOD A2C: 92.5 ml LV vol d, MOD A4C: 88.8 ml LV vol s, MOD A2C: 35.8 ml LV vol s, MOD A4C: 31.7 ml LV SV MOD A2C:     56.7 ml LV SV MOD A4C:     88.8 ml LV SV MOD BP:      57.2 ml RIGHT VENTRICLE RV S prime:     9.90 cm/s TAPSE (M-mode): 1.9 cm LEFT ATRIUM             Index LA diam:        3.50 cm 1.94 cm/m LA Vol (A2C):   31.5 ml 17.48 ml/m LA Vol (A4C):   23.5 ml 13.04 ml/m LA Biplane Vol: 27.9 ml 15.49 ml/m  AORTIC VALVE AV Area (Vmax):    3.00 cm AV Area (Vmean):   2.73 cm AV Area (VTI):     2.76 cm AV Vmax:           201.00 cm/s AV Vmean:          134.000 cm/s AV VTI:            0.391 m AV Peak Grad:      16.2 mmHg AV Mean Grad:      8.0 mmHg LVOT Vmax:         139.00 cm/s LVOT Vmean:        84.300 cm/s LVOT VTI:          0.249 m LVOT/AV VTI ratio: 0.64  AORTA Ao Asc diam: 2.50 cm MITRAL VALVE MV Area (PHT): 3.89 cm     SHUNTS MV Decel Time: 195 msec     Systemic VTI:  0.25 m MV E velocity: 71.10 cm/s   Systemic Diam: 2.35 cm MV A velocity: 102.00 cm/s MV E/A ratio:  0.70 Arta Lark Electronically signed by Arta Lark Signature Date/Time: 10/31/2023/2:07:44 PM    Final    MR BREAST BILATERAL W WO CONTRAST INC CAD Result Date: 10/30/2023 CLINICAL DATA:  Staging exam. Newly diagnosed right breast invasive ductal cancer. Patient had a benign right axillary lymph node biopsy. EXAM: BILATERAL BREAST MRI WITH AND WITHOUT CONTRAST TECHNIQUE: Multiplanar, multisequence MR images of both breasts were obtained prior to and following the  intravenous administration of 8 ml of Gadavist  Three-dimensional MR images were rendered by post-processing of the original MR data on an independent workstation. The three-dimensional MR images were interpreted, and findings are reported in the following complete MRI report for this study. Three dimensional images were evaluated at the independent interpreting workstation using the DynaCAD thin client. COMPARISON:  None available. FINDINGS: Breast composition: b. Scattered fibroglandular tissue. Background parenchymal enhancement: Mild Right breast: There is a large irregular enhancing mass in the retroareolar right breast measuring 4.7 x 3.8 x 3.9 cm (series 6, image 46), consistent with biopsy proven malignancy. No additional suspicious enhancing mass or non mass enhancement elsewhere in  the right breast. Left breast: No suspicious mass or abnormal enhancement. Lymph nodes: There is an enlarged lymph node in the low right axilla containing clip artifact consistent with recently biopsied benign reactive lymph node. There are no additional abnormal lymph nodes in the bilateral axillary regions. Ancillary findings:  None. IMPRESSION: 1. Biopsy-proven malignancy in the retroareolar right breast measuring up to 4.7 cm. 2. No additional suspicious enhancing mass or non mass enhancement elsewhere in the right breast or left breast. RECOMMENDATION: Continue treatment plan for known right breast cancer. BI-RADS CATEGORY  6: Known biopsy-proven malignancy. Electronically Signed   By: Allena Ito M.D.   On: 10/30/2023 10:22    Labs:  CBC: Recent Labs    10/22/23 1209 11/06/23 1003  WBC 7.3 9.7  HGB 14.4 14.1  HCT 43.2 42.8  PLT 331 332    COAGS: No results for input(s): "INR", "APTT" in the last 8760 hours.  BMP: Recent Labs    10/22/23 1209 11/06/23 1003  NA 137 141  K 3.8 3.7  CL 102 106  CO2 29 29  GLUCOSE 127* 90  BUN 17 12  CALCIUM 9.6 9.3  CREATININE 0.78 0.73  GFRNONAA >60 >60     LIVER FUNCTION TESTS: Recent Labs    10/22/23 1209 11/06/23 1003  BILITOT 0.4 0.3  AST 20 19  ALT 29 29  ALKPHOS 78 73  PROT 7.4 7.7  ALBUMIN 4.4 4.5    TUMOR MARKERS: No results for input(s): "AFPTM", "CEA", "CA199", "CHROMGRNA" in the last 8760 hours.  Assessment and Plan:  Patient is with right breast cancer and chemotherapy initiation scheduled for image guided port a catheter placement 11/10/23.   Risks and benefits of image guided port-a-catheter placement was discussed with the patient including, but not limited to bleeding, infection, pneumothorax, or fibrin sheath development and need for additional procedures.  All of the patient's questions were answered, patient is agreeable to proceed. Consent signed and in chart.  Thank you for allowing our service to participate in Gabrielle Cross 's care.  Electronically Signed: Pasty Bongo, PA-C   11/08/2023, 9:45 AM    I spent a total of  30 Minutes   in face to face in clinical consultation, greater than 50% of which was counseling/coordinating care for image guided port a cath placement.

## 2023-11-10 ENCOUNTER — Ambulatory Visit (HOSPITAL_COMMUNITY)
Admission: RE | Admit: 2023-11-10 | Discharge: 2023-11-10 | Disposition: A | Discharge status: Home / Self Care | Source: Ambulatory Visit | Attending: General Surgery | Admitting: General Surgery

## 2023-11-10 ENCOUNTER — Encounter (HOSPITAL_COMMUNITY): Payer: Self-pay

## 2023-11-10 ENCOUNTER — Other Ambulatory Visit: Payer: Self-pay

## 2023-11-10 DIAGNOSIS — C50411 Malignant neoplasm of upper-outer quadrant of right female breast: Secondary | ICD-10-CM | POA: Insufficient documentation

## 2023-11-10 DIAGNOSIS — C50919 Malignant neoplasm of unspecified site of unspecified female breast: Secondary | ICD-10-CM | POA: Diagnosis not present

## 2023-11-10 DIAGNOSIS — G4733 Obstructive sleep apnea (adult) (pediatric): Secondary | ICD-10-CM | POA: Diagnosis not present

## 2023-11-10 DIAGNOSIS — I10 Essential (primary) hypertension: Secondary | ICD-10-CM | POA: Diagnosis not present

## 2023-11-10 HISTORY — PX: IR IMAGING GUIDED PORT INSERTION: IMG5740

## 2023-11-10 MED ORDER — MIDAZOLAM HCL 2 MG/2ML IJ SOLN
INTRAMUSCULAR | Status: AC
Start: 2023-11-10 — End: ?
  Filled 2023-11-10: qty 2

## 2023-11-10 MED ORDER — LIDOCAINE-EPINEPHRINE 1 %-1:100000 IJ SOLN
INTRAMUSCULAR | Status: AC
  Filled 2023-11-10: qty 1

## 2023-11-10 MED ORDER — FENTANYL CITRATE (PF) 100 MCG/2ML IJ SOLN
INTRAMUSCULAR | Status: AC
  Filled 2023-11-10: qty 2

## 2023-11-10 MED ORDER — MIDAZOLAM HCL 2 MG/2ML IJ SOLN
INTRAMUSCULAR | Status: AC
  Filled 2023-11-10: qty 2

## 2023-11-10 MED ORDER — LIDOCAINE-EPINEPHRINE 1 %-1:100000 IJ SOLN
20.0000 mL | Freq: Once | INTRAMUSCULAR | Status: AC
  Administered 2023-11-10: 20 mL via INTRADERMAL

## 2023-11-10 MED ORDER — HEPARIN SOD (PORK) LOCK FLUSH 100 UNIT/ML IV SOLN
500.0000 [IU] | Freq: Once | INTRAVENOUS | Status: AC
  Administered 2023-11-10: 500 [IU] via INTRAVENOUS

## 2023-11-10 MED ORDER — HEPARIN SOD (PORK) LOCK FLUSH 100 UNIT/ML IV SOLN
INTRAVENOUS | Status: AC | PRN
  Administered 2023-11-10: 500 [IU] via INTRAVENOUS

## 2023-11-10 MED ORDER — HEPARIN SOD (PORK) LOCK FLUSH 100 UNIT/ML IV SOLN
INTRAVENOUS | Status: AC
  Filled 2023-11-10: qty 5

## 2023-11-10 MED ORDER — FENTANYL CITRATE (PF) 100 MCG/2ML IJ SOLN
INTRAMUSCULAR | Status: AC | PRN
  Administered 2023-11-10 (×2): 50 ug via INTRAVENOUS

## 2023-11-10 MED ORDER — MIDAZOLAM HCL 2 MG/2ML IJ SOLN
INTRAMUSCULAR | Status: AC | PRN
  Administered 2023-11-10 (×2): 1 mg via INTRAVENOUS

## 2023-11-10 NOTE — Telephone Encounter (Signed)
 [employer] 2021-05 - Specimen Collection Study to Evaluate Biomarkers in Subjects with Cancer    Pt was called to inform that she is not eligible for the above study due to her medical history in the past 5 years. Pt stated she understands this and had no questions at this time.   All questions were addressed during the call. Pt knows to contact the research team with any additional questions or concerns related to the study. She was thanked for her time and willingness to participate.  Irlanda Croghan, Ph.D. Clinical Research Coordinator [phone] 11/03/2023

## 2023-11-11 ENCOUNTER — Other Ambulatory Visit: Payer: Self-pay

## 2023-11-12 ENCOUNTER — Encounter: Payer: Self-pay | Admitting: Hematology and Oncology

## 2023-11-12 NOTE — Telephone Encounter (Signed)
 [employer] 2021-05 - Specimen Collection Study to Evaluate Biomarkers in Subjects with Cancer    The [name] was identified by Dr. Gudena as a potential candidate for the above study during Breast Clinic on 10/22/2023. Mrs. Harm was contacted by phone call regarding the above study today. This Designer, industrial/product introduced the study and provided a summary. Patient requested additional information to review in detail. The blank consent form along with the HIPAA authorization was sent to her via email.  Patient stated she would reach out to the research staff if she decides to participate. [name] was thanked for her time and consideration of the study.  Renelda Kilian, Ph.D. Clinical Research Coordinator [phone] 10/23/2023 12:13 PM

## 2023-11-13 ENCOUNTER — Encounter: Payer: Self-pay | Admitting: General Practice

## 2023-11-13 ENCOUNTER — Inpatient Hospital Stay (HOSPITAL_BASED_OUTPATIENT_CLINIC_OR_DEPARTMENT_OTHER): Admitting: Hematology and Oncology

## 2023-11-13 ENCOUNTER — Inpatient Hospital Stay

## 2023-11-13 ENCOUNTER — Encounter: Payer: Self-pay | Admitting: Hematology and Oncology

## 2023-11-13 VITALS — BP 118/78 | HR 97 | Temp 97.4°F | Resp 18 | Ht 60.0 in | Wt 179.8 lb

## 2023-11-13 DIAGNOSIS — Z95828 Presence of other vascular implants and grafts: Secondary | ICD-10-CM | POA: Insufficient documentation

## 2023-11-13 DIAGNOSIS — C50411 Malignant neoplasm of upper-outer quadrant of right female breast: Secondary | ICD-10-CM

## 2023-11-13 DIAGNOSIS — Z79620 Long term (current) use of immunosuppressive biologic: Secondary | ICD-10-CM | POA: Diagnosis not present

## 2023-11-13 DIAGNOSIS — Z1732 Human epidermal growth factor receptor 2 negative status: Secondary | ICD-10-CM | POA: Diagnosis not present

## 2023-11-13 DIAGNOSIS — Z5112 Encounter for antineoplastic immunotherapy: Secondary | ICD-10-CM | POA: Diagnosis not present

## 2023-11-13 DIAGNOSIS — Z170 Estrogen receptor positive status [ER+]: Secondary | ICD-10-CM | POA: Diagnosis not present

## 2023-11-13 DIAGNOSIS — Z1722 Progesterone receptor negative status: Secondary | ICD-10-CM | POA: Diagnosis not present

## 2023-11-13 DIAGNOSIS — Z5111 Encounter for antineoplastic chemotherapy: Secondary | ICD-10-CM | POA: Diagnosis not present

## 2023-11-13 LAB — CBC WITH DIFFERENTIAL (CANCER CENTER ONLY)
Abs Immature Granulocytes: 0.03 10*3/uL (ref 0.00–0.07)
Basophils Absolute: 0 10*3/uL (ref 0.0–0.1)
Basophils Relative: 1 %
Eosinophils Absolute: 0 10*3/uL (ref 0.0–0.5)
Eosinophils Relative: 0 %
HCT: 40.2 % (ref 36.0–46.0)
Hemoglobin: 13.3 g/dL (ref 12.0–15.0)
Immature Granulocytes: 1 %
Lymphocytes Relative: 26 %
Lymphs Abs: 1.1 10*3/uL (ref 0.7–4.0)
MCH: 27.7 pg (ref 26.0–34.0)
MCHC: 33.1 g/dL (ref 30.0–36.0)
MCV: 83.6 fL (ref 80.0–100.0)
Monocytes Absolute: 0.4 10*3/uL (ref 0.1–1.0)
Monocytes Relative: 9 %
Neutro Abs: 2.8 10*3/uL (ref 1.7–7.7)
Neutrophils Relative %: 63 %
Platelet Count: 299 10*3/uL (ref 150–400)
RBC: 4.81 MIL/uL (ref 3.87–5.11)
RDW: 13.9 % (ref 11.5–15.5)
WBC Count: 4.3 10*3/uL (ref 4.0–10.5)
nRBC: 0 % (ref 0.0–0.2)

## 2023-11-13 LAB — CMP (CANCER CENTER ONLY)
ALT: 26 U/L (ref 0–44)
AST: 17 U/L (ref 15–41)
Albumin: 3.9 g/dL (ref 3.5–5.0)
Alkaline Phosphatase: 76 U/L (ref 38–126)
Anion gap: 7 (ref 5–15)
BUN: 14 mg/dL (ref 6–20)
CO2: 27 mmol/L (ref 22–32)
Calcium: 9.1 mg/dL (ref 8.9–10.3)
Chloride: 104 mmol/L (ref 98–111)
Creatinine: 0.68 mg/dL (ref 0.44–1.00)
GFR, Estimated: >60 mL/min (ref >60)
Glucose, Bld: 121 mg/dL — ABNORMAL HIGH (ref 70–99)
Potassium: 3.5 mmol/L (ref 3.5–5.1)
Sodium: 138 mmol/L (ref 135–145)
Total Bilirubin: 0.4 mg/dL (ref 0.0–1.2)
Total Protein: 6.9 g/dL (ref 6.5–8.1)

## 2023-11-13 MED ORDER — DEXAMETHASONE SODIUM PHOSPHATE 10 MG/ML IJ SOLN
10.0000 mg | Freq: Once | INTRAMUSCULAR | Status: AC
  Administered 2023-11-13: 10 mg via INTRAVENOUS
  Filled 2023-11-13: qty 1

## 2023-11-13 MED ORDER — SODIUM CHLORIDE 0.9 % IV SOLN
80.0000 mg/m2 | Freq: Once | INTRAVENOUS | Status: AC
  Administered 2023-11-13: 150 mg via INTRAVENOUS
  Filled 2023-11-13: qty 25

## 2023-11-13 MED ORDER — HEPARIN SOD (PORK) LOCK FLUSH 100 UNIT/ML IV SOLN
500.0000 [IU] | Freq: Once | INTRAVENOUS | Status: AC | PRN
  Administered 2023-11-13: 500 [IU]

## 2023-11-13 MED ORDER — SODIUM CHLORIDE 0.9% FLUSH
10.0000 mL | INTRAVENOUS | Status: DC | PRN
  Administered 2023-11-13: 10 mL

## 2023-11-13 MED ORDER — SODIUM CHLORIDE 0.9 % IV SOLN: INTRAVENOUS | Status: DC

## 2023-11-13 MED ORDER — PALONOSETRON HCL INJECTION 0.25 MG/5ML
0.2500 mg | Freq: Once | INTRAVENOUS | Status: AC
  Administered 2023-11-13: 0.25 mg via INTRAVENOUS
  Filled 2023-11-13: qty 5

## 2023-11-13 MED ORDER — FAMOTIDINE IN NACL 20-0.9 MG/50ML-% IV SOLN
20.0000 mg | Freq: Once | INTRAVENOUS | Status: AC
  Administered 2023-11-13: 20 mg via INTRAVENOUS
  Filled 2023-11-13: qty 50

## 2023-11-13 MED ORDER — DIPHENHYDRAMINE HCL 50 MG/ML IJ SOLN
50.0000 mg | Freq: Once | INTRAMUSCULAR | Status: AC
  Administered 2023-11-13: 50 mg via INTRAVENOUS
  Filled 2023-11-13: qty 1

## 2023-11-13 MED ORDER — SODIUM CHLORIDE 0.9 % IV SOLN
194.8500 mg | Freq: Once | INTRAVENOUS | Status: AC
  Administered 2023-11-13: 190 mg via INTRAVENOUS
  Filled 2023-11-13: qty 19

## 2023-11-13 MED ORDER — SODIUM CHLORIDE 0.9% FLUSH
10.0000 mL | Freq: Once | INTRAVENOUS | Status: AC
  Administered 2023-11-13: 10 mL

## 2023-11-13 NOTE — Progress Notes (Signed)
 Patient Care Team: Laine Piggs, NP as PCP - General (Obstetrics and Gynecology) Lavoie, Marie-Lyne, MD as Consulting Physician (Obstetrics and Gynecology) Cameron Cea, MD as Consulting Physician (Hematology and Oncology) Auther Bo, RN as Oncology Nurse Navigator Alane Hsu, RN as Oncology Nurse Navigator Caralyn Chandler, MD as Consulting Physician (General Surgery) Colie Dawes, MD as Attending Physician (Radiation Oncology)  DIAGNOSIS:  Encounter Diagnosis  Name Primary?   Malignant neoplasm of upper-outer quadrant of right breast in female, estrogen receptor positive (HCC) Yes    SUMMARY OF ONCOLOGIC HISTORY: Oncology History  Malignant neoplasm of upper-outer quadrant of right breast in female, estrogen receptor positive (HCC)  10/07/2023 Initial Diagnosis   Screening mammogram detected right breast mass at 12:00 measuring 5.1 cm, 1 axillary lymph node biopsy was benign biopsy of the mass grade 3 IDC with necrosis ER 30% weak, PR 0%, Ki67 60%, HER2 negative   10/22/2023 Cancer Staging   Staging form: Breast, AJCC 8th Edition - Clinical stage from 10/22/2023: Stage IIIA (cT3, cN0, cM0, G3, ER+, PR-, HER2-) - Signed by Cameron Cea, MD on 10/22/2023 Stage prefix: Initial diagnosis Histologic grading system: 3 grade system Laterality: Right Staged by: Pathologist and managing physician Stage used in treatment planning: Yes National guidelines used in treatment planning: Yes Type of national guideline used in treatment planning: NCCN   11/02/2023 Genetic Testing   Negative [facility] CancerNext +RNAinsight Panel.  VUS in BARD1 at p.K438E (c.1312A>G).  Report date is 11/02/2023.   The [facility] CancerNext+RNAinsight Panel includes sequencing, rearrangement analysis, and RNA analysis for the following 40 genes: APC, ATM, BAP1, BARD1, BMPR1A, BRCA1, BRCA2, BRIP1, CDH1, CDKN2A, CHEK2, FH, FLCN, MET, MLH1, MSH2, MSH6, MUTYH, NF1, NTHL1, PALB2, PMS2, PTEN, RAD51C, RAD51D, RSP20,  SMAD4, STK11, TP53, TSC1, TSC2, and VHL (sequencing and deletion/duplication); AXIN2, HOXB13, MBD4, MSH3, POLD1 and POLE (sequencing only); EPCAM and GREM1 (deletion/duplication only).   11/06/2023 -  Chemotherapy   Patient is on Treatment Plan : BREAST Pembrolizumab  (200) D1 + Carboplatin  (1.5) D1,8,15 + Paclitaxel  (80) D1,8,15 q21d X 4 cycles / Pembrolizumab  (200) D1 + AC D1 q21d x 4 cycles       CHIEF COMPLIANT: Cycle 2 Taxol  carboplatin   HISTORY OF PRESENT ILLNESS: Ms. Gabrielle Cross is a 58 year old with above-mentioned history of breast cancer is currently on keynote 522 regimen and has received first cycle of chemotherapy with Taxol  and carboplatin  with Keytruda  last week and has tolerated it extremely well.  She is here for cycle 2.  She had few days of constipation but otherwise did not have any side effects.    ALLERGIES:  is allergic to shellfish allergy.  MEDICATIONS:  Current Outpatient Medications  Medication Sig Dispense Refill   dexamethasone  (DECADRON ) 4 MG tablet With Adriamycin Cytoxan take 1 tablet day after chemo and 1 tablet 2 days after chemo with food 8 tablet 0   hydrochlorothiazide  (HYDRODIURIL ) 12.5 MG tablet Take 1 tablet (12.5 mg total) by mouth daily. 90 tablet 4   imipramine  (TOFRANIL ) 25 MG tablet Take 3 tablets (75 mg total) by mouth daily. 90 tablet 5   lidocaine -prilocaine  (EMLA ) cream Apply to affected area once 30 g 3   ondansetron  (ZOFRAN ) 8 MG tablet Take 1 tablet (8 mg total) by mouth every 8 (eight) hours as needed for nausea or vomiting. Start on the third day after chemotherapy. 30 tablet 1   prochlorperazine  (COMPAZINE ) 10 MG tablet Take 1 tablet (10 mg total) by mouth every 6 (six) hours as needed for  nausea or vomiting. 30 tablet 1   rizatriptan  (MAXALT ) 10 MG tablet Take 1 tablet (10 mg total) by mouth as needed. may repeat once after 2 hours 4 tablet 5   No current facility-administered medications for this visit.    PHYSICAL EXAMINATION: ECOG  PERFORMANCE STATUS: 1 - Symptomatic but completely ambulatory  Vitals:   11/13/23 0850  BP: 118/78  Pulse: 97  Resp: 18  Temp: (!) 97.4 F (36.3 C)  SpO2: 100%   Filed Weights   11/13/23 0850  Weight: 179 lb 12.8 oz (81.6 kg)      LABORATORY DATA:  I have reviewed the data as listed    Latest Ref Rng & Units 11/13/2023    7:58 AM 11/06/2023   10:03 AM 10/22/2023   12:09 PM  CMP  Glucose 70 - 99 mg/dL 045  90  409   BUN 6 - 20 mg/dL 14  12  17    Creatinine 0.44 - 1.00 mg/dL 8.11  9.14  7.82   Sodium 135 - 145 mmol/L 138  141  137   Potassium 3.5 - 5.1 mmol/L 3.5  3.7  3.8   Chloride 98 - 111 mmol/L 104  106  102   CO2 22 - 32 mmol/L 27  29  29    Calcium 8.9 - 10.3 mg/dL 9.1  9.3  9.6   Total Protein 6.5 - 8.1 g/dL 6.9  7.7  7.4   Total Bilirubin 0.0 - 1.2 mg/dL 0.4  0.3  0.4   Alkaline Phos 38 - 126 U/L 76  73  78   AST 15 - 41 U/L 17  19  20    ALT 0 - 44 U/L 26  29  29      Lab Results  Component Value Date   WBC 4.3 11/13/2023   HGB 13.3 11/13/2023   HCT 40.2 11/13/2023   MCV 83.6 11/13/2023   PLT 299 11/13/2023   NEUTROABS 2.8 11/13/2023    ASSESSMENT & PLAN:  Malignant neoplasm of upper-outer quadrant of right breast in female, estrogen receptor positive (HCC) 10/07/2023:Screening mammogram detected right breast mass at 12:00 measuring 5.1 cm, 1 axillary lymph node biopsy was benign biopsy of the mass grade 3 IDC with necrosis ER 30% weak, PR 0%, Ki67 60%, HER2 negative   Treatment plan: Neoadjuvant chemotherapy with Taxol  carbo Keytruda  followed by Adriamycin Cytoxan Keytruda  Breast conserving surgery with sentinel lymph node biopsy Adjuvant radiation therapy Antiestrogen therapy with the final path is estrogen receptor positive CT CAP and bone scan 11/04/2023: 4.7 cm right breast mass, right axillary lymph node 1.8 cm, 0.2 cm lung nodule benign Breast MRI 10/30/2023: Right breast malignancy 4.7 cm, enlarged right axillary lymph  node ------------------------------------------------------------------------------------------------------------------------------------------------ Current treatment: Cycle 2 Taxol  carbo Keytruda  Chemo toxicities: Denied any nausea vomiting or GI upset Mild constipation a few days after chemo Return to clinic weekly for chemotherapy     No orders of the defined types were placed in this encounter.  The patient has a good understanding of the overall plan. she agrees with it. she will call with any problems that may develop before the next visit here. Total time spent: 30 mins including face to face time and time spent for planning, charting and co-ordination of care   Viinay K Scottlynn Lindell, MD 11/13/23

## 2023-11-13 NOTE — Research (Addendum)
 Z6109, ICE COMPRESS: RANDOMIZED TRIAL OF LIMB CRYOCOMPRESSION VERSUS CONTINUOUS COMPRESSION VERSUS LOW CYCLIC COMPRESSION FOR THE PREVENTION OF TAXANE-INDUCED PERIPHERAL NEUROPATHY   Cycle 2: Patient arrives today Accompanied by her husband for the cycle 2 visit. Confirmed patient does not have wounds, sores, or lesions to extremities. Patient has not had any vaccinations since last visit.  MD/PROVIDER VISIT: Patient sees Dr. Gudena for today's visit.   ADVERSE EVENTS: Patient reports no changes since last visit. Adverse Event CTCAE Grade Onset date Resolved date Relationship to Study Intervention Action Taken Comments  Skin atrophy (solicited) 0   NA    Skin hyperpigmentation (solicited) 0   NA    Skin hypopigmentation (solicited) 0   NA    Skin induration (solicited) 0   NA    Skin ulceration (solicited) 0   NA    Rash maculopapular (solicited) 0   NA    Nail changes (solicited) 0   NA    Cold intolerance (solicited) (general disorders and administration site conditions- other) 0   NA    Frostbite (solicited) (skin and subcutaneous tissue disorders- other) 0   NA      STUDY INTERVENTION & TOLERABILITY ASSESSMENTS: 10:52 AM Wraps appliedto all 4 limbs; pre-treatment started. 11:02 AM Study-required Tolerability check (5-15 min) performed.  11:27 AM Taxane treatment started. Device transitioned to treatment phase. Starting pressure is 0-5 mmHg.  11:52 AM Study-required Tolerability check (hourly) performed.  12:29 PM Taxane infusion completed. Device moved to post-treatment phase. 12:52 PM Study-required Tolerability check (hourly) performed.  01:00 PM Wraps removed. Pt confirmed no new wounds, sores, or lesions.  Was the treatment paused for a bathroom break? No   DISPOSITION: Upon completion off all study requirements, patient was remained in the infusion chair with RN, at bedside. Her next treatment is scheduled for 11/21/2023. Pt has no questions or concerns at this time.  The  patient was thanked for their time and continued voluntary participation in this study. [name] has been provided direct contact information and is encouraged to contact this Coordinator for any needs or questions.  Johanna Matto, Ph.D. Clinical Research Coordinator [phone] 11/13/2023 1:59 PM

## 2023-11-13 NOTE — Research (Signed)
 DCP-001: Use of a Clinical Trial Screening Tool to Address Cancer Health Disparities in the [facility] [facility] Research Program [facility])  [name] was identified by Dr. Gudena as a potential candidate for the above listed study.  This Clinical Research Coordinator met with Gabrielle Cross, [id number] on 11/13/23 in a manner and location that ensures patient privacy to discuss participation in the above listed research study.  Patient is Accompanied by her husband. A copy of the informed consent document and separate HIPAA Authorization was provided to the patient.  Patient reads, speaks, and understands [place].   Patient was provided with the business card of this Coordinator and encouraged to contact the research team with any questions.  Patient was provided the option of taking informed consent documents home to review and was encouraged to review at their convenience with their support network, including other care providers. Patient is comfortable with making a decision regarding study participation today.  As outlined in the informed consent form, this Coordinator and Gabrielle Cross discussed the purpose of the research study, the investigational nature of the study, study procedures and requirements for study participation, potential risks and benefits of study participation, as well as alternatives to participation.   The patient understands participation is voluntary and they may withdraw from study participation at any time. Patient understands enrollment is pending full eligibility review.   Confidentiality and how the patient's information will be used as part of study participation were discussed.  Patient was informed there is not reimbursement provided for their time and effort spent on trial participation.    All questions were answered to patient's satisfaction.  The informed consent and separate HIPAA Authorization was reviewed page by page.  The patient's mental and  emotional status is appropriate to provide informed consent, and the patient verbalizes an understanding of study participation.  Patient has agreed to participate in the above listed research study and has voluntarily signed the informed consent from (Protocol Version Date 09/09/23) and separate HIPAA Authorization, version 10/21/2023 on 11/13/23 at 9:27AM.  The patient was provided with a copy of the signed informed consent form and separate HIPAA Authorization for their reference.  No study specific procedures were obtained prior to the signing of the informed consent document.  Approximately 15 minutes were spent with the patient reviewing the informed consent documents.  Patient was not requested to complete a Release of Information form.  This coordinator has reviewed this patient's inclusion and exclusion criteria and confirmed Gabrielle Cross is eligible for study participation.  Patient will continue with enrollment.  Pt was thanked for her time and participation.   Gabrielle Cross, Ph.D. Clinical Research Coordinator [phone] 11/13/2023 9:50 AM

## 2023-11-13 NOTE — Progress Notes (Signed)
 [facility] Spiritual Care Note  Followed up in infusion for Spiritual Care check-in. Ms Gabrielle Cross reports that she is doing well and has no needs at this time, but is aware of ongoing chaplain availability, should needs arise or circumstances change.   [address] [name], [place], [facility] Pager [phone] Voicemail [phone]

## 2023-11-13 NOTE — Assessment & Plan Note (Signed)
 10/07/2023:Screening mammogram detected right breast mass at 12:00 measuring 5.1 cm, 1 axillary lymph node biopsy was benign biopsy of the mass grade 3 IDC with necrosis ER 30% weak, PR 0%, Ki67 60%, HER2 negative   Treatment plan: Neoadjuvant chemotherapy with Taxol  carbo Keytruda  followed by Adriamycin Cytoxan Keytruda  Breast conserving surgery with sentinel lymph node biopsy Adjuvant radiation therapy Antiestrogen therapy with the final path is estrogen receptor positive CT CAP and bone scan 11/04/2023: 4.7 cm right breast mass, right axillary lymph node 1.8 cm, 0.2 cm lung nodule benign Breast MRI 10/30/2023: Right breast malignancy 4.7 cm, enlarged right axillary lymph node ------------------------------------------------------------------------------------------------------------------------------------------------ Current treatment: Cycle 2 Taxol  carbo Keytruda  Chemo toxicities:  Return to clinic weekly for chemotherapy

## 2023-11-13 NOTE — Patient Instructions (Signed)
 [facility] - A DEPT OF [facility]  Discharge Instructions: Thank you for choosing [facility] to provide your oncology and hematology care.   If you have a lab appointment with the Cancer Center, please go directly to the Cancer Center and check in at the registration area.   Wear comfortable clothing and clothing appropriate for easy access to any Portacath or PICC line.   We strive to give you quality time with your provider. You may need to reschedule your appointment if you arrive late (15 or more minutes).  Arriving late affects you and other patients whose appointments are after yours.  Also, if you miss three or more appointments without notifying the office, you may be dismissed from the clinic at the provider's discretion.      For prescription refill requests, have your pharmacy contact our office and allow 72 hours for refills to be completed.    Today you received the following chemotherapy and/or immunotherapy agents: Paclitaxel , carboplatin       To help prevent nausea and vomiting after your treatment, we encourage you to take your nausea medication as directed.  BELOW ARE SYMPTOMS THAT SHOULD BE REPORTED IMMEDIATELY: *FEVER GREATER THAN 100.4 F (38 C) OR HIGHER *CHILLS OR SWEATING *NAUSEA AND VOMITING THAT IS NOT CONTROLLED WITH YOUR NAUSEA MEDICATION *UNUSUAL SHORTNESS OF BREATH *UNUSUAL BRUISING OR BLEEDING *URINARY PROBLEMS (pain or burning when urinating, or frequent urination) *BOWEL PROBLEMS (unusual diarrhea, constipation, pain near the anus) TENDERNESS IN MOUTH AND THROAT WITH OR WITHOUT PRESENCE OF ULCERS (sore throat, sores in mouth, or a toothache) UNUSUAL RASH, SWELLING OR PAIN  UNUSUAL VAGINAL DISCHARGE OR ITCHING   Items with * indicate a potential emergency and should be followed up as soon as possible or go to the Emergency Department if any problems should occur.  Please show the CHEMOTHERAPY ALERT CARD or  IMMUNOTHERAPY ALERT CARD at check-in to the Emergency Department and triage nurse.  Should you have questions after your visit or need to cancel or reschedule your appointment, please contact [facility] - A DEPT OF [facility]  Dept: [phone]  and follow the prompts.  Office hours are 8:00 a.m. to 4:30 p.m. Monday - Friday. Please note that voicemails left after 4:00 p.m. may not be returned until the following business day.  We are closed weekends and major holidays. You have access to a nurse at all times for urgent questions. Please call the main number to the clinic Dept: [phone] and follow the prompts.   For any non-urgent questions, you may also contact your provider using MyChart. We now offer e-Visits for anyone 17 and older to request care online for non-urgent symptoms. For details visit [email or web address].   Also download the MyChart app! Go to the app store, search MyChart, open the app, select [facility], and log in with your MyChart username and password.  Pembrolizumab  Injection What is this medication? PEMBROLIZUMAB  (PEM broe LIZ ue mab) treats some types of cancer. It works by helping your immune system slow or stop the spread of cancer cells. It is a monoclonal antibody. This medicine may be used for other purposes; ask your health care provider or pharmacist if you have questions. COMMON BRAND NAME(S): Keytruda  What should I tell my care team before I take this medication? They need to know if you have any of these conditions: Allogeneic stem cell transplant (uses someone else's stem  cells) Autoimmune diseases, such as Crohn disease, ulcerative colitis, lupus History of chest radiation Nervous system problems, such as Guillain-Barre syndrome, myasthenia gravis Organ transplant An unusual or allergic reaction to pembrolizumab , other medications, foods, dyes, or preservatives Pregnant or trying to get  pregnant Breast-feeding How should I use this medication? This medication is injected into a vein. It is given by your care team in a hospital or clinic setting. A special MedGuide will be given to you before each treatment. Be sure to read this information carefully each time. Talk to your care team about the use of this medication in children. While it may be prescribed for children as young as 6 months for selected conditions, precautions do apply. Overdosage: If you think you have taken too much of this medicine contact a poison control center or emergency room at once. NOTE: This medicine is only for you. Do not share this medicine with others. What if I miss a dose? Keep appointments for follow-up doses. It is important not to miss your dose. Call your care team if you are unable to keep an appointment. What may interact with this medication? Interactions have not been studied. This list may not describe all possible interactions. Give your health care provider a list of all the medicines, herbs, non-prescription drugs, or dietary supplements you use. Also tell them if you smoke, drink alcohol, or use illegal drugs. Some items may interact with your medicine. What should I watch for while using this medication? Your condition will be monitored carefully while you are receiving this medication. You may need blood work while taking this medication. This medication may cause serious skin reactions. They can happen weeks to months after starting the medication. Contact your care team right away if you notice fevers or flu-like symptoms with a rash. The rash may be red or purple and then turn into blisters or peeling of the skin. You may also notice a red rash with swelling of the face, lips, or lymph nodes in your neck or under your arms. Tell your care team right away if you have any change in your eyesight. Talk to your care team if you may be pregnant. Serious birth defects can occur if you  take this medication during pregnancy and for 4 months after the last dose. You will need a negative pregnancy test before starting this medication. Contraception is recommended while taking this medication and for 4 months after the last dose. Your care team can help you find the option that works for you. Do not breastfeed while taking this medication and for 4 months after the last dose. What side effects may I notice from receiving this medication? Side effects that you should report to your care team as soon as possible: Allergic reactions--skin rash, itching, hives, swelling of the face, lips, tongue, or throat Dry cough, shortness of breath or trouble breathing Eye pain, redness, irritation, or discharge with blurry or decreased vision Heart muscle inflammation--unusual weakness or fatigue, shortness of breath, chest pain, fast or irregular heartbeat, dizziness, swelling of the ankles, feet, or hands Hormone gland problems--headache, sensitivity to light, unusual weakness or fatigue, dizziness, fast or irregular heartbeat, increased sensitivity to cold or heat, excessive sweating, constipation, hair loss, increased thirst or amount of urine, tremors or shaking, irritability Infusion reactions--chest pain, shortness of breath or trouble breathing, feeling faint or lightheaded Kidney injury (glomerulonephritis)--decrease in the amount of urine, red or dark brown urine, foamy or bubbly urine, swelling of the ankles, hands, or  feet Liver injury--right upper belly pain, loss of appetite, nausea, light-colored stool, dark yellow or brown urine, yellowing skin or eyes, unusual weakness or fatigue Pain, tingling, or numbness in the hands or feet, muscle weakness, change in vision, confusion or trouble speaking, loss of balance or coordination, trouble walking, seizures Rash, fever, and swollen lymph nodes Redness, blistering, peeling, or loosening of the skin, including inside the mouth Sudden or  severe stomach pain, bloody diarrhea, fever, nausea, vomiting Side effects that usually do not require medical attention (report to your care team if they continue or are bothersome): Bone, joint, or muscle pain Diarrhea Fatigue Loss of appetite Nausea Skin rash This list may not describe all possible side effects. Call your doctor for medical advice about side effects. You may report side effects to FDA at 1-800-FDA-1088. Where should I keep my medication? This medication is given in a hospital or clinic. It will not be stored at home. NOTE: This sheet is a summary. It may not cover all possible information. If you have questions about this medicine, talk to your doctor, pharmacist, or health care provider.  2024 Elsevier/Gold Standard (2021-10-02 00:00:00) Paclitaxel  Injection What is this medication? PACLITAXEL  (PAK li TAX el) treats some types of cancer. It works by slowing down the growth of cancer cells. This medicine may be used for other purposes; ask your health care provider or pharmacist if you have questions. COMMON BRAND NAME(S): Onxol, Taxol  What should I tell my care team before I take this medication? They need to know if you have any of these conditions: Heart disease Liver disease Low white blood cell levels An unusual or allergic reaction to paclitaxel , other medications, foods, dyes, or preservatives If you or your partner are pregnant or trying to get pregnant Breast-feeding How should I use this medication? This medication is injected into a vein. It is given by your care team in a hospital or clinic setting. Talk to your care team about the use of this medication in children. While it may be given to children for selected conditions, precautions do apply. Overdosage: If you think you have taken too much of this medicine contact a poison control center or emergency room at once. NOTE: This medicine is only for you. Do not share this medicine with others. What if  I miss a dose? Keep appointments for follow-up doses. It is important not to miss your dose. Call your care team if you are unable to keep an appointment. What may interact with this medication? Do not take this medication with any of the following: Live virus vaccines Other medications may affect the way this medication works. Talk with your care team about all of the medications you take. They may suggest changes to your treatment plan to lower the risk of side effects and to make sure your medications work as intended. This list may not describe all possible interactions. Give your health care provider a list of all the medicines, herbs, non-prescription drugs, or dietary supplements you use. Also tell them if you smoke, drink alcohol, or use illegal drugs. Some items may interact with your medicine. What should I watch for while using this medication? Your condition will be monitored carefully while you are receiving this medication. You may need blood work while taking this medication. This medication may make you feel generally unwell. This is not uncommon as chemotherapy can affect healthy cells as well as cancer cells. Report any side effects. Continue your course of treatment even though you  feel ill unless your care team tells you to stop. This medication can cause serious allergic reactions. To reduce the risk, your care team may give you other medications to take before receiving this one. Be sure to follow the directions from your care team. This medication may increase your risk of getting an infection. Call your care team for advice if you get a fever, chills, sore throat, or other symptoms of a cold or flu. Do not treat yourself. Try to avoid being around people who are sick. This medication may increase your risk to bruise or bleed. Call your care team if you notice any unusual bleeding. Be careful brushing or flossing your teeth or using a toothpick because you may get an infection or  bleed more easily. If you have any dental work done, tell your dentist you are receiving this medication. Talk to your care team if you may be pregnant. Serious birth defects can occur if you take this medication during pregnancy. Talk to your care team before breastfeeding. Changes to your treatment plan may be needed. What side effects may I notice from receiving this medication? Side effects that you should report to your care team as soon as possible: Allergic reactions--skin rash, itching, hives, swelling of the face, lips, tongue, or throat Heart rhythm changes--fast or irregular heartbeat, dizziness, feeling faint or lightheaded, chest pain, trouble breathing Increase in blood pressure Infection--fever, chills, cough, sore throat, wounds that don't heal, pain or trouble when passing urine, general feeling of discomfort or being unwell Low blood pressure--dizziness, feeling faint or lightheaded, blurry vision Low red blood cell level--unusual weakness or fatigue, dizziness, headache, trouble breathing Painful swelling, warmth, or redness of the skin, blisters or sores at the infusion site Pain, tingling, or numbness in the hands or feet Slow heartbeat--dizziness, feeling faint or lightheaded, confusion, trouble breathing, unusual weakness or fatigue Unusual bruising or bleeding Side effects that usually do not require medical attention (report to your care team if they continue or are bothersome): Diarrhea Hair loss Joint pain Loss of appetite Muscle pain Nausea Vomiting This list may not describe all possible side effects. Call your doctor for medical advice about side effects. You may report side effects to FDA at 1-800-FDA-1088. Where should I keep my medication? This medication is given in a hospital or clinic. It will not be stored at home. NOTE: This sheet is a summary. It may not cover all possible information. If you have questions about this medicine, talk to your doctor,  pharmacist, or health care provider.  2024 Elsevier/Gold Standard (2021-10-09 00:00:00) Carboplatin  Injection What is this medication? CARBOPLATIN  (KAR boe pla tin) treats some types of cancer. It works by slowing down the growth of cancer cells. This medicine may be used for other purposes; ask your health care provider or pharmacist if you have questions. COMMON BRAND NAME(S): Paraplatin  What should I tell my care team before I take this medication? They need to know if you have any of these conditions: Blood disorders Hearing problems Kidney disease Recent or ongoing radiation therapy An unusual or allergic reaction to carboplatin , cisplatin, other medications, foods, dyes, or preservatives Pregnant or trying to get pregnant Breast-feeding How should I use this medication? This medication is injected into a vein. It is given by your care team in a hospital or clinic setting. Talk to your care team about the use of this medication in children. Special care may be needed. Overdosage: If you think you have taken too much of this  medicine contact a poison control center or emergency room at once. NOTE: This medicine is only for you. Do not share this medicine with others. What if I miss a dose? Keep appointments for follow-up doses. It is important not to miss your dose. Call your care team if you are unable to keep an appointment. What may interact with this medication? Medications for seizures Some antibiotics, such as amikacin, gentamicin, neomycin, streptomycin, tobramycin Vaccines This list may not describe all possible interactions. Give your health care provider a list of all the medicines, herbs, non-prescription drugs, or dietary supplements you use. Also tell them if you smoke, drink alcohol, or use illegal drugs. Some items may interact with your medicine. What should I watch for while using this medication? Your condition will be monitored carefully while you are receiving  this medication. You may need blood work while taking this medication. This medication may make you feel generally unwell. This is not uncommon, as chemotherapy can affect healthy cells as well as cancer cells. Report any side effects. Continue your course of treatment even though you feel ill unless your care team tells you to stop. In some cases, you may be given additional medications to help with side effects. Follow all directions for their use. This medication may increase your risk of getting an infection. Call your care team for advice if you get a fever, chills, sore throat, or other symptoms of a cold or flu. Do not treat yourself. Try to avoid being around people who are sick. Avoid taking medications that contain aspirin, acetaminophen , ibuprofen, naproxen, or ketoprofen unless instructed by your care team. These medications may hide a fever. Be careful brushing or flossing your teeth or using a toothpick because you may get an infection or bleed more easily. If you have any dental work done, tell your dentist you are receiving this medication. Talk to your care team if you wish to become pregnant or think you might be pregnant. This medication can cause serious birth defects. Talk to your care team about effective forms of contraception. Do not breast-feed while taking this medication. What side effects may I notice from receiving this medication? Side effects that you should report to your care team as soon as possible: Allergic reactions--skin rash, itching, hives, swelling of the face, lips, tongue, or throat Infection--fever, chills, cough, sore throat, wounds that don't heal, pain or trouble when passing urine, general feeling of discomfort or being unwell Low red blood cell level--unusual weakness or fatigue, dizziness, headache, trouble breathing Pain, tingling, or numbness in the hands or feet, muscle weakness, change in vision, confusion or trouble speaking, loss of balance or  coordination, trouble walking, seizures Unusual bruising or bleeding Side effects that usually do not require medical attention (report to your care team if they continue or are bothersome): Hair loss Nausea Unusual weakness or fatigue Vomiting This list may not describe all possible side effects. Call your doctor for medical advice about side effects. You may report side effects to FDA at 1-800-FDA-1088. Where should I keep my medication? This medication is given in a hospital or clinic. It will not be stored at home. NOTE: This sheet is a summary. It may not cover all possible information. If you have questions about this medicine, talk to your doctor, pharmacist, or health care provider.  2024 Elsevier/Gold Standard (2021-09-11 00:00:00)

## 2023-11-14 ENCOUNTER — Encounter: Payer: Self-pay | Admitting: *Deleted

## 2023-11-14 ENCOUNTER — Telehealth: Payer: Self-pay

## 2023-11-14 NOTE — Telephone Encounter (Signed)
 Telephone encounter opened in error. No telephone communication with the patient occurred.

## 2023-11-15 ENCOUNTER — Other Ambulatory Visit: Payer: Self-pay

## 2023-11-17 ENCOUNTER — Other Ambulatory Visit (HOSPITAL_COMMUNITY): Payer: Self-pay

## 2023-11-19 ENCOUNTER — Other Ambulatory Visit: Payer: Self-pay

## 2023-11-21 ENCOUNTER — Inpatient Hospital Stay

## 2023-11-21 ENCOUNTER — Encounter: Payer: Self-pay | Admitting: Hematology and Oncology

## 2023-11-21 ENCOUNTER — Inpatient Hospital Stay (HOSPITAL_BASED_OUTPATIENT_CLINIC_OR_DEPARTMENT_OTHER): Admitting: Adult Health

## 2023-11-21 ENCOUNTER — Encounter: Payer: Self-pay | Admitting: Adult Health

## 2023-11-21 VITALS — BP 131/74 | HR 99 | Temp 98.8°F | Resp 18 | Ht 60.0 in | Wt 179.4 lb

## 2023-11-21 DIAGNOSIS — Z5111 Encounter for antineoplastic chemotherapy: Secondary | ICD-10-CM | POA: Diagnosis not present

## 2023-11-21 DIAGNOSIS — Z1732 Human epidermal growth factor receptor 2 negative status: Secondary | ICD-10-CM | POA: Diagnosis not present

## 2023-11-21 DIAGNOSIS — Z170 Estrogen receptor positive status [ER+]: Secondary | ICD-10-CM

## 2023-11-21 DIAGNOSIS — C50411 Malignant neoplasm of upper-outer quadrant of right female breast: Secondary | ICD-10-CM | POA: Diagnosis not present

## 2023-11-21 DIAGNOSIS — Z5112 Encounter for antineoplastic immunotherapy: Secondary | ICD-10-CM | POA: Diagnosis not present

## 2023-11-21 DIAGNOSIS — Z1722 Progesterone receptor negative status: Secondary | ICD-10-CM | POA: Diagnosis not present

## 2023-11-21 DIAGNOSIS — Z95828 Presence of other vascular implants and grafts: Secondary | ICD-10-CM

## 2023-11-21 DIAGNOSIS — Z79620 Long term (current) use of immunosuppressive biologic: Secondary | ICD-10-CM | POA: Diagnosis not present

## 2023-11-21 LAB — CBC WITH DIFFERENTIAL (CANCER CENTER ONLY)
Abs Immature Granulocytes: 0.03 10*3/uL (ref 0.00–0.07)
Basophils Absolute: 0 10*3/uL (ref 0.0–0.1)
Basophils Relative: 0 %
Eosinophils Absolute: 0 10*3/uL (ref 0.0–0.5)
Eosinophils Relative: 0 %
HCT: 39.7 % (ref 36.0–46.0)
Hemoglobin: 13.4 g/dL (ref 12.0–15.0)
Immature Granulocytes: 1 %
Lymphocytes Relative: 37 %
Lymphs Abs: 1.7 10*3/uL (ref 0.7–4.0)
MCH: 28 pg (ref 26.0–34.0)
MCHC: 33.8 g/dL (ref 30.0–36.0)
MCV: 82.9 fL (ref 80.0–100.0)
Monocytes Absolute: 0.4 10*3/uL (ref 0.1–1.0)
Monocytes Relative: 8 %
Neutro Abs: 2.4 10*3/uL (ref 1.7–7.7)
Neutrophils Relative %: 54 %
Platelet Count: 362 10*3/uL (ref 150–400)
RBC: 4.79 MIL/uL (ref 3.87–5.11)
RDW: 13.9 % (ref 11.5–15.5)
WBC Count: 4.6 10*3/uL (ref 4.0–10.5)
nRBC: 0 % (ref 0.0–0.2)

## 2023-11-21 LAB — CMP (CANCER CENTER ONLY)
ALT: 23 U/L (ref 0–44)
AST: 15 U/L (ref 15–41)
Albumin: 4.1 g/dL (ref 3.5–5.0)
Alkaline Phosphatase: 85 U/L (ref 38–126)
Anion gap: 7 (ref 5–15)
BUN: 17 mg/dL (ref 6–20)
CO2: 28 mmol/L (ref 22–32)
Calcium: 9.1 mg/dL (ref 8.9–10.3)
Chloride: 103 mmol/L (ref 98–111)
Creatinine: 0.66 mg/dL (ref 0.44–1.00)
GFR, Estimated: >60 mL/min (ref >60)
Glucose, Bld: 110 mg/dL — ABNORMAL HIGH (ref 70–99)
Potassium: 3.4 mmol/L — ABNORMAL LOW (ref 3.5–5.1)
Sodium: 138 mmol/L (ref 135–145)
Total Bilirubin: 0.4 mg/dL (ref 0.0–1.2)
Total Protein: 7 g/dL (ref 6.5–8.1)

## 2023-11-21 MED ORDER — SODIUM CHLORIDE 0.9 % IV SOLN
194.8500 mg | Freq: Once | INTRAVENOUS | Status: AC
  Administered 2023-11-21: 190 mg via INTRAVENOUS
  Filled 2023-11-21: qty 19

## 2023-11-21 MED ORDER — DIPHENHYDRAMINE HCL 50 MG/ML IJ SOLN
50.0000 mg | Freq: Once | INTRAMUSCULAR | Status: AC
  Administered 2023-11-21: 50 mg via INTRAVENOUS
  Filled 2023-11-21: qty 1

## 2023-11-21 MED ORDER — FAMOTIDINE IN NACL 20-0.9 MG/50ML-% IV SOLN
20.0000 mg | Freq: Once | INTRAVENOUS | Status: AC
  Administered 2023-11-21: 20 mg via INTRAVENOUS
  Filled 2023-11-21: qty 50

## 2023-11-21 MED ORDER — HEPARIN SOD (PORK) LOCK FLUSH 100 UNIT/ML IV SOLN
500.0000 [IU] | Freq: Once | INTRAVENOUS | Status: AC | PRN
  Administered 2023-11-21: 500 [IU]

## 2023-11-21 MED ORDER — SODIUM CHLORIDE 0.9% FLUSH
10.0000 mL | INTRAVENOUS | Status: DC | PRN
  Administered 2023-11-21: 10 mL

## 2023-11-21 MED ORDER — SODIUM CHLORIDE 0.9 % IV SOLN
80.0000 mg/m2 | Freq: Once | INTRAVENOUS | Status: AC
  Administered 2023-11-21: 150 mg via INTRAVENOUS
  Filled 2023-11-21: qty 25

## 2023-11-21 MED ORDER — SODIUM CHLORIDE 0.9 % IV SOLN: INTRAVENOUS | Status: DC

## 2023-11-21 MED ORDER — SODIUM CHLORIDE 0.9% FLUSH
10.0000 mL | Freq: Once | INTRAVENOUS | Status: AC
  Administered 2023-11-21: 10 mL

## 2023-11-21 MED ORDER — DEXAMETHASONE SODIUM PHOSPHATE 10 MG/ML IJ SOLN
10.0000 mg | Freq: Once | INTRAMUSCULAR | Status: AC
  Administered 2023-11-21: 10 mg via INTRAVENOUS
  Filled 2023-11-21: qty 1

## 2023-11-21 MED ORDER — PALONOSETRON HCL INJECTION 0.25 MG/5ML
0.2500 mg | Freq: Once | INTRAVENOUS | Status: AC
  Administered 2023-11-21: 0.25 mg via INTRAVENOUS
  Filled 2023-11-21: qty 5

## 2023-11-21 NOTE — Research (Signed)
 Z6109, ICE COMPRESS: RANDOMIZED TRIAL OF LIMB CRYOCOMPRESSION VERSUS CONTINUOUS COMPRESSION VERSUS LOW CYCLIC COMPRESSION FOR THE PREVENTION OF TAXANE-INDUCED PERIPHERAL NEUROPATHY   Cycle 3: Patient arrived today accompanied by her daughter for the cycle 3 visit. Confirmed that patient does not have wounds, sores, or lesions on her extremities. Patient has not received any vaccinations since the last visit.  MD/PROVIDER VISIT: Patient was seen by Alwin Baars, NP, for today's visit. Per discussion with Alwin Baars, pt's increased port sensitivity in cold condition is not considered study-related.   ADVERSE EVENTS: Patient reports no changes since last visit. Adverse Event CTCAE Grade Onset date Resolved date Relationship to Study Intervention Action Taken Comments  Skin atrophy (solicited) 0   NA    Skin hyperpigmentation (solicited) 0   NA    Skin hypopigmentation (solicited) 0   NA    Skin induration (solicited) 0   NA    Skin ulceration (solicited) 0   NA    Rash maculopapular (solicited) 0   NA    Nail changes (solicited) 0   NA    Cold intolerance (solicited) (general disorders and administration site conditions- other) 0   NA    Frostbite (solicited) (skin and subcutaneous tissue disorders- other) 0   NA      STUDY INTERVENTION & TOLERABILITY ASSESSMENTS:  11:04 AM Wraps applied to all 4 limbs; pre-treatment started with starting pressure at 0-5 mmHg each cycle.  11:10 AM Study-required Device Tolerability Assessment (5-15 min) performed. Pt reported the device was tolerable.   11:38 AM Taxane treatment started. Device transitioned to treatment phase with starting pressure at 0-5 mmHg each cycle.   12:40 PM Taxane infusion completed. Device moved to post-treatment phase.  01:10 PM Wraps removed. Pt confirmed no new wounds, sores, or lesions.  Was the treatment paused for a bathroom break? No   DISPOSITION: Upon completion off all study requirements, patient was remained in  the infusion chair with RN Leontine Rana, at bedside. Her next treatment is scheduled for 11/28/2023. Pt has no questions or concerns at this time.  The patient was thanked for their time and continued voluntary participation in this study. [name] has been provided direct contact information and is encouraged to contact this Coordinator for any needs or questions.  Kiyonna Tortorelli, Ph.D. Clinical Research Coordinator [phone] 11/21/2023 1:47 PM

## 2023-11-21 NOTE — Assessment & Plan Note (Addendum)
 10/07/2023:Screening mammogram detected right breast mass at 12:00 measuring 5.1 cm, 1 axillary lymph node biopsy was benign biopsy of the mass grade 3 IDC with necrosis ER 30% weak, PR 0%, Ki67 60%, HER2 negative   Treatment plan: Neoadjuvant chemotherapy with Taxol  carbo Keytruda  followed by Adriamycin Cytoxan Keytruda  Breast conserving surgery with sentinel lymph node biopsy Adjuvant radiation therapy Antiestrogen therapy with the final path is estrogen receptor positive CT CAP and bone scan 11/04/2023: 4.7 cm right breast mass, right axillary lymph node 1.8 cm, 0.2 cm lung nodule benign Breast MRI 10/30/2023: Right breast malignancy 4.7 cm, enlarged right axillary lymph node ------------------------------------------------------------------------------------------------------------------------------------------------ Current treatment: Cycle 3 Taxol  carbo Keytruda  Chemo toxicities:  Return to clinic weekly for chemotherapy Assessment and Plan Assessment & Plan Stage IIIA breast cancer Stage IIIA breast cancer diagnosed in May 2025. Undergoing treatment with Taxol , Carbo, and Keytruda . Tolerating chemotherapy well with stable labs. - Administer Taxol  and Carbo today. - Continue Keytruda  every three weeks. - Return for chemotherapy next week.  Constipation Experiencing constipation post-chemotherapy, resolving after four days. - Continue bowel regimen  Low potassium Potassium level slightly low at 3.4. No hypokalemia symptoms reported. Advised dietary modifications. - Recommend potassium-rich foods such as bananas, melons, sweet potatoes, kiwi, and broccoli.

## 2023-11-21 NOTE — Patient Instructions (Signed)
 [facility] - A DEPT OF [place]. [facility]  Discharge Instructions: Thank you for choosing [facility] to provide your oncology and hematology care.   If you have a lab appointment with the Cancer Center, please go directly to the Cancer Center and check in at the registration area.   Wear comfortable clothing and clothing appropriate for easy access to any Portacath or PICC line.   We strive to give you quality time with your provider. You may need to reschedule your appointment if you arrive late (15 or more minutes).  Arriving late affects you and other patients whose appointments are after yours.  Also, if you miss three or more appointments without notifying the office, you may be dismissed from the clinic at the provider's discretion.      For prescription refill requests, have your pharmacy contact our office and allow 72 hours for refills to be completed.    Today you received the following chemotherapy and/or immunotherapy agents: Paclitaxel , carboplatin       To help prevent nausea and vomiting after your treatment, we encourage you to take your nausea medication as directed.  BELOW ARE SYMPTOMS THAT SHOULD BE REPORTED IMMEDIATELY: *FEVER GREATER THAN 100.4 F (38 C) OR HIGHER *CHILLS OR SWEATING *NAUSEA AND VOMITING THAT IS NOT CONTROLLED WITH YOUR NAUSEA MEDICATION *UNUSUAL SHORTNESS OF BREATH *UNUSUAL BRUISING OR BLEEDING *URINARY PROBLEMS (pain or burning when urinating, or frequent urination) *BOWEL PROBLEMS (unusual diarrhea, constipation, pain near the anus) TENDERNESS IN MOUTH AND THROAT WITH OR WITHOUT PRESENCE OF ULCERS (sore throat, sores in mouth, or a toothache) UNUSUAL RASH, SWELLING OR PAIN  UNUSUAL VAGINAL DISCHARGE OR ITCHING   Items with * indicate a potential emergency and should be followed up as soon as possible or go to the Emergency Department if any problems should occur.  Please show the CHEMOTHERAPY ALERT CARD or  IMMUNOTHERAPY ALERT CARD at check-in to the Emergency Department and triage nurse.  Should you have questions after your visit or need to cancel or reschedule your appointment, please contact [facility] - A DEPT OF [facility]  Dept: [phone]  and follow the prompts.  Office hours are 8:00 a.m. to 4:30 p.m. Monday - Friday. Please note that voicemails left after 4:00 p.m. may not be returned until the following business day.  We are closed weekends and major holidays. You have access to a nurse at all times for urgent questions. Please call the main number to the clinic Dept: [phone] and follow the prompts.   For any non-urgent questions, you may also contact your provider using MyChart. We now offer e-Visits for anyone 17 and older to request care online for non-urgent symptoms. For details visit [email or web address].   Also download the MyChart app! Go to the app store, search MyChart, open the app, select [facility], and log in with your MyChart username and password.  Pembrolizumab  Injection What is this medication? PEMBROLIZUMAB  (PEM broe LIZ ue mab) treats some types of cancer. It works by helping your immune system slow or stop the spread of cancer cells. It is a monoclonal antibody. This medicine may be used for other purposes; ask your health care provider or pharmacist if you have questions. COMMON BRAND NAME(S): Keytruda  What should I tell my care team before I take this medication? They need to know if you have any of these conditions: Allogeneic stem cell transplant (uses someone else's stem  cells) Autoimmune diseases, such as Crohn disease, ulcerative colitis, lupus History of chest radiation Nervous system problems, such as Guillain-Barre syndrome, myasthenia gravis Organ transplant An unusual or allergic reaction to pembrolizumab , other medications, foods, dyes, or preservatives Pregnant or trying to get  pregnant Breast-feeding How should I use this medication? This medication is injected into a vein. It is given by your care team in a [facility] or clinic setting. A special MedGuide will be given to you before each treatment. Be sure to read this information carefully each time. Talk to your care team about the use of this medication in children. While it may be prescribed for children as young as 6 months for selected conditions, precautions do apply. Overdosage: If you think you have taken too much of this medicine contact a poison control center or emergency room at once. NOTE: This medicine is only for you. Do not share this medicine with others. What if I miss a dose? Keep appointments for follow-up doses. It is important not to miss your dose. Call your care team if you are unable to keep an appointment. What may interact with this medication? Interactions have not been studied. This list may not describe all possible interactions. Give your health care provider a list of all the medicines, herbs, non-prescription drugs, or dietary supplements you use. Also tell them if you smoke, drink alcohol, or use illegal drugs. Some items may interact with your medicine. What should I watch for while using this medication? Your condition will be monitored carefully while you are receiving this medication. You may need blood work while taking this medication. This medication may cause serious skin reactions. They can happen weeks to months after starting the medication. Contact your care team right away if you notice fevers or flu-like symptoms with a rash. The rash may be red or purple and then turn into blisters or peeling of the skin. You may also notice a red rash with swelling of the face, lips, or lymph nodes in your neck or under your arms. Tell your care team right away if you have any change in your eyesight. Talk to your care team if you may be pregnant. Serious birth defects can occur if you  take this medication during pregnancy and for 4 months after the last dose. You will need a negative pregnancy test before starting this medication. Contraception is recommended while taking this medication and for 4 months after the last dose. Your care team can help you find the option that works for you. Do not breastfeed while taking this medication and for 4 months after the last dose. What side effects may I notice from receiving this medication? Side effects that you should report to your care team as soon as possible: Allergic reactions--skin rash, itching, hives, swelling of the face, lips, tongue, or throat Dry cough, shortness of breath or trouble breathing Eye pain, redness, irritation, or discharge with blurry or decreased vision Heart muscle inflammation--unusual weakness or fatigue, shortness of breath, chest pain, fast or irregular heartbeat, dizziness, swelling of the ankles, feet, or hands Hormone gland problems--headache, sensitivity to light, unusual weakness or fatigue, dizziness, fast or irregular heartbeat, increased sensitivity to cold or heat, excessive sweating, constipation, hair loss, increased thirst or amount of urine, tremors or shaking, irritability Infusion reactions--chest pain, shortness of breath or trouble breathing, feeling faint or lightheaded Kidney injury (glomerulonephritis)--decrease in the amount of urine, red or dark brown urine, foamy or bubbly urine, swelling of the ankles, hands, or  feet Liver injury--right upper belly pain, loss of appetite, nausea, light-colored stool, dark yellow or brown urine, yellowing skin or eyes, unusual weakness or fatigue Pain, tingling, or numbness in the hands or feet, muscle weakness, change in vision, confusion or trouble speaking, loss of balance or coordination, trouble walking, seizures Rash, fever, and swollen lymph nodes Redness, blistering, peeling, or loosening of the skin, including inside the mouth Sudden or  severe stomach pain, bloody diarrhea, fever, nausea, vomiting Side effects that usually do not require medical attention (report to your care team if they continue or are bothersome): Bone, joint, or muscle pain Diarrhea Fatigue Loss of appetite Nausea Skin rash This list may not describe all possible side effects. Call your doctor for medical advice about side effects. You may report side effects to FDA at 1-800-FDA-1088. Where should I keep my medication? This medication is given in a [facility] or clinic. It will not be stored at home. NOTE: This sheet is a summary. It may not cover all possible information. If you have questions about this medicine, talk to your doctor, pharmacist, or health care provider.  2024 Elsevier/Gold Standard (2021-10-02 00:00:00) Paclitaxel  Injection What is this medication? PACLITAXEL  (PAK li TAX el) treats some types of cancer. It works by slowing down the growth of cancer cells. This medicine may be used for other purposes; ask your health care provider or pharmacist if you have questions. COMMON BRAND NAME(S): Onxol, Taxol  What should I tell my care team before I take this medication? They need to know if you have any of these conditions: Heart disease Liver disease Low white blood cell levels An unusual or allergic reaction to paclitaxel , other medications, foods, dyes, or preservatives If you or your partner are pregnant or trying to get pregnant Breast-feeding How should I use this medication? This medication is injected into a vein. It is given by your care team in a [facility] or clinic setting. Talk to your care team about the use of this medication in children. While it may be given to children for selected conditions, precautions do apply. Overdosage: If you think you have taken too much of this medicine contact a poison control center or emergency room at once. NOTE: This medicine is only for you. Do not share this medicine with others. What if  I miss a dose? Keep appointments for follow-up doses. It is important not to miss your dose. Call your care team if you are unable to keep an appointment. What may interact with this medication? Do not take this medication with any of the following: Live virus vaccines Other medications may affect the way this medication works. Talk with your care team about all of the medications you take. They may suggest changes to your treatment plan to lower the risk of side effects and to make sure your medications work as intended. This list may not describe all possible interactions. Give your health care provider a list of all the medicines, herbs, non-prescription drugs, or dietary supplements you use. Also tell them if you smoke, drink alcohol, or use illegal drugs. Some items may interact with your medicine. What should I watch for while using this medication? Your condition will be monitored carefully while you are receiving this medication. You may need blood work while taking this medication. This medication may make you feel generally unwell. This is not uncommon as chemotherapy can affect healthy cells as well as cancer cells. Report any side effects. Continue your course of treatment even though you  feel ill unless your care team tells you to stop. This medication can cause serious allergic reactions. To reduce the risk, your care team may give you other medications to take before receiving this one. Be sure to follow the directions from your care team. This medication may increase your risk of getting an infection. Call your care team for advice if you get a fever, chills, sore throat, or other symptoms of a cold or flu. Do not treat yourself. Try to avoid being around people who are sick. This medication may increase your risk to bruise or bleed. Call your care team if you notice any unusual bleeding. Be careful brushing or flossing your teeth or using a toothpick because you may get an infection or  bleed more easily. If you have any dental work done, tell your dentist you are receiving this medication. Talk to your care team if you may be pregnant. Serious birth defects can occur if you take this medication during pregnancy. Talk to your care team before breastfeeding. Changes to your treatment plan may be needed. What side effects may I notice from receiving this medication? Side effects that you should report to your care team as soon as possible: Allergic reactions--skin rash, itching, hives, swelling of the face, lips, tongue, or throat Heart rhythm changes--fast or irregular heartbeat, dizziness, feeling faint or lightheaded, chest pain, trouble breathing Increase in blood pressure Infection--fever, chills, cough, sore throat, wounds that don't heal, pain or trouble when passing urine, general feeling of discomfort or being unwell Low blood pressure--dizziness, feeling faint or lightheaded, blurry vision Low red blood cell level--unusual weakness or fatigue, dizziness, headache, trouble breathing Painful swelling, warmth, or redness of the skin, blisters or sores at the infusion site Pain, tingling, or numbness in the hands or feet Slow heartbeat--dizziness, feeling faint or lightheaded, confusion, trouble breathing, unusual weakness or fatigue Unusual bruising or bleeding Side effects that usually do not require medical attention (report to your care team if they continue or are bothersome): Diarrhea Hair loss Joint pain Loss of appetite Muscle pain Nausea Vomiting This list may not describe all possible side effects. Call your doctor for medical advice about side effects. You may report side effects to FDA at 1-800-FDA-1088. Where should I keep my medication? This medication is given in a [facility] or clinic. It will not be stored at home. NOTE: This sheet is a summary. It may not cover all possible information. If you have questions about this medicine, talk to your doctor,  pharmacist, or health care provider.  2024 Elsevier/Gold Standard (2021-10-09 00:00:00) Carboplatin  Injection What is this medication? CARBOPLATIN  (KAR boe pla tin) treats some types of cancer. It works by slowing down the growth of cancer cells. This medicine may be used for other purposes; ask your health care provider or pharmacist if you have questions. COMMON BRAND NAME(S): Paraplatin  What should I tell my care team before I take this medication? They need to know if you have any of these conditions: Blood disorders Hearing problems Kidney disease Recent or ongoing radiation therapy An unusual or allergic reaction to carboplatin , cisplatin, other medications, foods, dyes, or preservatives Pregnant or trying to get pregnant Breast-feeding How should I use this medication? This medication is injected into a vein. It is given by your care team in a [facility] or clinic setting. Talk to your care team about the use of this medication in children. Special care may be needed. Overdosage: If you think you have taken too much of this  medicine contact a poison control center or emergency room at once. NOTE: This medicine is only for you. Do not share this medicine with others. What if I miss a dose? Keep appointments for follow-up doses. It is important not to miss your dose. Call your care team if you are unable to keep an appointment. What may interact with this medication? Medications for seizures Some antibiotics, such as amikacin, gentamicin, neomycin, streptomycin, tobramycin Vaccines This list may not describe all possible interactions. Give your health care provider a list of all the medicines, herbs, non-prescription drugs, or dietary supplements you use. Also tell them if you smoke, drink alcohol, or use illegal drugs. Some items may interact with your medicine. What should I watch for while using this medication? Your condition will be monitored carefully while you are receiving  this medication. You may need blood work while taking this medication. This medication may make you feel generally unwell. This is not uncommon, as chemotherapy can affect healthy cells as well as cancer cells. Report any side effects. Continue your course of treatment even though you feel ill unless your care team tells you to stop. In some cases, you may be given additional medications to help with side effects. Follow all directions for their use. This medication may increase your risk of getting an infection. Call your care team for advice if you get a fever, chills, sore throat, or other symptoms of a cold or flu. Do not treat yourself. Try to avoid being around people who are sick. Avoid taking medications that contain aspirin, acetaminophen , ibuprofen, naproxen, or ketoprofen unless instructed by your care team. These medications may hide a fever. Be careful brushing or flossing your teeth or using a toothpick because you may get an infection or bleed more easily. If you have any dental work done, tell your dentist you are receiving this medication. Talk to your care team if you wish to become pregnant or think you might be pregnant. This medication can cause serious birth defects. Talk to your care team about effective forms of contraception. Do not breast-feed while taking this medication. What side effects may I notice from receiving this medication? Side effects that you should report to your care team as soon as possible: Allergic reactions--skin rash, itching, hives, swelling of the face, lips, tongue, or throat Infection--fever, chills, cough, sore throat, wounds that don't heal, pain or trouble when passing urine, general feeling of discomfort or being unwell Low red blood cell level--unusual weakness or fatigue, dizziness, headache, trouble breathing Pain, tingling, or numbness in the hands or feet, muscle weakness, change in vision, confusion or trouble speaking, loss of balance or  coordination, trouble walking, seizures Unusual bruising or bleeding Side effects that usually do not require medical attention (report to your care team if they continue or are bothersome): Hair loss Nausea Unusual weakness or fatigue Vomiting This list may not describe all possible side effects. Call your doctor for medical advice about side effects. You may report side effects to FDA at 1-800-FDA-1088. Where should I keep my medication? This medication is given in a [facility] or clinic. It will not be stored at home. NOTE: This sheet is a summary. It may not cover all possible information. If you have questions about this medicine, talk to your doctor, pharmacist, or health care provider.  2024 Elsevier/Gold Standard (2021-09-11 00:00:00)

## 2023-11-21 NOTE — Progress Notes (Signed)
 [facility] Cancer Follow up:    Gabrielle Piggs, NP [address] [place] [address]   DIAGNOSIS:  Cancer Staging  Malignant neoplasm of upper-outer quadrant of right breast in female, estrogen receptor positive (HCC) Staging form: Breast, AJCC 8th Edition - Clinical stage from 10/22/2023: Stage IIIA (cT3, cN0, cM0, G3, ER+, PR-, HER2-) - Signed by Cameron Cea, MD on 10/22/2023 Stage prefix: Initial diagnosis Histologic grading system: 3 grade system Laterality: Right Staged by: Pathologist and managing physician Stage used in treatment planning: Yes National guidelines used in treatment planning: Yes Type of national guideline used in treatment planning: NCCN    SUMMARY OF ONCOLOGIC HISTORY: Oncology History  Malignant neoplasm of upper-outer quadrant of right breast in female, estrogen receptor positive (HCC)  10/07/2023 Initial Diagnosis   Screening mammogram detected right breast mass at 12:00 measuring 5.1 cm, 1 axillary lymph node biopsy was benign biopsy of the mass grade 3 IDC with necrosis ER 30% weak, PR 0%, Ki67 60%, HER2 negative   10/22/2023 Cancer Staging   Staging form: Breast, AJCC 8th Edition - Clinical stage from 10/22/2023: Stage IIIA (cT3, cN0, cM0, G3, ER+, PR-, HER2-) - Signed by Cameron Cea, MD on 10/22/2023 Stage prefix: Initial diagnosis Histologic grading system: 3 grade system Laterality: Right Staged by: Pathologist and managing physician Stage used in treatment planning: Yes National guidelines used in treatment planning: Yes Type of national guideline used in treatment planning: NCCN   11/02/2023 Genetic Testing   Negative [facility] CancerNext +RNAinsight Panel.  VUS in BARD1 at p.K438E (c.1312A>G).  Report date is 11/02/2023.   The [facility] CancerNext+RNAinsight Panel includes sequencing, rearrangement analysis, and RNA analysis for the following 40 genes: APC, ATM, BAP1, BARD1, BMPR1A, BRCA1, BRCA2, BRIP1, CDH1, CDKN2A,  CHEK2, FH, FLCN, MET, MLH1, MSH2, MSH6, MUTYH, NF1, NTHL1, PALB2, PMS2, PTEN, RAD51C, RAD51D, RSP20, SMAD4, STK11, TP53, TSC1, TSC2, and VHL (sequencing and deletion/duplication); AXIN2, HOXB13, MBD4, MSH3, POLD1 and POLE (sequencing only); EPCAM and GREM1 (deletion/duplication only).   11/06/2023 -  Chemotherapy   Patient is on Treatment Plan : BREAST Pembrolizumab  (200) D1 + Carboplatin  (1.5) D1,8,15 + Paclitaxel  (80) D1,8,15 q21d X 4 cycles / Pembrolizumab  (200) D1 + AC D1 q21d x 4 cycles       CURRENT THERAPY: Taxol /carbo/ketyrda  INTERVAL HISTORY:  Discussed the use of AI scribe software for clinical note transcription with the patient, who gave verbal consent to proceed.  History of Present Illness Gabrielle Cross is a 58 year old female with stage IIIA breast cancer who presents for follow-up of her ongoing treatment.  She is undergoing treatment with Taxol  and Carbo weekly, and Keytruda  every three weeks. She is scheduled for Taxol  and Carbo today. Constipation is the only side effect, lasting about four days before resolving. There is no numbness or tingling in her extremities. Her port is more sensitive in cold conditions, causing discomfort but not pain. She has no fever or symptoms of infection.  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 Her work schedule is adjusted to accommodate her treatment, working three days in the office and one day from home. She has no fever, chills, nausea, vomiting, cough, shortness of breath, or taste changes.     Patient Active Problem List   Diagnosis Date Noted   Port-A-Cath in place 11/13/2023   Genetic testing 11/04/2023   Malignant neoplasm of upper-outer quadrant of  right breast in female, estrogen receptor positive (HCC) 10/20/2023   Need for hepatitis C screening test 09/17/2022   Dyslipidemia, goal LDL below 130 09/17/2022   Acne rosacea 08/23/2022    Inflamed seborrheic keratosis 08/23/2022   Keratosis pilaris 08/23/2022   Vitamin D  deficiency 04/19/2021   Loud snoring 04/18/2021   Primary hypertension 04/18/2021   Encounter for general adult medical examination with abnormal findings 04/18/2021   Visit for screening mammogram 04/18/2021   Erythrocytosis 02/22/2021   Squamous cell cancer of skin of left cheek 03/24/2019   Migraines 01/09/2012    is allergic to shellfish allergy.  MEDICAL HISTORY: Past Medical History:  Diagnosis Date   Arthritis    knees   Blood transfusion without reported diagnosis 2002   Carpal tunnel syndrome, bilateral    Elevated hemoglobin (HCC)    Hypertension    Incompetent cervix    DELIVERED AT 31 WEEKS   Migraines    OSA (obstructive sleep apnea)    Rx for autopap, does not use   Ruptured ectopic pregnancy 2002   WITH BLOOD TRANSFUSION 2U'S PRBC'S   Wears glasses     SURGICAL HISTORY: Past Surgical History:  Procedure Laterality Date   BREAST BIOPSY Right 10/07/2023   US  RT BREAST BX W LOC DEV 1ST LESION IMG BX SPEC US  GUIDE 10/07/2023 [facility] MAMMOGRAPHY   CERCLAGE PLACEMENT     DILATATION & CURETTAGE/HYSTEROSCOPY WITH MYOSURE N/A 04/19/2022   Procedure: DILATATION & CURETTAGE/HYSTEROSCOPY WITH MYOSURE;  Surgeon: Lavoie, Marie-Lyne, MD;  Location: [facility];  Service: Gynecology;  Laterality: N/A;   IR IMAGING GUIDED PORT INSERTION  11/10/2023   PORTACATH PLACEMENT Left 11/05/2023   Procedure: ATTEMPTED INSERTION, TUNNELED CENTRAL VENOUS DEVICE, WITH PORT;  Surgeon: Caralyn Chandler, MD;  Location: [facility];  Service: General;  Laterality: Left;  PORT PLACEMENT WITH ULTRASOUND GUIDANCE   RESECTOSCOPIC POLYPECTOMY  2001   SALPINGECTOMY Right    RIGHT, RUPTURED ECTOPIC   SKIN CANCER EXCISION  2020   tangential bx  02/22/2019    SOCIAL HISTORY: Social History   Socioeconomic History   Marital status: Married    Spouse name: Not on file   Number of  children: Not on file   Years of education: Not on file   Highest education level: Not on file  Occupational History   Not on file  Tobacco Use   Smoking status: Never    Passive exposure: Never   Smokeless tobacco: Never  Vaping Use   Vaping status: Never Used  Substance and Sexual Activity   Alcohol use: Yes    Comment: weekends - social    Drug use: No   Sexual activity: Yes    Partners: Male    Birth control/protection: Post-menopausal    Comment: intercoure age 79, less than 5 sexual partners, des neg  Other Topics Concern   Not on file  Social History Narrative   Not on file   Social Drivers of Health   Financial Resource Strain: Not on file  Food Insecurity: No Food Insecurity (10/22/2023)   Hunger Vital Sign    Worried About Running Out of Food in the Last Year: Never true    Ran Out of Food in the Last Year: Never true  Transportation Needs: No Transportation Needs (10/22/2023)   PRAPARE - Administrator, Civil Service (Medical): No    Lack of Transportation (Non-Medical): No  Physical Activity: Not on file  Stress: Not on file  Social Connections: Not on file  Intimate Partner Violence: Not At Risk (10/22/2023)   Humiliation, Afraid, Rape, and Kick questionnaire    Fear of Current or Ex-Partner: No    Emotionally Abused: No    Physically Abused: No    Sexually Abused: No    FAMILY HISTORY: Family History  Problem Relation Age of Onset   Colon polyps Mother    Hyperlipidemia Father    Colon polyps Sister    Colon polyps Brother    Colon cancer Neg Hx    Esophageal cancer Neg Hx    Rectal cancer Neg Hx    Stomach cancer Neg Hx    Sleep apnea Neg Hx     Review of Systems  Constitutional:  Negative for appetite change, chills, fatigue, fever and unexpected weight change.  HENT:   Negative for hearing loss, lump/mass and trouble swallowing.   Eyes:  Negative for eye problems and icterus.  Respiratory:  Negative for chest tightness, cough  and shortness of breath.   Cardiovascular:  Negative for chest pain, leg swelling and palpitations.  Gastrointestinal:  Positive for constipation. Negative for abdominal distention, abdominal pain, diarrhea, nausea and vomiting.  Endocrine: Negative for hot flashes.  Genitourinary:  Negative for difficulty urinating.   Musculoskeletal:  Negative for arthralgias.  Skin:  Negative for itching and rash.  Neurological:  Negative for dizziness, extremity weakness, headaches and numbness.  Hematological:  Negative for adenopathy. Does not bruise/bleed easily.  Psychiatric/Behavioral:  Negative for depression. The patient is not nervous/anxious.       PHYSICAL EXAMINATION    Vitals:   11/21/23 0929  BP: 131/74  Pulse: 99  Resp: 18  Temp: 98.8 F (37.1 C)  SpO2: 97%    Physical Exam Constitutional:      General: She is not in acute distress.    Appearance: Normal appearance. She is not toxic-appearing.  HENT:     Head: Normocephalic and atraumatic.     Mouth/Throat:     Mouth: Mucous membranes are moist.     Pharynx: Oropharynx is clear. No oropharyngeal exudate or posterior oropharyngeal erythema.   Eyes:     General: No scleral icterus.   Cardiovascular:     Rate and Rhythm: Normal rate and regular rhythm.     Pulses: Normal pulses.     Heart sounds: Normal heart sounds.  Pulmonary:     Effort: Pulmonary effort is normal.     Breath sounds: Normal breath sounds.  Abdominal:     General: Abdomen is flat. Bowel sounds are normal. There is no distension.     Palpations: Abdomen is soft.     Tenderness: There is no abdominal tenderness.   Musculoskeletal:        General: No swelling.     Cervical back: Neck supple.  Lymphadenopathy:     Cervical: No cervical adenopathy.   Skin:    General: Skin is warm and dry.     Findings: No rash.   Neurological:     General: No focal deficit present.     Mental Status: She is alert.   Psychiatric:        Mood and Affect:  Mood normal.        Behavior: Behavior normal.     LABORATORY DATA:  CBC    Component Value Date/Time   WBC 4.6 11/21/2023 0856   WBC 5.6 09/17/2022 0830   RBC 4.79 11/21/2023 0856   HGB 13.4 11/21/2023 0856   HCT 39.7  11/21/2023 0856   PLT 362 11/21/2023 0856   MCV 82.9 11/21/2023 0856   MCH 28.0 11/21/2023 0856   MCHC 33.8 11/21/2023 0856   RDW 13.9 11/21/2023 0856   LYMPHSABS 1.7 11/21/2023 0856   MONOABS 0.4 11/21/2023 0856   EOSABS 0.0 11/21/2023 0856   BASOSABS 0.0 11/21/2023 0856    CMP     Component Value Date/Time   NA 138 11/21/2023 0856   K 3.4 (L) 11/21/2023 0856   CL 103 11/21/2023 0856   CO2 28 11/21/2023 0856   GLUCOSE 110 (H) 11/21/2023 0856   BUN 17 11/21/2023 0856   CREATININE 0.66 11/21/2023 0856   CREATININE 0.89 09/05/2020 0857   CALCIUM 9.1 11/21/2023 0856   PROT 7.0 11/21/2023 0856   ALBUMIN 4.1 11/21/2023 0856   AST 15 11/21/2023 0856   ALT 23 11/21/2023 0856   ALKPHOS 85 11/21/2023 0856   BILITOT 0.4 11/21/2023 0856   GFRNONAA >60 11/21/2023 0856     ASSESSMENT and THERAPY PLAN:   Malignant neoplasm of upper-outer quadrant of right breast in female, estrogen receptor positive (HCC) 10/07/2023:Screening mammogram detected right breast mass at 12:00 measuring 5.1 cm, 1 axillary lymph node biopsy was benign biopsy of the mass grade 3 IDC with necrosis ER 30% weak, PR 0%, Ki67 60%, HER2 negative   Treatment plan: Neoadjuvant chemotherapy with Taxol  carbo Keytruda  followed by Adriamycin Cytoxan Keytruda  Breast conserving surgery with sentinel lymph node biopsy Adjuvant radiation therapy Antiestrogen therapy with the final path is estrogen receptor positive CT CAP and bone scan 11/04/2023: 4.7 cm right breast mass, right axillary lymph node 1.8 cm, 0.2 cm lung nodule benign Breast MRI 10/30/2023: Right breast malignancy 4.7 cm, enlarged right axillary lymph  node ------------------------------------------------------------------------------------------------------------------------------------------------ Current treatment: Cycle 3 Taxol  carbo Keytruda  Chemo toxicities:  Return to clinic weekly for chemotherapy Assessment and Plan Assessment & Plan Stage IIIA breast cancer Stage IIIA breast cancer diagnosed in May 2025. Undergoing treatment with Taxol , Carbo, and Keytruda . Tolerating chemotherapy well with stable labs. - Administer Taxol  and Carbo today. - Continue Keytruda  every three weeks. - Return for chemotherapy next week.  Constipation Experiencing constipation post-chemotherapy, resolving after four days. - Continue bowel regimen  Low potassium Potassium level slightly low at 3.4. No hypokalemia symptoms reported. Advised dietary modifications. - Recommend potassium-rich foods such as bananas, melons, sweet potatoes, kiwi, and broccoli.    All questions were answered. The patient knows to call the clinic with any problems, questions or concerns. We can certainly see the patient much sooner if necessary.  Total encounter time:20 minutes*in face-to-face visit time, chart review, lab review, care coordination, order entry, and documentation of the encounter time.    Alwin Baars, NP 11/21/23 10:01 AM Medical Oncology and Hematology [facility] [address] [place], [place] [address] Tel. [phone]    Fax. [phone]  *Total Encounter Time as defined by the Centers for Medicare and Medicaid Services includes, in addition to the face-to-face time of a patient visit (documented in the note above) non-face-to-face time: obtaining and reviewing outside history, ordering and reviewing medications, tests or procedures, care coordination (communications with other health care professionals or caregivers) and documentation in the medical record.

## 2023-11-24 ENCOUNTER — Encounter: Admitting: Internal Medicine

## 2023-11-26 ENCOUNTER — Ambulatory Visit: Admitting: Radiology

## 2023-11-27 ENCOUNTER — Ambulatory Visit: Admitting: Hematology and Oncology

## 2023-11-27 ENCOUNTER — Other Ambulatory Visit

## 2023-11-28 ENCOUNTER — Encounter: Payer: Self-pay | Admitting: Hematology and Oncology

## 2023-11-28 ENCOUNTER — Ambulatory Visit

## 2023-11-28 ENCOUNTER — Inpatient Hospital Stay

## 2023-11-28 VITALS — BP 124/73 | HR 97 | Temp 98.6°F | Resp 18 | Wt 180.5 lb

## 2023-11-28 DIAGNOSIS — Z1722 Progesterone receptor negative status: Secondary | ICD-10-CM | POA: Diagnosis not present

## 2023-11-28 DIAGNOSIS — Z79620 Long term (current) use of immunosuppressive biologic: Secondary | ICD-10-CM | POA: Diagnosis not present

## 2023-11-28 DIAGNOSIS — Z1732 Human epidermal growth factor receptor 2 negative status: Secondary | ICD-10-CM | POA: Diagnosis not present

## 2023-11-28 DIAGNOSIS — Z5111 Encounter for antineoplastic chemotherapy: Secondary | ICD-10-CM | POA: Diagnosis not present

## 2023-11-28 DIAGNOSIS — C50411 Malignant neoplasm of upper-outer quadrant of right female breast: Secondary | ICD-10-CM

## 2023-11-28 DIAGNOSIS — Z170 Estrogen receptor positive status [ER+]: Secondary | ICD-10-CM | POA: Diagnosis not present

## 2023-11-28 DIAGNOSIS — Z95828 Presence of other vascular implants and grafts: Secondary | ICD-10-CM

## 2023-11-28 DIAGNOSIS — Z5112 Encounter for antineoplastic immunotherapy: Secondary | ICD-10-CM | POA: Diagnosis not present

## 2023-11-28 LAB — CMP (CANCER CENTER ONLY)
ALT: 24 U/L (ref 0–44)
AST: 17 U/L (ref 15–41)
Albumin: 4.1 g/dL (ref 3.5–5.0)
Alkaline Phosphatase: 77 U/L (ref 38–126)
Anion gap: 8 (ref 5–15)
BUN: 15 mg/dL (ref 6–20)
CO2: 28 mmol/L (ref 22–32)
Calcium: 9.1 mg/dL (ref 8.9–10.3)
Chloride: 102 mmol/L (ref 98–111)
Creatinine: 0.64 mg/dL (ref 0.44–1.00)
GFR, Estimated: >60 mL/min (ref >60)
Glucose, Bld: 132 mg/dL — ABNORMAL HIGH (ref 70–99)
Potassium: 3.6 mmol/L (ref 3.5–5.1)
Sodium: 138 mmol/L (ref 135–145)
Total Bilirubin: 0.3 mg/dL (ref 0.0–1.2)
Total Protein: 6.8 g/dL (ref 6.5–8.1)

## 2023-11-28 LAB — CBC WITH DIFFERENTIAL (CANCER CENTER ONLY)
Abs Immature Granulocytes: 0.05 10*3/uL (ref 0.00–0.07)
Basophils Absolute: 0 10*3/uL (ref 0.0–0.1)
Basophils Relative: 0 %
Eosinophils Absolute: 0 10*3/uL (ref 0.0–0.5)
Eosinophils Relative: 0 %
HCT: 37.2 % (ref 36.0–46.0)
Hemoglobin: 12.6 g/dL (ref 12.0–15.0)
Immature Granulocytes: 1 %
Lymphocytes Relative: 36 %
Lymphs Abs: 1.7 10*3/uL (ref 0.7–4.0)
MCH: 27.9 pg (ref 26.0–34.0)
MCHC: 33.9 g/dL (ref 30.0–36.0)
MCV: 82.5 fL (ref 80.0–100.0)
Monocytes Absolute: 0.4 10*3/uL (ref 0.1–1.0)
Monocytes Relative: 9 %
Neutro Abs: 2.6 10*3/uL (ref 1.7–7.7)
Neutrophils Relative %: 54 %
Platelet Count: 365 10*3/uL (ref 150–400)
RBC: 4.51 MIL/uL (ref 3.87–5.11)
RDW: 14 % (ref 11.5–15.5)
WBC Count: 4.8 10*3/uL (ref 4.0–10.5)
nRBC: 0 % (ref 0.0–0.2)

## 2023-11-28 MED ORDER — SODIUM CHLORIDE 0.9 % IV SOLN
80.0000 mg/m2 | Freq: Once | INTRAVENOUS | Status: AC
  Administered 2023-11-28: 150 mg via INTRAVENOUS
  Filled 2023-11-28: qty 25

## 2023-11-28 MED ORDER — SODIUM CHLORIDE 0.9% FLUSH
10.0000 mL | Freq: Once | INTRAVENOUS | Status: AC
  Administered 2023-11-28: 10 mL

## 2023-11-28 MED ORDER — FAMOTIDINE IN NACL 20-0.9 MG/50ML-% IV SOLN
20.0000 mg | Freq: Once | INTRAVENOUS | Status: AC
  Administered 2023-11-28: 20 mg via INTRAVENOUS
  Filled 2023-11-28: qty 50

## 2023-11-28 MED ORDER — HEPARIN SOD (PORK) LOCK FLUSH 100 UNIT/ML IV SOLN
500.0000 [IU] | Freq: Once | INTRAVENOUS | Status: AC | PRN
  Administered 2023-11-28: 500 [IU]

## 2023-11-28 MED ORDER — SODIUM CHLORIDE 0.9% FLUSH
10.0000 mL | INTRAVENOUS | Status: DC | PRN
  Administered 2023-11-28: 10 mL

## 2023-11-28 MED ORDER — DIPHENHYDRAMINE HCL 50 MG/ML IJ SOLN
50.0000 mg | Freq: Once | INTRAMUSCULAR | Status: AC
  Administered 2023-11-28: 50 mg via INTRAVENOUS
  Filled 2023-11-28: qty 1

## 2023-11-28 MED ORDER — SODIUM CHLORIDE 0.9 % IV SOLN
200.0000 mg | Freq: Once | INTRAVENOUS | Status: AC
  Administered 2023-11-28: 200 mg via INTRAVENOUS
  Filled 2023-11-28: qty 200

## 2023-11-28 MED ORDER — SODIUM CHLORIDE 0.9 % IV SOLN: INTRAVENOUS | Status: DC

## 2023-11-28 MED ORDER — SODIUM CHLORIDE 0.9 % IV SOLN
194.8500 mg | Freq: Once | INTRAVENOUS | Status: AC
  Administered 2023-11-28: 190 mg via INTRAVENOUS
  Filled 2023-11-28: qty 19

## 2023-11-28 MED ORDER — PALONOSETRON HCL INJECTION 0.25 MG/5ML
0.2500 mg | Freq: Once | INTRAVENOUS | Status: AC
  Administered 2023-11-28: 0.25 mg via INTRAVENOUS
  Filled 2023-11-28: qty 5

## 2023-11-28 MED ORDER — DEXAMETHASONE SODIUM PHOSPHATE 10 MG/ML IJ SOLN
10.0000 mg | Freq: Once | INTRAMUSCULAR | Status: AC
  Administered 2023-11-28: 10 mg via INTRAVENOUS
  Filled 2023-11-28: qty 1

## 2023-11-28 NOTE — Patient Instructions (Signed)
 [facility] - A DEPT OF [facility]  Discharge Instructions: Thank you for choosing [facility] to provide your oncology and hematology care.   If you have a lab appointment with the Cancer Center, please go directly to the Cancer Center and check in at the registration area.   Wear comfortable clothing and clothing appropriate for easy access to any Portacath or PICC line.   We strive to give you quality time with your provider. You may need to reschedule your appointment if you arrive late (15 or more minutes).  Arriving late affects you and other patients whose appointments are after yours.  Also, if you miss three or more appointments without notifying the office, you may be dismissed from the clinic at the provider's discretion.      For prescription refill requests, have your pharmacy contact our office and allow 72 hours for refills to be completed.    Today you received the following chemotherapy and/or immunotherapy agents: Keytruda/Taxol/Carboplatin      To help prevent nausea and vomiting after your treatment, we encourage you to take your nausea medication as directed.  BELOW ARE SYMPTOMS THAT SHOULD BE REPORTED IMMEDIATELY: *FEVER GREATER THAN 100.4 F (38 C) OR HIGHER *CHILLS OR SWEATING *NAUSEA AND VOMITING THAT IS NOT CONTROLLED WITH YOUR NAUSEA MEDICATION *UNUSUAL SHORTNESS OF BREATH *UNUSUAL BRUISING OR BLEEDING *URINARY PROBLEMS (pain or burning when urinating, or frequent urination) *BOWEL PROBLEMS (unusual diarrhea, constipation, pain near the anus) TENDERNESS IN MOUTH AND THROAT WITH OR WITHOUT PRESENCE OF ULCERS (sore throat, sores in mouth, or a toothache) UNUSUAL RASH, SWELLING OR PAIN  UNUSUAL VAGINAL DISCHARGE OR ITCHING   Items with * indicate a potential emergency and should be followed up as soon as possible or go to the Emergency Department if any problems should occur.  Please show the CHEMOTHERAPY ALERT CARD  or IMMUNOTHERAPY ALERT CARD at check-in to the Emergency Department and triage nurse.  Should you have questions after your visit or need to cancel or reschedule your appointment, please contact [facility] - A DEPT OF [facility]  Dept: [phone]  and follow the prompts.  Office hours are 8:00 a.m. to 4:30 p.m. Monday - Friday. Please note that voicemails left after 4:00 p.m. may not be returned until the following business day.  We are closed weekends and major holidays. You have access to a nurse at all times for urgent questions. Please call the main number to the clinic Dept: [phone] and follow the prompts.   For any non-urgent questions, you may also contact your provider using MyChart. We now offer e-Visits for anyone 43 and older to request care online for non-urgent symptoms. For details visit [email or web address].   Also download the MyChart app! Go to the app store, search "MyChart", open the app, select [place], and log in with your MyChart username and password.

## 2023-11-28 NOTE — Research (Signed)
 [name], ICE COMPRESS: RANDOMIZED TRIAL OF LIMB CRYOCOMPRESSION VERSUS CONTINUOUS COMPRESSION VERSUS LOW CYCLIC COMPRESSION FOR THE PREVENTION OF TAXANE-INDUCED PERIPHERAL NEUROPATHY   Mrs. Corrigan communicated with this CRC via email on 11/25/24, indicating that she no longer wishes to remain immobile during her chemo. Pt. was off the Paxman device today and will not use the device for the remaining infusions.   This CRC met with Mrs. Megna during her infusion time. Pt signed the Withdrawal of Treatment Consent form for the above study at that time. She stated that the research team may continue to follow up with her for neurocognitive assessments, PROs, and blood draws.   Research team will follow up and coordinate future appointments accordingly. Pt was thanked for her time and her contribution she has already made to the study. Pt knows to the contact research team any time with questions or concerns.   Halston Fairclough, Ph.D. Clinical Research Coordinator [phone] 11/28/2023 3:37 PM

## 2023-12-03 NOTE — Assessment & Plan Note (Signed)
 10/07/2023:Screening mammogram detected right breast mass at 12:00 measuring 5.1 cm, 1 axillary lymph node biopsy was benign biopsy of the mass grade 3 IDC with necrosis ER 30% weak, PR 0%, Ki67 60%, HER2 negative   Treatment plan: Neoadjuvant chemotherapy with Taxol  carbo Keytruda  followed by Adriamycin Cytoxan Keytruda  Breast conserving surgery with sentinel lymph node biopsy Adjuvant radiation therapy Antiestrogen therapy with the final path is estrogen receptor positive CT CAP and bone scan 11/04/2023: 4.7 cm right breast mass, right axillary lymph node 1.8 cm, 0.2 cm lung nodule benign Breast MRI 10/30/2023: Right breast malignancy 4.7 cm, enlarged right axillary lymph node ------------------------------------------------------------------------------------------------------------------------------------------------ Current treatment: Cycle 5 Taxol  carbo Keytruda  Chemo toxicities: Denied any nausea vomiting or GI upset Mild constipation a few days after chemo Return to clinic weekly for chemotherapy

## 2023-12-04 ENCOUNTER — Inpatient Hospital Stay (HOSPITAL_BASED_OUTPATIENT_CLINIC_OR_DEPARTMENT_OTHER): Admitting: Hematology and Oncology

## 2023-12-04 ENCOUNTER — Inpatient Hospital Stay

## 2023-12-04 ENCOUNTER — Inpatient Hospital Stay: Attending: Hematology and Oncology

## 2023-12-04 ENCOUNTER — Other Ambulatory Visit: Payer: Self-pay

## 2023-12-04 ENCOUNTER — Other Ambulatory Visit (HOSPITAL_COMMUNITY): Payer: Self-pay

## 2023-12-04 VITALS — BP 121/63 | HR 98 | Temp 98.2°F | Resp 16 | Ht 60.0 in | Wt 179.5 lb

## 2023-12-04 DIAGNOSIS — Z170 Estrogen receptor positive status [ER+]: Secondary | ICD-10-CM | POA: Diagnosis not present

## 2023-12-04 DIAGNOSIS — Z5111 Encounter for antineoplastic chemotherapy: Secondary | ICD-10-CM | POA: Diagnosis not present

## 2023-12-04 DIAGNOSIS — Z1732 Human epidermal growth factor receptor 2 negative status: Secondary | ICD-10-CM | POA: Insufficient documentation

## 2023-12-04 DIAGNOSIS — C50411 Malignant neoplasm of upper-outer quadrant of right female breast: Secondary | ICD-10-CM

## 2023-12-04 DIAGNOSIS — Z79899 Other long term (current) drug therapy: Secondary | ICD-10-CM | POA: Diagnosis not present

## 2023-12-04 DIAGNOSIS — Z79620 Long term (current) use of immunosuppressive biologic: Secondary | ICD-10-CM | POA: Insufficient documentation

## 2023-12-04 DIAGNOSIS — Z1721 Progesterone receptor positive status: Secondary | ICD-10-CM | POA: Insufficient documentation

## 2023-12-04 DIAGNOSIS — Z171 Estrogen receptor negative status [ER-]: Secondary | ICD-10-CM | POA: Insufficient documentation

## 2023-12-04 DIAGNOSIS — Z5112 Encounter for antineoplastic immunotherapy: Secondary | ICD-10-CM | POA: Diagnosis not present

## 2023-12-04 LAB — CBC WITH DIFFERENTIAL (CANCER CENTER ONLY)
Abs Immature Granulocytes: 0.04 10*3/uL (ref 0.00–0.07)
Basophils Absolute: 0 10*3/uL (ref 0.0–0.1)
Basophils Relative: 1 %
Eosinophils Absolute: 0 10*3/uL (ref 0.0–0.5)
Eosinophils Relative: 0 %
HCT: 38.9 % (ref 36.0–46.0)
Hemoglobin: 13 g/dL (ref 12.0–15.0)
Immature Granulocytes: 1 %
Lymphocytes Relative: 26 %
Lymphs Abs: 1.4 10*3/uL (ref 0.7–4.0)
MCH: 27.7 pg (ref 26.0–34.0)
MCHC: 33.4 g/dL (ref 30.0–36.0)
MCV: 82.8 fL (ref 80.0–100.0)
Monocytes Absolute: 0.3 10*3/uL (ref 0.1–1.0)
Monocytes Relative: 6 %
Neutro Abs: 3.5 10*3/uL (ref 1.7–7.7)
Neutrophils Relative %: 66 %
Platelet Count: 325 10*3/uL (ref 150–400)
RBC: 4.7 MIL/uL (ref 3.87–5.11)
RDW: 14 % (ref 11.5–15.5)
WBC Count: 5.2 10*3/uL (ref 4.0–10.5)
nRBC: 0 % (ref 0.0–0.2)

## 2023-12-04 LAB — CMP (CANCER CENTER ONLY)
ALT: 24 U/L (ref 0–44)
AST: 17 U/L (ref 15–41)
Albumin: 4 g/dL (ref 3.5–5.0)
Alkaline Phosphatase: 72 U/L (ref 38–126)
Anion gap: 6 (ref 5–15)
BUN: 15 mg/dL (ref 6–20)
CO2: 29 mmol/L (ref 22–32)
Calcium: 9.3 mg/dL (ref 8.9–10.3)
Chloride: 102 mmol/L (ref 98–111)
Creatinine: 0.6 mg/dL (ref 0.44–1.00)
GFR, Estimated: >60 mL/min (ref >60)
Glucose, Bld: 132 mg/dL — ABNORMAL HIGH (ref 70–99)
Potassium: 3.4 mmol/L — ABNORMAL LOW (ref 3.5–5.1)
Sodium: 137 mmol/L (ref 135–145)
Total Bilirubin: 0.4 mg/dL (ref 0.0–1.2)
Total Protein: 6.8 g/dL (ref 6.5–8.1)

## 2023-12-04 MED ORDER — SODIUM CHLORIDE 0.9 % IV SOLN: INTRAVENOUS | Status: DC

## 2023-12-04 MED ORDER — SODIUM CHLORIDE 0.9 % IV SOLN
80.0000 mg/m2 | Freq: Once | INTRAVENOUS | Status: AC
  Administered 2023-12-04: 150 mg via INTRAVENOUS
  Filled 2023-12-04: qty 25

## 2023-12-04 MED ORDER — DEXAMETHASONE SODIUM PHOSPHATE 10 MG/ML IJ SOLN
10.0000 mg | Freq: Once | INTRAMUSCULAR | Status: AC
  Administered 2023-12-04: 10 mg via INTRAVENOUS
  Filled 2023-12-04: qty 1

## 2023-12-04 MED ORDER — DIPHENHYDRAMINE HCL 50 MG/ML IJ SOLN
50.0000 mg | Freq: Once | INTRAMUSCULAR | Status: AC
  Administered 2023-12-04: 50 mg via INTRAVENOUS
  Filled 2023-12-04: qty 1

## 2023-12-04 MED ORDER — FAMOTIDINE IN NACL 20-0.9 MG/50ML-% IV SOLN
20.0000 mg | Freq: Once | INTRAVENOUS | Status: AC
  Administered 2023-12-04: 20 mg via INTRAVENOUS
  Filled 2023-12-04: qty 50

## 2023-12-04 MED ORDER — CLINDAMYCIN PHOS-BENZOYL PEROX 1-5 % EX GEL
Freq: Two times a day (BID) | CUTANEOUS | 1 refills | Status: DC
  Filled 2023-12-04 (×2): qty 25, 5d supply, fill #0
  Filled 2023-12-04: qty 25, 10d supply, fill #0
  Filled 2023-12-12 – 2023-12-16 (×2): qty 50, 15d supply, fill #1

## 2023-12-04 MED ORDER — CLINDAMYCIN PHOS-BENZOYL PEROX 1-5 % EX GEL
Freq: Two times a day (BID) | CUTANEOUS | 0 refills | Status: DC
  Filled 2023-12-04: qty 25, fill #0

## 2023-12-04 MED ORDER — SODIUM CHLORIDE 0.9% FLUSH: 10.0000 mL | INTRAVENOUS | Status: DC | PRN

## 2023-12-04 MED ORDER — HEPARIN SOD (PORK) LOCK FLUSH 100 UNIT/ML IV SOLN
500.0000 [IU] | Freq: Once | INTRAVENOUS | Status: AC | PRN
  Administered 2023-12-04: 500 [IU]

## 2023-12-04 MED ORDER — PALONOSETRON HCL INJECTION 0.25 MG/5ML
0.2500 mg | Freq: Once | INTRAVENOUS | Status: AC
  Administered 2023-12-04: 0.25 mg via INTRAVENOUS
  Filled 2023-12-04: qty 5

## 2023-12-04 MED ORDER — SODIUM CHLORIDE 0.9 % IV SOLN
194.8500 mg | Freq: Once | INTRAVENOUS | Status: AC
  Administered 2023-12-04: 190 mg via INTRAVENOUS
  Filled 2023-12-04: qty 19

## 2023-12-04 NOTE — Progress Notes (Signed)
 Patient Care Team: [name], NP as PCP - General (Obstetrics and Gynecology) Lavoie, Marie-Lyne, MD as Consulting Physician (Obstetrics and Gynecology) Odean Potts, MD as Consulting Physician (Hematology and Oncology) Glean Stephane BROCKS, RN (Inactive) as Oncology Nurse Navigator Tyree Nanetta SAILOR, RN as Oncology Nurse Navigator Curvin Deward MOULD, MD as Consulting Physician (General Surgery) Izell Domino, MD as Attending Physician (Radiation Oncology)  DIAGNOSIS:  Encounter Diagnosis  Name Primary?   Malignant neoplasm of upper-outer quadrant of right breast in female, estrogen receptor positive (HCC) Yes    SUMMARY OF ONCOLOGIC HISTORY: Oncology History  Malignant neoplasm of upper-outer quadrant of right breast in female, estrogen receptor positive (HCC)  10/07/2023 Initial Diagnosis   Screening mammogram detected right breast mass at 12:00 measuring 5.1 cm, 1 axillary lymph node biopsy was benign biopsy of the mass grade 3 IDC with necrosis ER 30% weak, PR 0%, Ki67 60%, HER2 negative   10/22/2023 Cancer Staging   Staging form: Breast, AJCC 8th Edition - Clinical stage from 10/22/2023: Stage IIIA (cT3, cN0, cM0, G3, ER+, PR-, HER2-) - Signed by Odean Potts, MD on 10/22/2023 Stage prefix: Initial diagnosis Histologic grading system: 3 grade system Laterality: Right Staged by: Pathologist and managing physician Stage used in treatment planning: Yes National guidelines used in treatment planning: Yes Type of national guideline used in treatment planning: NCCN   11/02/2023 Genetic Testing   Negative [facility] CancerNext +RNAinsight Panel.  VUS in BARD1 at p.K438E (c.1312A>G).  Report date is 11/02/2023.   The [facility] CancerNext+RNAinsight Panel includes sequencing, rearrangement analysis, and RNA analysis for the following 40 genes: APC, ATM, BAP1, BARD1, BMPR1A, BRCA1, BRCA2, BRIP1, CDH1, CDKN2A, CHEK2, FH, FLCN, MET, MLH1, MSH2, MSH6, MUTYH, NF1, NTHL1, PALB2, PMS2, PTEN, RAD51C,  RAD51D, RSP20, SMAD4, STK11, TP53, TSC1, TSC2, and VHL (sequencing and deletion/duplication); AXIN2, HOXB13, MBD4, MSH3, POLD1 and POLE (sequencing only); EPCAM and GREM1 (deletion/duplication only).   11/06/2023 -  Chemotherapy   Patient is on Treatment Plan : BREAST Pembrolizumab  (200) D1 + Carboplatin  (1.5) D1,8,15 + Paclitaxel  (80) D1,8,15 q21d X 4 cycles / Pembrolizumab  (200) D1 + AC D1 q21d x 4 cycles       CHIEF COMPLIANT: Cycle 5 Taxol  carboplatin  pembrolizumab   HISTORY OF PRESENT ILLNESS:  History of Present Illness Gabrielle Cross is a 58 year old female undergoing treatment for breast cancer who presents with scalp pustules and constipation.  Scalp pustules are present, with some pustules having white heads, and the condition worsens with sweating. Constipation is noted but considered minor. No fevers are present.     ALLERGIES:  is allergic to shellfish allergy.  MEDICATIONS:  Current Outpatient Medications  Medication Sig Dispense Refill   dexamethasone  (DECADRON ) 4 MG tablet With Adriamycin Cytoxan take 1 tablet day after chemo and 1 tablet 2 days after chemo with food 8 tablet 0   hydrochlorothiazide  (HYDRODIURIL ) 12.5 MG tablet Take 1 tablet (12.5 mg total) by mouth daily. 90 tablet 4   imipramine  (TOFRANIL ) 25 MG tablet Take 3 tablets (75 mg total) by mouth daily. 90 tablet 5   lidocaine -prilocaine  (EMLA ) cream Apply to affected area once 30 g 3   ondansetron  (ZOFRAN ) 8 MG tablet Take 1 tablet (8 mg total) by mouth every 8 (eight) hours as needed for nausea or vomiting. Start on the third day after chemotherapy. 30 tablet 1   prochlorperazine  (COMPAZINE ) 10 MG tablet Take 1 tablet (10 mg total) by mouth every 6 (six) hours as needed for nausea or vomiting. 30 tablet  1   rizatriptan  (MAXALT ) 10 MG tablet Take 1 tablet (10 mg total) by mouth as needed. may repeat once after 2 hours 4 tablet 5   No current facility-administered medications for this visit.    PHYSICAL  EXAMINATION: ECOG PERFORMANCE STATUS: 1 - Symptomatic but completely ambulatory  Vitals:   12/04/23 0954  BP: 121/63  Pulse: 98  Resp: 16  Temp: 98.2 F (36.8 C)  SpO2: 97%   Filed Weights   12/04/23 0954  Weight: 179 lb 8 oz (81.4 kg)    Physical Exam Scalp: Multiple maculopapular lesions a few of them have pustules.  (exam performed in the presence of a chaperone)  LABORATORY DATA:  I have reviewed the data as listed    Latest Ref Rng & Units 11/28/2023   12:31 PM 11/21/2023    8:56 AM 11/13/2023    7:58 AM  CMP  Glucose 70 - 99 mg/dL 867  889  878   BUN 6 - 20 mg/dL 15  17  14    Creatinine 0.44 - 1.00 mg/dL 9.35  9.33  9.31   Sodium 135 - 145 mmol/L 138  138  138   Potassium 3.5 - 5.1 mmol/L 3.6  3.4  3.5   Chloride 98 - 111 mmol/L 102  103  104   CO2 22 - 32 mmol/L 28  28  27    Calcium 8.9 - 10.3 mg/dL 9.1  9.1  9.1   Total Protein 6.5 - 8.1 g/dL 6.8  7.0  6.9   Total Bilirubin 0.0 - 1.2 mg/dL 0.3  0.4  0.4   Alkaline Phos 38 - 126 U/L 77  85  76   AST 15 - 41 U/L 17  15  17    ALT 0 - 44 U/L 24  23  26      Lab Results  Component Value Date   WBC 5.2 12/04/2023   HGB 13.0 12/04/2023   HCT 38.9 12/04/2023   MCV 82.8 12/04/2023   PLT 325 12/04/2023   NEUTROABS 3.5 12/04/2023    ASSESSMENT & PLAN:  Malignant neoplasm of upper-outer quadrant of right breast in female, estrogen receptor positive (HCC) 10/07/2023:Screening mammogram detected right breast mass at 12:00 measuring 5.1 cm, 1 axillary lymph node biopsy was benign biopsy of the mass grade 3 IDC with necrosis ER 30% weak, PR 0%, Ki67 60%, HER2 negative   Treatment plan: Neoadjuvant chemotherapy with Taxol  carbo Keytruda  followed by Adriamycin Cytoxan Keytruda  Breast conserving surgery with sentinel lymph node biopsy Adjuvant radiation therapy Antiestrogen therapy with the final path is estrogen receptor positive CT CAP and bone scan 11/04/2023: 4.7 cm right breast mass, right axillary lymph node 1.8 cm,  0.2 cm lung nodule benign Breast MRI 10/30/2023: Right breast malignancy 4.7 cm, enlarged right axillary lymph node ------------------------------------------------------------------------------------------------------------------------------------------------ Current treatment: Cycle 5 Taxol  carbo Keytruda  Chemo toxicities: Denied any nausea vomiting or GI upset Mild constipation a few days after chemo Scalp pustules and folliculitis: Sent a prescription for clindamycin  ointment.  If it does not improve then she will need antibiotic therapy. Return to clinic weekly for chemotherapy ------------------------------------- Assessment and Plan Assessment & Plan Scalp folliculitis Scalp folliculitis likely due to irritation from hair loss treatment with possible secondary infection. No systemic infection indicated by absence of fever. - Prescribed clindamycin  lotion for pustules, especially white pustules on scalp. - Advised application only to affected areas and handwashing post-application. - Increased prescription quantity to 50 grams.      No orders of the  defined types were placed in this encounter.  The patient has a good understanding of the overall plan. she agrees with it. she will call with any problems that may develop before the next visit here. Total time spent: 30 mins including face to face time and time spent for planning, charting and co-ordination of care   Viinay K Hatsuko Bizzarro, MD 12/04/23

## 2023-12-04 NOTE — Patient Instructions (Signed)
 [facility] - A DEPT OF [facility]  Discharge Instructions: Thank you for choosing [facility] to provide your oncology and hematology care.   If you have a lab appointment with the Cancer Center, please go directly to the Cancer Center and check in at the registration area.   Wear comfortable clothing and clothing appropriate for easy access to any Portacath or PICC line.   We strive to give you quality time with your provider. You may need to reschedule your appointment if you arrive late (15 or more minutes).  Arriving late affects you and other patients whose appointments are after yours.  Also, if you miss three or more appointments without notifying the office, you may be dismissed from the clinic at the provider's discretion.      For prescription refill requests, have your pharmacy contact our office and allow 72 hours for refills to be completed.    Today you received the following chemotherapy and/or immunotherapy agents taxol and carboplatin      To help prevent nausea and vomiting after your treatment, we encourage you to take your nausea medication as directed.  BELOW ARE SYMPTOMS THAT SHOULD BE REPORTED IMMEDIATELY: *FEVER GREATER THAN 100.4 F (38 C) OR HIGHER *CHILLS OR SWEATING *NAUSEA AND VOMITING THAT IS NOT CONTROLLED WITH YOUR NAUSEA MEDICATION *UNUSUAL SHORTNESS OF BREATH *UNUSUAL BRUISING OR BLEEDING *URINARY PROBLEMS (pain or burning when urinating, or frequent urination) *BOWEL PROBLEMS (unusual diarrhea, constipation, pain near the anus) TENDERNESS IN MOUTH AND THROAT WITH OR WITHOUT PRESENCE OF ULCERS (sore throat, sores in mouth, or a toothache) UNUSUAL RASH, SWELLING OR PAIN  UNUSUAL VAGINAL DISCHARGE OR ITCHING   Items with * indicate a potential emergency and should be followed up as soon as possible or go to the Emergency Department if any problems should occur.  Please show the CHEMOTHERAPY ALERT CARD or  IMMUNOTHERAPY ALERT CARD at check-in to the Emergency Department and triage nurse.  Should you have questions after your visit or need to cancel or reschedule your appointment, please contact [facility] - A DEPT OF [facility]  Dept: [phone]  and follow the prompts.  Office hours are 8:00 a.m. to 4:30 p.m. Monday - Friday. Please note that voicemails left after 4:00 p.m. may not be returned until the following business day.  We are closed weekends and major holidays. You have access to a nurse at all times for urgent questions. Please call the main number to the clinic Dept: [phone] and follow the prompts.   For any non-urgent questions, you may also contact your provider using MyChart. We now offer e-Visits for anyone 62 and older to request care online for non-urgent symptoms. For details visit [email or web address].   Also download the MyChart app! Go to the app store, search "MyChart", open the app, select [place], and log in with your MyChart username and password.

## 2023-12-09 ENCOUNTER — Ambulatory Visit (INDEPENDENT_AMBULATORY_CARE_PROVIDER_SITE_OTHER): Admitting: Internal Medicine

## 2023-12-09 ENCOUNTER — Other Ambulatory Visit: Payer: Self-pay

## 2023-12-09 ENCOUNTER — Other Ambulatory Visit (HOSPITAL_COMMUNITY): Payer: Self-pay

## 2023-12-09 ENCOUNTER — Encounter: Payer: Self-pay | Admitting: Internal Medicine

## 2023-12-09 ENCOUNTER — Ambulatory Visit: Payer: Self-pay | Admitting: Internal Medicine

## 2023-12-09 VITALS — BP 128/84 | HR 80 | Temp 98.4°F | Resp 16 | Ht 60.0 in | Wt 178.4 lb

## 2023-12-09 DIAGNOSIS — T502X5A Adverse effect of carbonic-anhydrase inhibitors, benzothiadiazides and other diuretics, initial encounter: Secondary | ICD-10-CM

## 2023-12-09 DIAGNOSIS — E11628 Type 2 diabetes mellitus with other skin complications: Secondary | ICD-10-CM | POA: Diagnosis not present

## 2023-12-09 DIAGNOSIS — E876 Hypokalemia: Secondary | ICD-10-CM | POA: Diagnosis not present

## 2023-12-09 DIAGNOSIS — R739 Hyperglycemia, unspecified: Secondary | ICD-10-CM | POA: Insufficient documentation

## 2023-12-09 DIAGNOSIS — I10 Essential (primary) hypertension: Secondary | ICD-10-CM | POA: Diagnosis not present

## 2023-12-09 DIAGNOSIS — E119 Type 2 diabetes mellitus without complications: Secondary | ICD-10-CM | POA: Insufficient documentation

## 2023-12-09 DIAGNOSIS — L739 Follicular disorder, unspecified: Secondary | ICD-10-CM

## 2023-12-09 DIAGNOSIS — E785 Hyperlipidemia, unspecified: Secondary | ICD-10-CM | POA: Diagnosis not present

## 2023-12-09 LAB — LIPID PANEL
Cholesterol: 188 mg/dL (ref 0–200)
HDL: 33.2 mg/dL — ABNORMAL LOW (ref >39.00)
LDL Cholesterol: 119 mg/dL — ABNORMAL HIGH (ref 0–99)
NonHDL: 154.5
Total CHOL/HDL Ratio: 6
Triglycerides: 179 mg/dL — ABNORMAL HIGH (ref 0.0–149.0)
VLDL: 35.8 mg/dL (ref 0.0–40.0)

## 2023-12-09 LAB — BASIC METABOLIC PANEL WITH GFR
BUN: 15 mg/dL (ref 6–23)
CO2: 30 meq/L (ref 19–32)
Calcium: 9.4 mg/dL (ref 8.4–10.5)
Chloride: 101 meq/L (ref 96–112)
Creatinine, Ser: 0.69 mg/dL (ref 0.40–1.20)
GFR: 97.3 mL/min (ref >60.00)
Glucose, Bld: 106 mg/dL — ABNORMAL HIGH (ref 70–99)
Potassium: 3.7 meq/L (ref 3.5–5.1)
Sodium: 136 meq/L (ref 135–145)

## 2023-12-09 LAB — MAGNESIUM: Magnesium: 2.2 mg/dL (ref 1.5–2.5)

## 2023-12-09 LAB — HEMOGLOBIN A1C: Hgb A1c MFr Bld: 6.7 % — ABNORMAL HIGH (ref 4.6–6.5)

## 2023-12-09 MED ORDER — SULFAMETHOXAZOLE-TRIMETHOPRIM 800-160 MG PO TABS
1.0000 | ORAL_TABLET | Freq: Two times a day (BID) | ORAL | 0 refills | Status: AC
  Filled 2023-12-09: qty 14, 7d supply, fill #0

## 2023-12-09 MED ORDER — ROSUVASTATIN CALCIUM 10 MG PO TABS
10.0000 mg | ORAL_TABLET | Freq: Every day | ORAL | 1 refills | Status: DC
  Filled 2023-12-09: qty 90, 90d supply, fill #0

## 2023-12-09 NOTE — Progress Notes (Signed)
 Subjective:  Patient ID: Gabrielle Cross, female    DOB: 01-Feb-1966  Age: 58 y.o. MRN: [id number]  CC: Rash and Hypertension   HPI Gabrielle Cross presents for f/up ----  Discussed the use of AI scribe software for clinical note transcription with the patient, who gave verbal consent to proceed.  History of Present Illness   Gabrielle Cross is a 58 year old female with breast cancer who presents for chemotherapy follow-up.  She has completed five rounds of chemotherapy and is experiencing side effects including constipation, tingling in her fingers, and hair loss. Constipation is managed with stool softeners and increased fiber intake, with bowel movements typically delayed for two days following her Thursday treatments. She has painful pustules on her scalp that interfere with her sleep.  She experiences occasional dizziness or lightheadedness, which she attributes to her history of migraines. She is not currently taking Maxalt  for her migraines but continues with her preventative medication, Impriman.  She has a history of low potassium levels and is currently taking hydrochlorothiazide . She does not take any additional medication for nausea or vomiting outside of what is administered during chemotherapy sessions.  Her last Pap smear was nearly three years ago, and she is on a five-year plan due to consistently normal results. No vaginal discharge or bleeding. She mentions having a port for her IV chemotherapy treatments.       Outpatient Medications Prior to Visit  Medication Sig Dispense Refill   clindamycin -benzoyl peroxide  (BENZACLIN) gel Apply topically 2 (two) times daily. 50 g 1   dexamethasone  (DECADRON ) 4 MG tablet With Adriamycin Cytoxan take 1 tablet day after chemo and 1 tablet 2 days after chemo with food 8 tablet 0   hydrochlorothiazide  (HYDRODIURIL ) 12.5 MG tablet Take 1 tablet (12.5 mg total) by mouth daily. 90 tablet 4   imipramine  (TOFRANIL ) 25 MG tablet Take 3  tablets (75 mg total) by mouth daily. 90 tablet 5   lidocaine -prilocaine  (EMLA ) cream Apply to affected area once 30 g 3   ondansetron  (ZOFRAN ) 8 MG tablet Take 1 tablet (8 mg total) by mouth every 8 (eight) hours as needed for nausea or vomiting. Start on the third day after chemotherapy. 30 tablet 1   prochlorperazine  (COMPAZINE ) 10 MG tablet Take 1 tablet (10 mg total) by mouth every 6 (six) hours as needed for nausea or vomiting. 30 tablet 1   rizatriptan  (MAXALT ) 10 MG tablet Take 1 tablet (10 mg total) by mouth as needed. may repeat once after 2 hours 4 tablet 5   No facility-administered medications prior to visit.    ROS Review of Systems  Constitutional:  Negative for appetite change, chills, diaphoresis, fatigue and fever.  HENT: Negative.    Eyes: Negative.   Respiratory:  Negative for cough, chest tightness, shortness of breath and wheezing.   Cardiovascular:  Negative for chest pain, palpitations and leg swelling.  Gastrointestinal:  Positive for constipation. Negative for abdominal pain, blood in stool, nausea and vomiting.  Endocrine: Negative.   Genitourinary: Negative.  Negative for difficulty urinating.  Musculoskeletal: Negative.  Negative for myalgias.  Skin:  Positive for rash.  Neurological: Negative.  Negative for dizziness and weakness.  Hematological:  Negative for adenopathy. Does not bruise/bleed easily.  Psychiatric/Behavioral: Negative.      Objective:  BP 128/84 (BP Location: Left Arm, Patient Position: Sitting, Cuff Size: Normal)   Pulse 80   Temp 98.4 F (36.9 C) (Oral)   Resp 16   Ht 5' (1.524 m)  Wt 178 lb 6.4 oz (80.9 kg)   LMP 02/01/2022 Comment: last period in 02/2022, spotting for the last week, pt reports she is menopausal per MD  SpO2 97%   BMI 34.84 kg/m   BP Readings from Last 3 Encounters:  12/09/23 128/84  12/04/23 121/63  11/28/23 124/73    Wt Readings from Last 3 Encounters:  12/09/23 178 lb 6.4 oz (80.9 kg)  12/04/23 179  lb 8 oz (81.4 kg)  11/28/23 180 lb 8 oz (81.9 kg)    Physical Exam Vitals reviewed.  Constitutional:      Appearance: Normal appearance. She is not ill-appearing.  HENT:     Mouth/Throat:     Mouth: Mucous membranes are moist.  Eyes:     General: No scleral icterus.    Conjunctiva/sclera: Conjunctivae normal.  Cardiovascular:     Rate and Rhythm: Normal rate and regular rhythm.     Heart sounds: No murmur heard.    No friction rub. No gallop.  Pulmonary:     Effort: Pulmonary effort is normal.     Breath sounds: No stridor. No wheezing, rhonchi or rales.  Abdominal:     General: Abdomen is flat.     Palpations: There is no mass.     Tenderness: There is no abdominal tenderness. There is no guarding.     Hernia: No hernia is present.  Musculoskeletal:        General: Normal range of motion.     Cervical back: Neck supple.     Right lower leg: No edema.     Left lower leg: No edema.  Lymphadenopathy:     Cervical: No cervical adenopathy.  Skin:    Coloration: Skin is not jaundiced.     Findings: Erythema and rash present. No lesion. Rash is pustular. Rash is not macular, papular, urticarial or vesicular.  Neurological:     General: No focal deficit present.     Mental Status: She is alert and oriented to person, place, and time.  Psychiatric:        Mood and Affect: Mood normal.     Lab Results  Component Value Date   WBC 5.2 12/04/2023   HGB 13.0 12/04/2023   HCT 38.9 12/04/2023   PLT 325 12/04/2023   GLUCOSE 106 (H) 12/09/2023   CHOL 188 12/09/2023   TRIG 179.0 (H) 12/09/2023   HDL 33.20 (L) 12/09/2023   LDLCALC 119 (H) 12/09/2023   ALT 24 12/04/2023   AST 17 12/04/2023   NA 136 12/09/2023   K 3.7 12/09/2023   CL 101 12/09/2023   CREATININE 0.69 12/09/2023   BUN 15 12/09/2023   CO2 30 12/09/2023   TSH 0.531 11/06/2023   HGBA1C 6.7 (H) 12/09/2023    IR IMAGING GUIDED PORT INSERTION Result Date: 11/10/2023 INDICATION: Breast cancer EXAM: IMPLANTED PORT  A CATH PLACEMENT WITH ULTRASOUND AND FLUOROSCOPIC GUIDANCE MEDICATIONS: See MAR ANESTHESIA/SEDATION: Versed  2 mg IV; Fentanyl  100 mcg IV; Moderate Sedation Time:  31 The patient was continuously monitored during the procedure by the interventional radiology nurse under my direct supervision. FLUOROSCOPY: 0 minutes, 48 seconds (8 mGy) COMPLICATIONS: None immediate. PROCEDURE: The left neck and chest was prepped with chlorhexidine , and draped in the usual sterile fashion using maximum barrier technique (cap and mask, sterile gown, sterile gloves, large sterile sheet, hand hygiene and cutaneous antiseptic). Local anesthesia was attained by infiltration with 1% lidocaine  with epinephrine . Ultrasound demonstrated patency of the left internal jugular vein, and this was documented  with an image. Under real-time ultrasound guidance, this vein was accessed with a 21 gauge micropuncture needle and image documentation was performed. A small dermatotomy was made at the access site with an 11 scalpel. A 0.018 wire was advanced into the SVC and the access needle exchanged for a 61F micropuncture vascular sheath. The 0.018 wire was then removed and a 0.035 wire advanced into the IVC. An appropriate location for the subcutaneous reservoir was selected below the clavicle and an incision was made through the skin and underlying soft tissues. The subcutaneous tissues were then dissected using a combination of blunt and sharp surgical technique and a pocket was formed. A single lumen power injectable portacatheter was then tunneled through the subcutaneous tissues from the pocket to the dermatotomy and the port reservoir placed within the subcutaneous pocket. The venous access site was then serially dilated and a peel away vascular sheath placed over the wire. The wire was removed and the port catheter advanced into position under fluoroscopic guidance. The catheter tip is positioned in the superior cavoatrial junction. This was  documented with a spot image. The portacatheter was then tested and found to flush and aspirate well. The port was flushed with saline followed by 100 units/mL heparinized saline. The pocket was then closed in two layers using first subdermal inverted interrupted absorbable sutures followed by a running subcuticular suture. The epidermis was then sealed with Dermabond. The dermatotomy at the venous access site was also closed with Dermabond. IMPRESSION: Successful placement of a left IJ approach Power Port with ultrasound and fluoroscopic guidance. The catheter is ready for use. Electronically Signed   By: Maude Naegeli M.D.   On: 11/10/2023 15:55    Assessment & Plan:  Acute folliculitis- Clx is negative. -     Sulfamethoxazole -Trimethoprim ; Take 1 tablet by mouth 2 (two) times daily for 7 days.  Dispense: 14 tablet; Refill: 0 -     WOUND CULTURE; Future  Dyslipidemia, goal LDL below 130- Gabrielle start a statin for CV risk reduction. -     Lipid panel; Future -     Rosuvastatin  Calcium ; Take 1 tablet (10 mg total) by mouth daily.  Dispense: 90 tablet; Refill: 1  Primary hypertension- BP is well controlled. -     Basic metabolic panel with GFR; Future -     Magnesium; Future  Chronic hyperglycemia -     Basic metabolic panel with GFR; Future -     Hemoglobin A1c; Future  Diuretic-induced hypokalemia -     Basic metabolic panel with GFR; Future -     Magnesium; Future  Type 2 diabetes mellitus with other skin complication, without long-term current use of insulin (HCC)- A1C is 6.7%.     Follow-up: Return if symptoms worsen or fail to improve.  Debby Molt, MD

## 2023-12-09 NOTE — Patient Instructions (Signed)
Folliculitis  Folliculitis occurs when hair follicles become inflamed. A hair follicle is a tiny opening in your skin where your hair grows from. This condition often occurs on the scalp, thighs, legs, back, and buttocks but can happen anywhere on the body. What are the causes? A common cause of this condition is an infection from bacteria. The type of folliculitis caused by bacteria can last a long time or go away and come back. The bacteria can live anywhere on your skin. They are often found in the nostrils. Other causes may include: An infection from a fungus. An infection from a virus. Your skin touching some chemicals, such as oils and tars. Shaving or waxing. Greasy ointments or creams put on the skin. What increases the risk? You are more likely to develop this condition if: Your body has a weak disease-fighting system (immune system). You have diabetes. You are obese. What are the signs or symptoms? Symptoms of this condition include: Redness. Soreness. Swelling. Itching. Small white or yellow, itchy spots filled with pus (pustules) that appear over a red area. If the infection goes deep into the follicle, these may turn into a boil (furuncle). A group of boils (carbuncle). These tend to form in hairy, sweaty areas of the body. How is this diagnosed? This condition is diagnosed with a skin exam. Your health care provider may take a sample of one of the pustules or boils to test in a lab. How is this treated? This condition may be treated by: Putting a warm, wet cloth (warm compress) on the affected areas. Taking antibiotics or applying them to the skin. Applying or bathing with a solution that kills germs (antiseptic). Taking an over-the-counter medicine. This can help with itching. Having a procedure to drain pustules or boils. This may be done if a pustule or boil contains a lot of pus or fluid. Having laser hair removal. This may be done when the condition lasts for a  long time. Follow these instructions at home: Managing pain and swelling  If directed, apply heat to the affected area as often as told by your health care provider. Use the heat source that your health care provider recommends, such as a moist heat pack or a heating pad. Place a towel between your skin and the heat source. Leave the heat on for 20-30 minutes. If your skin turns bright red, remove the heat right away to prevent burns. The risk of burns is higher if you cannot feel pain, heat, or cold. General instructions Take over-the-counter and prescription medicines only as told by your health care provider. If you were prescribed antibiotics, take or apply them as told by your health care provider. Do not stop using the antibiotic even if you start to feel better. Check your irritated area every day for signs of infection. Check for: More redness, swelling, or pain. Fluid or blood. Warmth. Pus or a bad smell. Do not shave irritated skin. Keep all follow-up visits. Your health care provider will check if the treatments are helping. Contact a health care provider if: You have a fever. You have any signs of infection. Red streaks are spreading from the affected area. This information is not intended to replace advice given to you by your health care provider. Make sure you discuss any questions you have with your health care provider. Document Revised: 10/23/2021 Document Reviewed: 10/23/2021 Elsevier Patient Education  2024 [employer].

## 2023-12-10 ENCOUNTER — Other Ambulatory Visit: Payer: Self-pay

## 2023-12-10 NOTE — Assessment & Plan Note (Signed)
 10/07/2023:Screening mammogram detected right breast mass at 12:00 measuring 5.1 cm, 1 axillary lymph node biopsy was benign biopsy of the mass grade 3 IDC with necrosis ER 30% weak, PR 0%, Ki67 60%, HER2 negative   Treatment plan: Neoadjuvant chemotherapy with Taxol  carbo Keytruda  followed by Adriamycin Cytoxan Keytruda  Breast conserving surgery with sentinel lymph node biopsy Adjuvant radiation therapy Antiestrogen therapy with the final path is estrogen receptor positive CT CAP and bone scan 11/04/2023: 4.7 cm right breast mass, right axillary lymph node 1.8 cm, 0.2 cm lung nodule benign Breast MRI 10/30/2023: Right breast malignancy 4.7 cm, enlarged right axillary lymph node ------------------------------------------------------------------------------------------------------------------------------------------------ Current treatment: Cycle 6 Taxol  carbo Keytruda  Chemo toxicities: Denied any nausea vomiting or GI upset Mild constipation a few days after chemo Scalp pustules and folliculitis: Sent a prescription for clindamycin  ointment.  If it does not improve then she will need antibiotic therapy. Return to clinic weekly for chemotherapy

## 2023-12-11 ENCOUNTER — Inpatient Hospital Stay

## 2023-12-11 ENCOUNTER — Inpatient Hospital Stay: Admitting: Hematology and Oncology

## 2023-12-11 ENCOUNTER — Encounter: Payer: Self-pay | Admitting: *Deleted

## 2023-12-11 ENCOUNTER — Encounter: Payer: Self-pay | Admitting: Hematology and Oncology

## 2023-12-11 VITALS — BP 124/64 | HR 93 | Temp 97.4°F | Resp 18 | Ht 60.0 in | Wt 180.4 lb

## 2023-12-11 DIAGNOSIS — Z170 Estrogen receptor positive status [ER+]: Secondary | ICD-10-CM

## 2023-12-11 DIAGNOSIS — Z5111 Encounter for antineoplastic chemotherapy: Secondary | ICD-10-CM | POA: Diagnosis not present

## 2023-12-11 DIAGNOSIS — Z1721 Progesterone receptor positive status: Secondary | ICD-10-CM | POA: Diagnosis not present

## 2023-12-11 DIAGNOSIS — C50411 Malignant neoplasm of upper-outer quadrant of right female breast: Secondary | ICD-10-CM | POA: Diagnosis not present

## 2023-12-11 DIAGNOSIS — Z79620 Long term (current) use of immunosuppressive biologic: Secondary | ICD-10-CM | POA: Diagnosis not present

## 2023-12-11 DIAGNOSIS — Z1732 Human epidermal growth factor receptor 2 negative status: Secondary | ICD-10-CM | POA: Diagnosis not present

## 2023-12-11 DIAGNOSIS — Z171 Estrogen receptor negative status [ER-]: Secondary | ICD-10-CM | POA: Diagnosis not present

## 2023-12-11 DIAGNOSIS — Z95828 Presence of other vascular implants and grafts: Secondary | ICD-10-CM

## 2023-12-11 DIAGNOSIS — Z5112 Encounter for antineoplastic immunotherapy: Secondary | ICD-10-CM | POA: Diagnosis not present

## 2023-12-11 DIAGNOSIS — Z79899 Other long term (current) drug therapy: Secondary | ICD-10-CM | POA: Diagnosis not present

## 2023-12-11 LAB — CBC WITH DIFFERENTIAL (CANCER CENTER ONLY)
Abs Immature Granulocytes: 0.2 K/uL — ABNORMAL HIGH (ref 0.00–0.07)
Basophils Absolute: 0 K/uL (ref 0.0–0.1)
Basophils Relative: 0 %
Eosinophils Absolute: 0 K/uL (ref 0.0–0.5)
Eosinophils Relative: 0 %
HCT: 35.8 % — ABNORMAL LOW (ref 36.0–46.0)
Hemoglobin: 12 g/dL (ref 12.0–15.0)
Immature Granulocytes: 4 %
Lymphocytes Relative: 30 %
Lymphs Abs: 1.8 K/uL (ref 0.7–4.0)
MCH: 28 pg (ref 26.0–34.0)
MCHC: 33.5 g/dL (ref 30.0–36.0)
MCV: 83.6 fL (ref 80.0–100.0)
Monocytes Absolute: 0.6 K/uL (ref 0.1–1.0)
Monocytes Relative: 10 %
Neutro Abs: 3.2 K/uL (ref 1.7–7.7)
Neutrophils Relative %: 56 %
Platelet Count: 308 K/uL (ref 150–400)
RBC: 4.28 MIL/uL (ref 3.87–5.11)
RDW: 14.7 % (ref 11.5–15.5)
WBC Count: 5.8 K/uL (ref 4.0–10.5)
nRBC: 0 % (ref 0.0–0.2)

## 2023-12-11 LAB — CMP (CANCER CENTER ONLY)
ALT: 20 U/L (ref 0–44)
AST: 13 U/L — ABNORMAL LOW (ref 15–41)
Albumin: 3.7 g/dL (ref 3.5–5.0)
Alkaline Phosphatase: 71 U/L (ref 38–126)
Anion gap: 8 (ref 5–15)
BUN: 14 mg/dL (ref 6–20)
CO2: 27 mmol/L (ref 22–32)
Calcium: 9.1 mg/dL (ref 8.9–10.3)
Chloride: 105 mmol/L (ref 98–111)
Creatinine: 0.63 mg/dL (ref 0.44–1.00)
GFR, Estimated: >60 mL/min (ref >60)
Glucose, Bld: 131 mg/dL — ABNORMAL HIGH (ref 70–99)
Potassium: 3.3 mmol/L — ABNORMAL LOW (ref 3.5–5.1)
Sodium: 140 mmol/L (ref 135–145)
Total Bilirubin: 0.3 mg/dL (ref 0.0–1.2)
Total Protein: 6.6 g/dL (ref 6.5–8.1)

## 2023-12-11 MED ORDER — DIPHENHYDRAMINE HCL 50 MG/ML IJ SOLN
50.0000 mg | Freq: Once | INTRAMUSCULAR | Status: AC
  Administered 2023-12-11: 50 mg via INTRAVENOUS
  Filled 2023-12-11: qty 1

## 2023-12-11 MED ORDER — FAMOTIDINE IN NACL 20-0.9 MG/50ML-% IV SOLN
20.0000 mg | Freq: Once | INTRAVENOUS | Status: AC
  Administered 2023-12-11: 20 mg via INTRAVENOUS
  Filled 2023-12-11: qty 50

## 2023-12-11 MED ORDER — PALONOSETRON HCL INJECTION 0.25 MG/5ML
0.2500 mg | Freq: Once | INTRAVENOUS | Status: AC
  Administered 2023-12-11: 0.25 mg via INTRAVENOUS
  Filled 2023-12-11: qty 5

## 2023-12-11 MED ORDER — SODIUM CHLORIDE 0.9 % IV SOLN
80.0000 mg/m2 | Freq: Once | INTRAVENOUS | Status: AC
  Administered 2023-12-11: 150 mg via INTRAVENOUS
  Filled 2023-12-11: qty 25

## 2023-12-11 MED ORDER — SODIUM CHLORIDE 0.9 % IV SOLN
192.9000 mg | Freq: Once | INTRAVENOUS | Status: AC
  Administered 2023-12-11: 190 mg via INTRAVENOUS
  Filled 2023-12-11: qty 19

## 2023-12-11 MED ORDER — HEPARIN SOD (PORK) LOCK FLUSH 100 UNIT/ML IV SOLN: 500.0000 [IU] | Freq: Once | INTRAVENOUS | Status: DC | PRN

## 2023-12-11 MED ORDER — DEXAMETHASONE SODIUM PHOSPHATE 10 MG/ML IJ SOLN
4.0000 mg | Freq: Once | INTRAMUSCULAR | Status: AC
  Administered 2023-12-11: 4 mg via INTRAVENOUS
  Filled 2023-12-11: qty 1

## 2023-12-11 MED ORDER — SODIUM CHLORIDE 0.9% FLUSH
10.0000 mL | Freq: Once | INTRAVENOUS | Status: AC
  Administered 2023-12-11: 10 mL

## 2023-12-11 MED ORDER — SODIUM CHLORIDE 0.9% FLUSH: 10.0000 mL | INTRAVENOUS | Status: DC | PRN

## 2023-12-11 MED ORDER — SODIUM CHLORIDE 0.9 % IV SOLN: INTRAVENOUS | Status: DC

## 2023-12-11 NOTE — Patient Instructions (Signed)

## 2023-12-11 NOTE — Progress Notes (Signed)
 Patient Care Team: [name], NP as PCP - General (Obstetrics and Gynecology) Lavoie, Marie-Lyne, MD as Consulting Physician (Obstetrics and Gynecology) Odean Potts, MD as Consulting Physician (Hematology and Oncology) Glean Stephane BROCKS, RN (Inactive) as Oncology Nurse Navigator Tyree Nanetta SAILOR, RN as Oncology Nurse Navigator Curvin Deward MOULD, MD as Consulting Physician (General Surgery) Izell Domino, MD as Attending Physician (Radiation Oncology)  DIAGNOSIS:  Encounter Diagnosis  Name Primary?   Malignant neoplasm of upper-outer quadrant of right breast in female, estrogen receptor positive (HCC) Yes    SUMMARY OF ONCOLOGIC HISTORY: Oncology History  Malignant neoplasm of upper-outer quadrant of right breast in female, estrogen receptor positive (HCC)  10/07/2023 Initial Diagnosis   Screening mammogram detected right breast mass at 12:00 measuring 5.1 cm, 1 axillary lymph node biopsy was benign biopsy of the mass grade 3 IDC with necrosis ER 30% weak, PR 0%, Ki67 60%, HER2 negative   10/22/2023 Cancer Staging   Staging form: Breast, AJCC 8th Edition - Clinical stage from 10/22/2023: Stage IIIA (cT3, cN0, cM0, G3, ER+, PR-, HER2-) - Signed by Odean Potts, MD on 10/22/2023 Stage prefix: Initial diagnosis Histologic grading system: 3 grade system Laterality: Right Staged by: Pathologist and managing physician Stage used in treatment planning: Yes National guidelines used in treatment planning: Yes Type of national guideline used in treatment planning: NCCN   11/02/2023 Genetic Testing   Negative [facility] CancerNext +RNAinsight Panel.  VUS in BARD1 at p.K438E (c.1312A>G).  Report date is 11/02/2023.   The [facility] CancerNext+RNAinsight Panel includes sequencing, rearrangement analysis, and RNA analysis for the following 40 genes: APC, ATM, BAP1, BARD1, BMPR1A, BRCA1, BRCA2, BRIP1, CDH1, CDKN2A, CHEK2, FH, FLCN, MET, MLH1, MSH2, MSH6, MUTYH, NF1, NTHL1, PALB2, PMS2, PTEN, RAD51C,  RAD51D, RSP20, SMAD4, STK11, TP53, TSC1, TSC2, and VHL (sequencing and deletion/duplication); AXIN2, HOXB13, MBD4, MSH3, POLD1 and POLE (sequencing only); EPCAM and GREM1 (deletion/duplication only).   11/06/2023 -  Chemotherapy   Patient is on Treatment Plan : BREAST Pembrolizumab  (200) D1 + Carboplatin  (1.5) D1,8,15 + Paclitaxel  (80) D1,8,15 q21d X 4 cycles / Pembrolizumab  (200) D1 + AC D1 q21d x 4 cycles       CHIEF COMPLIANT: Cycle 6 Taxol  Carbo Keytruda   HISTORY OF PRESENT ILLNESS:  History of Present Illness Gabrielle Cross is a 58 year old female undergoing chemotherapy who presents for her sixth cycle of Taxolcarbo.  She experiences sleepiness during treatment due to Benadryl , followed by insomnia for two to four days post-treatment, although her sleep has recently improved. 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 She attempts to walk for thirty minutes daily but often feels too tired. There is no nausea, but she has altered taste with some loss of taste.  She has developed a rash with pustules and redness, particularly on the back of her head. She recently received oral antibiotics and plans to start them. She continues to use a lotion for the rash, which she feels is improving.     ALLERGIES:  is allergic to shellfish allergy.  MEDICATIONS:  Current Outpatient Medications  Medication Sig Dispense Refill   clindamycin -benzoyl peroxide  (BENZACLIN) gel Apply topically 2 (two) times daily. 50 g 1   dexamethasone  (DECADRON ) 4 MG tablet With Adriamycin Cytoxan take 1 tablet day after chemo and 1 tablet 2 days after chemo with food 8 tablet 0   hydrochlorothiazide  (HYDRODIURIL ) 12.5 MG tablet Take 1 tablet (12.5 mg total) by mouth daily. 90 tablet 4  imipramine  (TOFRANIL ) 25 MG tablet Take 3 tablets (75 mg total) by mouth daily. 90 tablet 5   lidocaine -prilocaine  (EMLA ) cream Apply to affected area once 30 g 3   ondansetron  (ZOFRAN ) 8 MG tablet  Take 1 tablet (8 mg total) by mouth every 8 (eight) hours as needed for nausea or vomiting. Start on the third day after chemotherapy. 30 tablet 1   prochlorperazine  (COMPAZINE ) 10 MG tablet Take 1 tablet (10 mg total) by mouth every 6 (six) hours as needed for nausea or vomiting. 30 tablet 1   rizatriptan  (MAXALT ) 10 MG tablet Take 1 tablet (10 mg total) by mouth as needed. may repeat once after 2 hours 4 tablet 5   rosuvastatin  (CRESTOR ) 10 MG tablet Take 1 tablet (10 mg total) by mouth daily. 90 tablet 1   sulfamethoxazole -trimethoprim  (BACTRIM  DS) 800-160 MG tablet Take 1 tablet by mouth 2 (two) times daily for 7 days. (Patient not taking: Reported on 12/11/2023) 14 tablet 0   No current facility-administered medications for this visit.    PHYSICAL EXAMINATION: ECOG PERFORMANCE STATUS: 1 - Symptomatic but completely ambulatory  Vitals:   12/11/23 0802  BP: 124/64  Pulse: 93  Resp: 18  Temp: (!) 97.4 F (36.3 C)  SpO2: 97%   Filed Weights   12/11/23 0802  Weight: 180 lb 6.4 oz (81.8 kg)      LABORATORY DATA:  I have reviewed the data as listed    Latest Ref Rng & Units 12/09/2023    9:20 AM 12/04/2023    9:37 AM 11/28/2023   12:31 PM  CMP  Glucose 70 - 99 mg/dL 893  867  867   BUN 6 - 23 mg/dL 15  15  15    Creatinine 0.40 - 1.20 mg/dL 9.30  9.39  9.35   Sodium 135 - 145 mEq/L 136  137  138   Potassium 3.5 - 5.1 mEq/L 3.7  3.4  3.6   Chloride 96 - 112 mEq/L 101  102  102   CO2 19 - 32 mEq/L 30  29  28    Calcium  8.4 - 10.5 mg/dL 9.4  9.3  9.1   Total Protein 6.5 - 8.1 g/dL  6.8  6.8   Total Bilirubin 0.0 - 1.2 mg/dL  0.4  0.3   Alkaline Phos 38 - 126 U/L  72  77   AST 15 - 41 U/L  17  17   ALT 0 - 44 U/L  24  24     Lab Results  Component Value Date   WBC 5.2 12/04/2023   HGB 13.0 12/04/2023   HCT 38.9 12/04/2023   MCV 82.8 12/04/2023   PLT 325 12/04/2023   NEUTROABS 3.5 12/04/2023    ASSESSMENT & PLAN:  Malignant neoplasm of upper-outer quadrant of right  breast in female, estrogen receptor positive (HCC) 10/07/2023:Screening mammogram detected right breast mass at 12:00 measuring 5.1 cm, 1 axillary lymph node biopsy was benign biopsy of the mass grade 3 IDC with necrosis ER 30% weak, PR 0%, Ki67 60%, HER2 negative   Treatment plan: Neoadjuvant chemotherapy with Taxol  carbo Keytruda  followed by Adriamycin Cytoxan Keytruda  Breast conserving surgery with sentinel lymph node biopsy Adjuvant radiation therapy Antiestrogen therapy if the final path is estrogen receptor positive CT CAP and bone scan 11/04/2023: 4.7 cm right breast mass, right axillary lymph node 1.8 cm, 0.2 cm lung nodule benign Breast MRI 10/30/2023: Right breast malignancy 4.7 cm, enlarged right axillary lymph node ------------------------------------------------------------------------------------------------------------------------------------------------ Current treatment: Cycle  6 Taxol  carbo Keytruda  Chemo toxicities: Hypokalemia: Mild encouraged her to eat potassium containing foods  scalp pustules and folliculitis: She was prescribed Bactrim  by her PCP. Carpal tunnel symptoms in both hands: Not related to Taxol .  Monitoring closely  Return to clinic weekly for chemotherapy  Assessment & Plan Malignant neoplasm of upper-outer quadrant of right breast Undergoing Taxolcarbo chemotherapy, sixth cycle completed. Insomnia improved with steroid dose reduction. Fatigue likely from treatment and work. Taste alteration due to chemotherapy. - Continue Taxolcarbo chemotherapy regimen. - Maintain steroid dose at 4 mg; consider trial without steroids if insomnia persists. - Encourage physical activity to manage fatigue. - Advise ice chips during Taxol  infusion to protect taste buds.  Scalp folliculitis Scalp folliculitis with pustules and redness, improving with lesion drying. Oral antibiotics prescribed, not yet started. - Initiate oral antibiotics as prescribed by PCP. - Continue  topical lotion application as needed.      No orders of the defined types were placed in this encounter.  The patient has a good understanding of the overall plan. she agrees with it. she will call with any problems that may develop before the next visit here. Total time spent: 30 mins including face to face time and time spent for planning, charting and co-ordination of care   Gabrielle K Gabrielle Franson, MD 12/11/23

## 2023-12-12 ENCOUNTER — Ambulatory Visit

## 2023-12-12 ENCOUNTER — Ambulatory Visit: Admitting: Adult Health

## 2023-12-12 ENCOUNTER — Other Ambulatory Visit (HOSPITAL_COMMUNITY): Payer: Self-pay

## 2023-12-12 ENCOUNTER — Other Ambulatory Visit

## 2023-12-13 LAB — WOUND CULTURE: RESULT:: NO GROWTH

## 2023-12-16 ENCOUNTER — Other Ambulatory Visit (HOSPITAL_COMMUNITY): Payer: Self-pay

## 2023-12-17 ENCOUNTER — Other Ambulatory Visit: Payer: Self-pay

## 2023-12-17 DIAGNOSIS — C50411 Malignant neoplasm of upper-outer quadrant of right female breast: Secondary | ICD-10-CM

## 2023-12-17 NOTE — Assessment & Plan Note (Signed)
 10/07/2023:Screening mammogram detected right breast mass at 12:00 measuring 5.1 cm, 1 axillary lymph node biopsy was benign biopsy of the mass grade 3 IDC with necrosis ER 30% weak, PR 0%, Ki67 60%, HER2 negative   Treatment plan: Neoadjuvant chemotherapy with Taxol  carbo Keytruda  followed by Adriamycin Cytoxan Keytruda  Breast conserving surgery with sentinel lymph node biopsy Adjuvant radiation therapy Antiestrogen therapy if the final path is estrogen receptor positive CT CAP and bone scan 11/04/2023: 4.7 cm right breast mass, right axillary lymph node 1.8 cm, 0.2 cm lung nodule benign Breast MRI 10/30/2023: Right breast malignancy 4.7 cm, enlarged right axillary lymph node ------------------------------------------------------------------------------------------------------------------------------------------------ Current treatment: Cycle 7 Taxol  carbo Keytruda  Chemo toxicities: Hypokalemia: Mild encouraged her to eat potassium containing foods  scalp pustules and folliculitis: She was prescribed Bactrim  by her PCP. Carpal tunnel symptoms in both hands: Not related to Taxol .  Monitoring closely   Return to clinic weekly for chemotherapy

## 2023-12-18 ENCOUNTER — Inpatient Hospital Stay (HOSPITAL_BASED_OUTPATIENT_CLINIC_OR_DEPARTMENT_OTHER): Admitting: Hematology and Oncology

## 2023-12-18 ENCOUNTER — Inpatient Hospital Stay

## 2023-12-18 VITALS — BP 124/74 | HR 100 | Temp 98.3°F | Resp 18 | Ht 60.0 in | Wt 182.5 lb

## 2023-12-18 DIAGNOSIS — Z1732 Human epidermal growth factor receptor 2 negative status: Secondary | ICD-10-CM | POA: Diagnosis not present

## 2023-12-18 DIAGNOSIS — Z5111 Encounter for antineoplastic chemotherapy: Secondary | ICD-10-CM | POA: Diagnosis not present

## 2023-12-18 DIAGNOSIS — Z170 Estrogen receptor positive status [ER+]: Secondary | ICD-10-CM

## 2023-12-18 DIAGNOSIS — Z171 Estrogen receptor negative status [ER-]: Secondary | ICD-10-CM | POA: Diagnosis not present

## 2023-12-18 DIAGNOSIS — C50411 Malignant neoplasm of upper-outer quadrant of right female breast: Secondary | ICD-10-CM | POA: Diagnosis not present

## 2023-12-18 DIAGNOSIS — Z95828 Presence of other vascular implants and grafts: Secondary | ICD-10-CM

## 2023-12-18 DIAGNOSIS — Z1721 Progesterone receptor positive status: Secondary | ICD-10-CM | POA: Diagnosis not present

## 2023-12-18 DIAGNOSIS — Z79620 Long term (current) use of immunosuppressive biologic: Secondary | ICD-10-CM | POA: Diagnosis not present

## 2023-12-18 DIAGNOSIS — Z79899 Other long term (current) drug therapy: Secondary | ICD-10-CM | POA: Diagnosis not present

## 2023-12-18 DIAGNOSIS — Z5112 Encounter for antineoplastic immunotherapy: Secondary | ICD-10-CM | POA: Diagnosis not present

## 2023-12-18 LAB — CMP (CANCER CENTER ONLY)
ALT: 25 U/L (ref 0–44)
AST: 17 U/L (ref 15–41)
Albumin: 3.8 g/dL (ref 3.5–5.0)
Alkaline Phosphatase: 67 U/L (ref 38–126)
Anion gap: 6 (ref 5–15)
BUN: 17 mg/dL (ref 6–20)
CO2: 26 mmol/L (ref 22–32)
Calcium: 8.9 mg/dL (ref 8.9–10.3)
Chloride: 104 mmol/L (ref 98–111)
Creatinine: 0.84 mg/dL (ref 0.44–1.00)
GFR, Estimated: >60 mL/min (ref >60)
Glucose, Bld: 121 mg/dL — ABNORMAL HIGH (ref 70–99)
Potassium: 3.9 mmol/L (ref 3.5–5.1)
Sodium: 136 mmol/L (ref 135–145)
Total Bilirubin: 0.3 mg/dL (ref 0.0–1.2)
Total Protein: 6.5 g/dL (ref 6.5–8.1)

## 2023-12-18 LAB — CBC WITH DIFFERENTIAL (CANCER CENTER ONLY)
Abs Immature Granulocytes: 0.07 K/uL (ref 0.00–0.07)
Basophils Absolute: 0 K/uL (ref 0.0–0.1)
Basophils Relative: 1 %
Eosinophils Absolute: 0 K/uL (ref 0.0–0.5)
Eosinophils Relative: 0 %
HCT: 34 % — ABNORMAL LOW (ref 36.0–46.0)
Hemoglobin: 11.5 g/dL — ABNORMAL LOW (ref 12.0–15.0)
Immature Granulocytes: 2 %
Lymphocytes Relative: 12 %
Lymphs Abs: 0.5 K/uL — ABNORMAL LOW (ref 0.7–4.0)
MCH: 28.2 pg (ref 26.0–34.0)
MCHC: 33.8 g/dL (ref 30.0–36.0)
MCV: 83.3 fL (ref 80.0–100.0)
Monocytes Absolute: 0.4 K/uL (ref 0.1–1.0)
Monocytes Relative: 10 %
Neutro Abs: 3.2 K/uL (ref 1.7–7.7)
Neutrophils Relative %: 75 %
Platelet Count: 242 K/uL (ref 150–400)
RBC: 4.08 MIL/uL (ref 3.87–5.11)
RDW: 15.1 % (ref 11.5–15.5)
WBC Count: 4.3 K/uL (ref 4.0–10.5)
nRBC: 0 % (ref 0.0–0.2)

## 2023-12-18 LAB — TSH: TSH: 0.308 u[IU]/mL — ABNORMAL LOW (ref 0.350–4.500)

## 2023-12-18 LAB — RESEARCH LABS

## 2023-12-18 MED ORDER — FAMOTIDINE IN NACL 20-0.9 MG/50ML-% IV SOLN
20.0000 mg | Freq: Once | INTRAVENOUS | Status: AC
  Administered 2023-12-18: 20 mg via INTRAVENOUS
  Filled 2023-12-18: qty 50

## 2023-12-18 MED ORDER — SODIUM CHLORIDE 0.9 % IV SOLN
80.0000 mg/m2 | Freq: Once | INTRAVENOUS | Status: AC
  Administered 2023-12-18: 150 mg via INTRAVENOUS
  Filled 2023-12-18: qty 25

## 2023-12-18 MED ORDER — HEPARIN SOD (PORK) LOCK FLUSH 100 UNIT/ML IV SOLN
500.0000 [IU] | Freq: Once | INTRAVENOUS | Status: AC | PRN
  Administered 2023-12-18: 500 [IU]

## 2023-12-18 MED ORDER — SODIUM CHLORIDE 0.9% FLUSH
10.0000 mL | Freq: Once | INTRAVENOUS | Status: AC
  Administered 2023-12-18: 10 mL

## 2023-12-18 MED ORDER — SODIUM CHLORIDE 0.9% FLUSH
10.0000 mL | INTRAVENOUS | Status: DC | PRN
  Administered 2023-12-18: 10 mL

## 2023-12-18 MED ORDER — SODIUM CHLORIDE 0.9 % IV SOLN
INTRAVENOUS | Status: DC
Start: 2023-12-18 — End: 2023-12-18

## 2023-12-18 MED ORDER — SODIUM CHLORIDE 0.9 % IV SOLN
200.0000 mg | Freq: Once | INTRAVENOUS | Status: AC
  Administered 2023-12-18: 200 mg via INTRAVENOUS
  Filled 2023-12-18: qty 200

## 2023-12-18 MED ORDER — DEXAMETHASONE SODIUM PHOSPHATE 10 MG/ML IJ SOLN
6.0000 mg | Freq: Once | INTRAMUSCULAR | Status: AC
  Administered 2023-12-18: 6 mg via INTRAVENOUS
  Filled 2023-12-18: qty 1

## 2023-12-18 MED ORDER — SODIUM CHLORIDE 0.9 % IV SOLN
185.5500 mg | Freq: Once | INTRAVENOUS | Status: AC
  Administered 2023-12-18: 190 mg via INTRAVENOUS
  Filled 2023-12-18: qty 19

## 2023-12-18 MED ORDER — PALONOSETRON HCL INJECTION 0.25 MG/5ML
0.2500 mg | Freq: Once | INTRAVENOUS | Status: AC
  Administered 2023-12-18: 0.25 mg via INTRAVENOUS
  Filled 2023-12-18: qty 5

## 2023-12-18 MED ORDER — DIPHENHYDRAMINE HCL 50 MG/ML IJ SOLN
50.0000 mg | Freq: Once | INTRAMUSCULAR | Status: AC
  Administered 2023-12-18: 50 mg via INTRAVENOUS
  Filled 2023-12-18: qty 1

## 2023-12-18 NOTE — Patient Instructions (Addendum)
 [facility] - A DEPT OF [facility]  Discharge Instructions: Thank you for choosing [facility] to provide your oncology and hematology care.   If you have a lab appointment with the Cancer Center, please go directly to the Cancer Center and check in at the registration area.   Wear comfortable clothing and clothing appropriate for easy access to any Portacath or PICC line.   We strive to give you quality time with your provider. You may need to reschedule your appointment if you arrive late (15 or more minutes).  Arriving late affects you and other patients whose appointments are after yours.  Also, if you miss three or more appointments without notifying the office, you may be dismissed from the clinic at the provider's discretion.      For prescription refill requests, have your pharmacy contact our office and allow 72 hours for refills to be completed.    Today you received the following chemotherapy and/or immunotherapy agents: Pembrolizumab  (Keytruda ), Paclitaxel  (Taxol ), & Carboplatin  (Paraplatin )   To help prevent nausea and vomiting after your treatment, we encourage you to take your nausea medication as directed.  BELOW ARE SYMPTOMS THAT SHOULD BE REPORTED IMMEDIATELY: *FEVER GREATER THAN 100.4 F (38 C) OR HIGHER *CHILLS OR SWEATING *NAUSEA AND VOMITING THAT IS NOT CONTROLLED WITH YOUR NAUSEA MEDICATION *UNUSUAL SHORTNESS OF BREATH *UNUSUAL BRUISING OR BLEEDING *URINARY PROBLEMS (pain or burning when urinating, or frequent urination) *BOWEL PROBLEMS (unusual diarrhea, constipation, pain near the anus) TENDERNESS IN MOUTH AND THROAT WITH OR WITHOUT PRESENCE OF ULCERS (sore throat, sores in mouth, or a toothache) UNUSUAL RASH, SWELLING OR PAIN  UNUSUAL VAGINAL DISCHARGE OR ITCHING   Items with * indicate a potential emergency and should be followed up as soon as possible or go to the Emergency Department if any problems should  occur.  Please show the CHEMOTHERAPY ALERT CARD or IMMUNOTHERAPY ALERT CARD at check-in to the Emergency Department and triage nurse.  Should you have questions after your visit or need to cancel or reschedule your appointment, please contact [facility] - A DEPT OF [facility]  Dept: [phone]  and follow the prompts.  Office hours are 8:00 a.m. to 4:30 p.m. Monday - Friday. Please note that voicemails left after 4:00 p.m. may not be returned until the following business day.  We are closed weekends and major holidays. You have access to a nurse at all times for urgent questions. Please call the main number to the clinic Dept: [phone] and follow the prompts.   For any non-urgent questions, you may also contact your provider using MyChart. We now offer e-Visits for anyone 51 and older to request care online for non-urgent symptoms. For details visit [email or web address].   Also download the MyChart app! Go to the app store, search MyChart, open the app, select [place], and log in with your MyChart username and password.

## 2023-12-18 NOTE — Research (Unsigned)
 [name], ICE COMPRESS: RANDOMIZED TRIAL OF LIMB CRYOCOMPRESSION VERSUS CONTINUOUS COMPRESSION VERSUS LOW CYCLIC COMPRESSION FOR THE PREVENTION OF TAXANE-INDUCED PERIPHERAL NEUROPATHY   Week 6 Assessments:  Patient arrives today Accompanied by her husband, Ryanfor the Week 6 Assessments. Confirmed patient does not have wounds, sores, or lesions to extremities. Patient has not had any vaccinations since last visit.   PROs: Per study protocol, all PROs required for this visit were completed prior to other study activities and completeness has been verified.     LABS: Optional labs are collected per consent and study protocol by [facility] a Cath. [name] tolerated well without complaint.  MD/PROVIDER VISIT: Patient saw Dr. Gudena for today's visit.    ADVERSE EVENTS: Solicited AEs reviewed with pt. Attribution per Dr. Gudena. Pt reported carpal tunnel symptoms in both hands, which were noted as unrelated to Taxol  per Dr. Gara note. Pt was noted to have pain and swelling in her right knee due to arthritis.  Adverse Event CTCAE Grade Onset date Resolved date Relationship to Study Intervention Action Taken Comments  Skin atrophy (solicited) 0   na    Skin hyperpigmentation (solicited) 0   na    Skin hypopigmentation (solicited) 0   na    Skin induration (solicited) 0   na    Skin ulceration (solicited) 0   na    Rash maculopapular (solicited) 0   na    Nail changes (solicited) 0   na    Cold intolerance (solicited)  (general disorders and administration site conditions- other) 0   na    Frostbite (solicited)  (skin and subcutaneous tissue disorders- other) 0   na    Peripheral neuropathy 1 12/18/23  ongoing  unrelated            Nausea 1 12/18/23 ongoing unrelated    Hypokalemia 1 12/11/23 ongoing unrelated    Rash acneiform (Scalp pustules and folliculitis) 1 12/04/23 ongoing unrelated Rx Bactrim  by PCP    Carpal tunnel symptoms  12/18/23 ongoing unrelated    Arthritis of knee 1  12/18/23 ongoing unrelated    Fatigue 2 12/11/23 ongoing unrelated      NEURO ASSESSMENT: All neuro assessments (Neuropen, [place] ,and Timed Get Up and Go) were completed by Research Nurse Andrea Flank, RN. This CRC acted as a Neurosurgeon. [name] tolerated all testing without complaint.    No Device Management for this visit.    DISPOSITION: Upon completion off all study requirements, patient remained in the exam room to await her next appt.   The patient was thanked for their time and continued voluntary participation in this study. [name] has been provided direct contact information and is encouraged to contact this Coordinator for any needs or questions.  Sesar Madewell, Ph.D. Clinical Research Coordinator [phone] 12/18/2023 10:14 AM

## 2023-12-18 NOTE — Progress Notes (Signed)
 Patient Care Team: [name], NP as PCP - General (Obstetrics and Gynecology) Lavoie, Marie-Lyne, MD as Consulting Physician (Obstetrics and Gynecology) Odean Potts, MD as Consulting Physician (Hematology and Oncology) Glean Stephane BROCKS, RN (Inactive) as Oncology Nurse Navigator Tyree Nanetta SAILOR, RN as Oncology Nurse Navigator Curvin Deward MOULD, MD as Consulting Physician (General Surgery) Izell Domino, MD as Attending Physician (Radiation Oncology)  DIAGNOSIS:  Encounter Diagnosis  Name Primary?   Malignant neoplasm of upper-outer quadrant of right breast in female, estrogen receptor positive (HCC) Yes    SUMMARY OF ONCOLOGIC HISTORY: Oncology History  Malignant neoplasm of upper-outer quadrant of right breast in female, estrogen receptor positive (HCC)  10/07/2023 Initial Diagnosis   Screening mammogram detected right breast mass at 12:00 measuring 5.1 cm, 1 axillary lymph node biopsy was benign biopsy of the mass grade 3 IDC with necrosis ER 30% weak, PR 0%, Ki67 60%, HER2 negative   10/22/2023 Cancer Staging   Staging form: Breast, AJCC 8th Edition - Clinical stage from 10/22/2023: Stage IIIA (cT3, cN0, cM0, G3, ER+, PR-, HER2-) - Signed by Odean Potts, MD on 10/22/2023 Stage prefix: Initial diagnosis Histologic grading system: 3 grade system Laterality: Right Staged by: Pathologist and managing physician Stage used in treatment planning: Yes National guidelines used in treatment planning: Yes Type of national guideline used in treatment planning: NCCN   11/02/2023 Genetic Testing   Negative [facility] CancerNext +RNAinsight Panel.  VUS in BARD1 at p.K438E (c.1312A>G).  Report date is 11/02/2023.   The [facility] CancerNext+RNAinsight Panel includes sequencing, rearrangement analysis, and RNA analysis for the following 40 genes: APC, ATM, BAP1, BARD1, BMPR1A, BRCA1, BRCA2, BRIP1, CDH1, CDKN2A, CHEK2, FH, FLCN, MET, MLH1, MSH2, MSH6, MUTYH, NF1, NTHL1, PALB2, PMS2, PTEN, RAD51C,  RAD51D, RSP20, SMAD4, STK11, TP53, TSC1, TSC2, and VHL (sequencing and deletion/duplication); AXIN2, HOXB13, MBD4, MSH3, POLD1 and POLE (sequencing only); EPCAM and GREM1 (deletion/duplication only).   11/06/2023 -  Chemotherapy   Patient is on Treatment Plan : BREAST Pembrolizumab  (200) D1 + Carboplatin  (1.5) D1,8,15 + Paclitaxel  (80) D1,8,15 q21d X 4 cycles / Pembrolizumab  (200) D1 + AC D1 q21d x 4 cycles       CHIEF COMPLIANT: Cycle 7 Taxol  carbo Keytruda   HISTORY OF PRESENT ILLNESS:   History of Present Illness Gabrielle Cross is a 58 year old female with estrogen receptor-positive breast cancer who presents with nausea and knee pain.  She experiences mild nausea after eating, which she does not find severe enough to require anti-nausea medication. Her sleep has improved since decreasing her steroid dosage, but she now feels excessively sleepy. She is currently on a lower dosage of IV steroids at 4 mg, having previously been on 10 mg.     ALLERGIES:  is allergic to shellfish allergy.  MEDICATIONS:  Current Outpatient Medications  Medication Sig Dispense Refill   clindamycin -benzoyl peroxide  (BENZACLIN) gel Apply topically 2 (two) times daily. 50 g 1   dexamethasone  (DECADRON ) 4 MG tablet With Adriamycin Cytoxan take 1 tablet day after chemo and 1 tablet 2 days after chemo with food 8 tablet 0   hydrochlorothiazide  (HYDRODIURIL ) 12.5 MG tablet Take 1 tablet (12.5 mg total) by mouth daily. 90 tablet 4   imipramine  (TOFRANIL ) 25 MG tablet Take 3 tablets (75 mg total) by mouth daily. 90 tablet 5   lidocaine -prilocaine  (EMLA ) cream Apply to affected area once 30 g 3   ondansetron  (ZOFRAN ) 8 MG tablet Take 1 tablet (8 mg total) by mouth every 8 (eight) hours as needed  for nausea or vomiting. Start on the third day after chemotherapy. 30 tablet 1   prochlorperazine  (COMPAZINE ) 10 MG tablet Take 1 tablet (10 mg total) by mouth every 6 (six) hours as needed for nausea or vomiting. 30 tablet 1    rizatriptan  (MAXALT ) 10 MG tablet Take 1 tablet (10 mg total) by mouth as needed. may repeat once after 2 hours 4 tablet 5   rosuvastatin  (CRESTOR ) 10 MG tablet Take 1 tablet (10 mg total) by mouth daily. 90 tablet 1   No current facility-administered medications for this visit.    PHYSICAL EXAMINATION: ECOG PERFORMANCE STATUS: 1 - Symptomatic but completely ambulatory  Vitals:   12/18/23 0932  BP: 124/74  Pulse: 100  Resp: 18  Temp: 98.3 F (36.8 C)  SpO2: 100%   Filed Weights   12/18/23 0932  Weight: 182 lb 8 oz (82.8 kg)    Physical Exam MUSCULOSKELETAL: Right knee swollen.  (exam performed in the presence of a chaperone)  LABORATORY DATA:  I have reviewed the data as listed    Latest Ref Rng & Units 12/11/2023    7:35 AM 12/09/2023    9:20 AM 12/04/2023    9:37 AM  CMP  Glucose 70 - 99 mg/dL 868  893  867   BUN 6 - 20 mg/dL 14  15  15    Creatinine 0.44 - 1.00 mg/dL 9.36  9.30  9.39   Sodium 135 - 145 mmol/L 140  136  137   Potassium 3.5 - 5.1 mmol/L 3.3  3.7  3.4   Chloride 98 - 111 mmol/L 105  101  102   CO2 22 - 32 mmol/L 27  30  29    Calcium  8.9 - 10.3 mg/dL 9.1  9.4  9.3   Total Protein 6.5 - 8.1 g/dL 6.6   6.8   Total Bilirubin 0.0 - 1.2 mg/dL 0.3   0.4   Alkaline Phos 38 - 126 U/L 71   72   AST 15 - 41 U/L 13   17   ALT 0 - 44 U/L 20   24     Lab Results  Component Value Date   WBC 4.3 12/18/2023   HGB 11.5 (L) 12/18/2023   HCT 34.0 (L) 12/18/2023   MCV 83.3 12/18/2023   PLT 242 12/18/2023   NEUTROABS 3.2 12/18/2023    ASSESSMENT & PLAN:  Malignant neoplasm of upper-outer quadrant of right breast in female, estrogen receptor positive (HCC) 10/07/2023:Screening mammogram detected right breast mass at 12:00 measuring 5.1 cm, 1 axillary lymph node biopsy was benign biopsy of the mass grade 3 IDC with necrosis ER 30% weak, PR 0%, Ki67 60%, HER2 negative   Treatment plan: Neoadjuvant chemotherapy with Taxol  carbo Keytruda  followed by Adriamycin  Cytoxan Keytruda  Breast conserving surgery with sentinel lymph node biopsy Adjuvant radiation therapy Antiestrogen therapy if the final path is estrogen receptor positive CT CAP and bone scan 11/04/2023: 4.7 cm right breast mass, right axillary lymph node 1.8 cm, 0.2 cm lung nodule benign Breast MRI 10/30/2023: Right breast malignancy 4.7 cm, enlarged right axillary lymph node ------------------------------------------------------------------------------------------------------------------------------------------------ Current treatment: Cycle 7 Taxol  carbo Keytruda  Chemo toxicities: Hypokalemia: Mild encouraged her to eat potassium containing foods  scalp pustules and folliculitis: She was prescribed Bactrim  by her PCP. Carpal tunnel symptoms in both hands: Not related to Taxol .  Monitoring closely   Return to clinic weekly for chemotherapy ------------------------------------- Assessment and Plan Assessment & Plan Steroid management Decreased dosage to 4 mg improved sleep  but caused mild nausea postprandially. - Increase dosage to 6 mg to manage symptoms.  Arthritis of knee Right knee pain and swelling likely due to arthritis. - Continue self-management with elevation and hot compresses.  Peripheral neuropathy Intermittent tingling in fingers, manageable and not affecting daily activities.      No orders of the defined types were placed in this encounter.  The patient has a good understanding of the overall plan. she agrees with it. she will call with any problems that may develop before the next visit here. Total time spent: 30 mins including face to face time and time spent for planning, charting and co-ordination of care   Gabrielle K Taryne Kiger, MD 12/18/23

## 2023-12-19 ENCOUNTER — Encounter: Payer: Self-pay | Admitting: Hematology and Oncology

## 2023-12-19 ENCOUNTER — Telehealth: Payer: Self-pay

## 2023-12-19 DIAGNOSIS — Z170 Estrogen receptor positive status [ER+]: Secondary | ICD-10-CM

## 2023-12-19 LAB — T4: T4, Total: 5 ug/dL (ref 4.5–12.0)

## 2023-12-19 NOTE — Telephone Encounter (Signed)
 [name], ICE COMPRESS: RANDOMIZED TRIAL OF LIMB CRYOCOMPRESSION VERSUS CONTINUOUS COMPRESSION VERSUS LOW CYCLIC COMPRESSION FOR THE PREVENTION OF TAXANE-INDUCED PERIPHERAL NEUROPATHY   Mrs. Mantey was contacted to follow up Week 6 Assessments. Pt reported that peripheral sensory neuropathy symptoms began one week ago, affecting both hands, predominantly the thumbs. She described the symptoms as tingling and numbness. Pt stated that at this time she has not started any prescribed medications, vitamins, supplements, or complementary/alternative therapies for management of peripheral neuropathy.   Pt was thanked for her time and participation.   Derra Shartzer, Ph.D. Clinical Research Coordinator [phone] 12/19/2023 2:56 PM

## 2023-12-23 ENCOUNTER — Encounter: Payer: Self-pay | Admitting: Hematology and Oncology

## 2023-12-23 ENCOUNTER — Other Ambulatory Visit (HOSPITAL_COMMUNITY): Payer: Self-pay

## 2023-12-24 NOTE — Assessment & Plan Note (Signed)
 10/07/2023:Screening mammogram detected right breast mass at 12:00 measuring 5.1 cm, 1 axillary lymph node biopsy was benign biopsy of the mass grade 3 IDC with necrosis ER 30% weak, PR 0%, Ki67 60%, HER2 negative   Treatment plan: Neoadjuvant chemotherapy with Taxol  carbo Keytruda  followed by Adriamycin Cytoxan Keytruda  Breast conserving surgery with sentinel lymph node biopsy Adjuvant radiation therapy Antiestrogen therapy if the final path is estrogen receptor positive CT CAP and bone scan 11/04/2023: 4.7 cm right breast mass, right axillary lymph node 1.8 cm, 0.2 cm lung nodule benign Breast MRI 10/30/2023: Right breast malignancy 4.7 cm, enlarged right axillary lymph node ------------------------------------------------------------------------------------------------------------------------------------------------ Current treatment: Cycle 8 Taxol  carbo Keytruda  Chemo toxicities: Hypokalemia: Mild encouraged her to eat potassium containing foods  scalp pustules and folliculitis: She was prescribed Bactrim  by her PCP. Carpal tunnel symptoms in both hands: Not related to Taxol .  Monitoring closely   Return to clinic weekly for chemotherapy

## 2023-12-25 ENCOUNTER — Inpatient Hospital Stay

## 2023-12-25 ENCOUNTER — Inpatient Hospital Stay (HOSPITAL_BASED_OUTPATIENT_CLINIC_OR_DEPARTMENT_OTHER): Admitting: Hematology and Oncology

## 2023-12-25 ENCOUNTER — Encounter: Payer: Self-pay | Admitting: Hematology and Oncology

## 2023-12-25 VITALS — BP 127/64 | HR 85 | Temp 98.3°F | Resp 17 | Ht 60.0 in | Wt 183.8 lb

## 2023-12-25 VITALS — BP 120/80 | HR 84 | Resp 16

## 2023-12-25 DIAGNOSIS — Z170 Estrogen receptor positive status [ER+]: Secondary | ICD-10-CM | POA: Diagnosis not present

## 2023-12-25 DIAGNOSIS — Z1732 Human epidermal growth factor receptor 2 negative status: Secondary | ICD-10-CM | POA: Diagnosis not present

## 2023-12-25 DIAGNOSIS — Z5112 Encounter for antineoplastic immunotherapy: Secondary | ICD-10-CM | POA: Diagnosis not present

## 2023-12-25 DIAGNOSIS — Z79899 Other long term (current) drug therapy: Secondary | ICD-10-CM | POA: Diagnosis not present

## 2023-12-25 DIAGNOSIS — Z95828 Presence of other vascular implants and grafts: Secondary | ICD-10-CM

## 2023-12-25 DIAGNOSIS — Z171 Estrogen receptor negative status [ER-]: Secondary | ICD-10-CM | POA: Diagnosis not present

## 2023-12-25 DIAGNOSIS — C50411 Malignant neoplasm of upper-outer quadrant of right female breast: Secondary | ICD-10-CM

## 2023-12-25 DIAGNOSIS — Z1721 Progesterone receptor positive status: Secondary | ICD-10-CM | POA: Diagnosis not present

## 2023-12-25 DIAGNOSIS — Z79620 Long term (current) use of immunosuppressive biologic: Secondary | ICD-10-CM | POA: Diagnosis not present

## 2023-12-25 DIAGNOSIS — Z5111 Encounter for antineoplastic chemotherapy: Secondary | ICD-10-CM | POA: Diagnosis not present

## 2023-12-25 LAB — CMP (CANCER CENTER ONLY)
ALT: 23 U/L (ref 0–44)
AST: 16 U/L (ref 15–41)
Albumin: 3.8 g/dL (ref 3.5–5.0)
Alkaline Phosphatase: 58 U/L (ref 38–126)
Anion gap: 6 (ref 5–15)
BUN: 13 mg/dL (ref 6–20)
CO2: 27 mmol/L (ref 22–32)
Calcium: 8.8 mg/dL — ABNORMAL LOW (ref 8.9–10.3)
Chloride: 105 mmol/L (ref 98–111)
Creatinine: 0.66 mg/dL (ref 0.44–1.00)
GFR, Estimated: >60 mL/min (ref >60)
Glucose, Bld: 112 mg/dL — ABNORMAL HIGH (ref 70–99)
Potassium: 3.4 mmol/L — ABNORMAL LOW (ref 3.5–5.1)
Sodium: 138 mmol/L (ref 135–145)
Total Bilirubin: 0.3 mg/dL (ref 0.0–1.2)
Total Protein: 6.4 g/dL — ABNORMAL LOW (ref 6.5–8.1)

## 2023-12-25 LAB — CBC WITH DIFFERENTIAL (CANCER CENTER ONLY)
Abs Immature Granulocytes: 0.08 K/uL — ABNORMAL HIGH (ref 0.00–0.07)
Basophils Absolute: 0 K/uL (ref 0.0–0.1)
Basophils Relative: 1 %
Eosinophils Absolute: 0 K/uL (ref 0.0–0.5)
Eosinophils Relative: 0 %
HCT: 33.7 % — ABNORMAL LOW (ref 36.0–46.0)
Hemoglobin: 11.2 g/dL — ABNORMAL LOW (ref 12.0–15.0)
Immature Granulocytes: 2 %
Lymphocytes Relative: 39 %
Lymphs Abs: 1.5 K/uL (ref 0.7–4.0)
MCH: 28 pg (ref 26.0–34.0)
MCHC: 33.2 g/dL (ref 30.0–36.0)
MCV: 84.3 fL (ref 80.0–100.0)
Monocytes Absolute: 0.3 K/uL (ref 0.1–1.0)
Monocytes Relative: 7 %
Neutro Abs: 2 K/uL (ref 1.7–7.7)
Neutrophils Relative %: 51 %
Platelet Count: 274 K/uL (ref 150–400)
RBC: 4 MIL/uL (ref 3.87–5.11)
RDW: 15 % (ref 11.5–15.5)
WBC Count: 3.9 K/uL — ABNORMAL LOW (ref 4.0–10.5)
nRBC: 0 % (ref 0.0–0.2)

## 2023-12-25 MED ORDER — PALONOSETRON HCL INJECTION 0.25 MG/5ML
0.2500 mg | Freq: Once | INTRAVENOUS | Status: AC
  Administered 2023-12-25: 0.25 mg via INTRAVENOUS
  Filled 2023-12-25: qty 5

## 2023-12-25 MED ORDER — DIPHENHYDRAMINE HCL 50 MG/ML IJ SOLN
50.0000 mg | Freq: Once | INTRAMUSCULAR | Status: AC
  Administered 2023-12-25: 50 mg via INTRAVENOUS
  Filled 2023-12-25: qty 1

## 2023-12-25 MED ORDER — FAMOTIDINE IN NACL 20-0.9 MG/50ML-% IV SOLN
20.0000 mg | Freq: Once | INTRAVENOUS | Status: AC
  Administered 2023-12-25: 20 mg via INTRAVENOUS
  Filled 2023-12-25: qty 50

## 2023-12-25 MED ORDER — SODIUM CHLORIDE 0.9% FLUSH
10.0000 mL | Freq: Once | INTRAVENOUS | Status: AC
  Administered 2023-12-25: 10 mL

## 2023-12-25 MED ORDER — DEXAMETHASONE SODIUM PHOSPHATE 10 MG/ML IJ SOLN
6.0000 mg | Freq: Once | INTRAMUSCULAR | Status: AC
  Administered 2023-12-25: 6 mg via INTRAVENOUS
  Filled 2023-12-25: qty 1

## 2023-12-25 MED ORDER — SODIUM CHLORIDE 0.9 % IV SOLN: INTRAVENOUS | Status: DC

## 2023-12-25 MED ORDER — SODIUM CHLORIDE 0.9 % IV SOLN
192.9000 mg | Freq: Once | INTRAVENOUS | Status: AC
  Administered 2023-12-25: 190 mg via INTRAVENOUS
  Filled 2023-12-25: qty 19

## 2023-12-25 MED ORDER — HEPARIN SOD (PORK) LOCK FLUSH 100 UNIT/ML IV SOLN: 500.0000 [IU] | Freq: Once | INTRAVENOUS | Status: DC | PRN

## 2023-12-25 MED ORDER — SODIUM CHLORIDE 0.9% FLUSH: 10.0000 mL | INTRAVENOUS | Status: DC | PRN

## 2023-12-25 MED ORDER — SODIUM CHLORIDE 0.9 % IV SOLN
80.0000 mg/m2 | Freq: Once | INTRAVENOUS | Status: AC
  Administered 2023-12-25: 150 mg via INTRAVENOUS
  Filled 2023-12-25: qty 25

## 2023-12-25 NOTE — Patient Instructions (Signed)

## 2023-12-25 NOTE — Progress Notes (Signed)
 Patient Care Team: [name], NP as PCP - General (Obstetrics and Gynecology) Lavoie, Marie-Lyne, MD as Consulting Physician (Obstetrics and Gynecology) Odean Potts, MD as Consulting Physician (Hematology and Oncology) Glean Stephane BROCKS, RN (Inactive) as Oncology Nurse Navigator Tyree Nanetta SAILOR, RN as Oncology Nurse Navigator Curvin Deward MOULD, MD as Consulting Physician (General Surgery) Izell Domino, MD as Attending Physician (Radiation Oncology)  DIAGNOSIS:  Encounter Diagnosis  Name Primary?   Malignant neoplasm of upper-outer quadrant of right breast in female, estrogen receptor positive (HCC) Yes    SUMMARY OF ONCOLOGIC HISTORY: Oncology History  Malignant neoplasm of upper-outer quadrant of right breast in female, estrogen receptor positive (HCC)  10/07/2023 Initial Diagnosis   Screening mammogram detected right breast mass at 12:00 measuring 5.1 cm, 1 axillary lymph node biopsy was benign biopsy of the mass grade 3 IDC with necrosis ER 30% weak, PR 0%, Ki67 60%, HER2 negative   10/22/2023 Cancer Staging   Staging form: Breast, AJCC 8th Edition - Clinical stage from 10/22/2023: Stage IIIA (cT3, cN0, cM0, G3, ER+, PR-, HER2-) - Signed by Odean Potts, MD on 10/22/2023 Stage prefix: Initial diagnosis Histologic grading system: 3 grade system Laterality: Right Staged by: Pathologist and managing physician Stage used in treatment planning: Yes National guidelines used in treatment planning: Yes Type of national guideline used in treatment planning: NCCN   11/02/2023 Genetic Testing   Negative [facility] CancerNext +RNAinsight Panel.  VUS in BARD1 at p.K438E (c.1312A>G).  Report date is 11/02/2023.   The [facility] CancerNext+RNAinsight Panel includes sequencing, rearrangement analysis, and RNA analysis for the following 40 genes: APC, ATM, BAP1, BARD1, BMPR1A, BRCA1, BRCA2, BRIP1, CDH1, CDKN2A, CHEK2, FH, FLCN, MET, MLH1, MSH2, MSH6, MUTYH, NF1, NTHL1, PALB2, PMS2, PTEN, RAD51C,  RAD51D, RSP20, SMAD4, STK11, TP53, TSC1, TSC2, and VHL (sequencing and deletion/duplication); AXIN2, HOXB13, MBD4, MSH3, POLD1 and POLE (sequencing only); EPCAM and GREM1 (deletion/duplication only).   11/06/2023 -  Chemotherapy   Patient is on Treatment Plan : BREAST Pembrolizumab  (200) D1 + Carboplatin  (1.5) D1,8,15 + Paclitaxel  (80) D1,8,15 q21d X 4 cycles / Pembrolizumab  (200) D1 + AC D1 q21d x 4 cycles       CHIEF COMPLIANT: Cycle 8 Taxol  carboplatin   HISTORY OF PRESENT ILLNESS:  History of Present Illness Gabrielle Cross is a 58 year old female who presents for a follow-up visit regarding her hematological condition.  She experiences persistent fatigue, which remains unchanged since the last visit. Numbness is present in her fingers, particularly the index finger, with no symptoms in her toes. She performs finger exercises to alleviate the numbness, and the symptoms have not worsened.     ALLERGIES:  is allergic to shellfish allergy.  MEDICATIONS:  Current Outpatient Medications  Medication Sig Dispense Refill   clindamycin -benzoyl peroxide  (BENZACLIN) gel Apply topically 2 (two) times daily. 50 g 1   dexamethasone  (DECADRON ) 4 MG tablet With Adriamycin Cytoxan take 1 tablet day after chemo and 1 tablet 2 days after chemo with food 8 tablet 0   hydrochlorothiazide  (HYDRODIURIL ) 12.5 MG tablet Take 1 tablet (12.5 mg total) by mouth daily. 90 tablet 4   imipramine  (TOFRANIL ) 25 MG tablet Take 3 tablets (75 mg total) by mouth daily. 90 tablet 5   lidocaine -prilocaine  (EMLA ) cream Apply to affected area once 30 g 3   ondansetron  (ZOFRAN ) 8 MG tablet Take 1 tablet (8 mg total) by mouth every 8 (eight) hours as needed for nausea or vomiting. Start on the third day after chemotherapy. 30 tablet 1  prochlorperazine  (COMPAZINE ) 10 MG tablet Take 1 tablet (10 mg total) by mouth every 6 (six) hours as needed for nausea or vomiting. 30 tablet 1   rizatriptan  (MAXALT ) 10 MG tablet Take 1 tablet  (10 mg total) by mouth as needed. may repeat once after 2 hours 4 tablet 5   rosuvastatin  (CRESTOR ) 10 MG tablet Take 1 tablet (10 mg total) by mouth daily. 90 tablet 1   No current facility-administered medications for this visit.    PHYSICAL EXAMINATION: ECOG PERFORMANCE STATUS: 1 - Symptomatic but completely ambulatory  Vitals:   12/25/23 0827  BP: 127/64  Pulse: 85  Resp: 17  Temp: 98.3 F (36.8 C)  SpO2: 99%   Filed Weights   12/25/23 0827  Weight: 183 lb 12.8 oz (83.4 kg)    Physical Exam Mild peripheral neuropathy stable  (exam performed in the presence of a chaperone)  LABORATORY DATA:  I have reviewed the data as listed    Latest Ref Rng & Units 12/18/2023    9:03 AM 12/11/2023    7:35 AM 12/09/2023    9:20 AM  CMP  Glucose 70 - 99 mg/dL 878  868  893   BUN 6 - 20 mg/dL 17  14  15    Creatinine 0.44 - 1.00 mg/dL 9.15  9.36  9.30   Sodium 135 - 145 mmol/L 136  140  136   Potassium 3.5 - 5.1 mmol/L 3.9  3.3  3.7   Chloride 98 - 111 mmol/L 104  105  101   CO2 22 - 32 mmol/L 26  27  30    Calcium  8.9 - 10.3 mg/dL 8.9  9.1  9.4   Total Protein 6.5 - 8.1 g/dL 6.5  6.6    Total Bilirubin 0.0 - 1.2 mg/dL 0.3  0.3    Alkaline Phos 38 - 126 U/L 67  71    AST 15 - 41 U/L 17  13    ALT 0 - 44 U/L 25  20      Lab Results  Component Value Date   WBC 3.9 (L) 12/25/2023   HGB 11.2 (L) 12/25/2023   HCT 33.7 (L) 12/25/2023   MCV 84.3 12/25/2023   PLT 274 12/25/2023   NEUTROABS 2.0 12/25/2023    ASSESSMENT & PLAN:  Malignant neoplasm of upper-outer quadrant of right breast in female, estrogen receptor positive (HCC) 10/07/2023:Screening mammogram detected right breast mass at 12:00 measuring 5.1 cm, 1 axillary lymph node biopsy was benign biopsy of the mass grade 3 IDC with necrosis ER 30% weak, PR 0%, Ki67 60%, HER2 negative   Treatment plan: Neoadjuvant chemotherapy with Taxol  carbo Keytruda  followed by Adriamycin Cytoxan Keytruda  Breast conserving surgery with  sentinel lymph node biopsy Adjuvant radiation therapy Antiestrogen therapy if the final path is estrogen receptor positive CT CAP and bone scan 11/04/2023: 4.7 cm right breast mass, right axillary lymph node 1.8 cm, 0.2 cm lung nodule benign Breast MRI 10/30/2023: Right breast malignancy 4.7 cm, enlarged right axillary lymph node ------------------------------------------------------------------------------------------------------------------------------------------------ Current treatment: Cycle 8 Taxol  carbo Keytruda  Chemo toxicities: Hypokalemia: Mild encouraged her to eat potassium containing foods  scalp pustules and folliculitis: She was prescribed Bactrim  by her PCP. Carpal tunnel symptoms in both hands: Not related to Taxol .  Monitoring closely   Return to clinic weekly for chemotherapy  Assessment & Plan Leukopenia White blood cell count is 3.94, neutrophil count is 2.0. Monitoring required for potential decrease. - Monitor white blood cell and neutrophil counts. - Adjust medication  doses if white count decreases.  Anemia Hemoglobin is 11.2, slightly low but acceptable. Platelet count stable. - Monitor hemoglobin and platelet levels.  Peripheral neuropathy Numbness in fingers, especially index finger, managed with exercises. - Continue current management and symptom monitoring. - Ensure hydration at 80 ounces of water per day.      No orders of the defined types were placed in this encounter.  The patient has a good understanding of the overall plan. she agrees with it. she will call with any problems that may develop before the next visit here. Total time spent: 30 mins including face to face time and time spent for planning, charting and co-ordination of care   Naomi MARLA Chad, MD 12/25/23

## 2023-12-29 ENCOUNTER — Telehealth: Payer: Self-pay | Admitting: Hematology and Oncology

## 2023-12-29 NOTE — Telephone Encounter (Signed)
 Gabrielle Cross called in to schedule more infusion appts. I have scheduled her for 8/14.

## 2023-12-31 ENCOUNTER — Encounter: Payer: Self-pay | Admitting: Hematology and Oncology

## 2024-01-01 ENCOUNTER — Inpatient Hospital Stay

## 2024-01-01 ENCOUNTER — Encounter: Payer: Self-pay | Admitting: *Deleted

## 2024-01-01 ENCOUNTER — Inpatient Hospital Stay (HOSPITAL_BASED_OUTPATIENT_CLINIC_OR_DEPARTMENT_OTHER): Admitting: Hematology and Oncology

## 2024-01-01 VITALS — BP 113/64 | HR 63 | Temp 97.8°F | Resp 18 | Wt 184.0 lb

## 2024-01-01 DIAGNOSIS — Z79620 Long term (current) use of immunosuppressive biologic: Secondary | ICD-10-CM | POA: Diagnosis not present

## 2024-01-01 DIAGNOSIS — Z5111 Encounter for antineoplastic chemotherapy: Secondary | ICD-10-CM | POA: Diagnosis not present

## 2024-01-01 DIAGNOSIS — Z1721 Progesterone receptor positive status: Secondary | ICD-10-CM | POA: Diagnosis not present

## 2024-01-01 DIAGNOSIS — Z170 Estrogen receptor positive status [ER+]: Secondary | ICD-10-CM | POA: Diagnosis not present

## 2024-01-01 DIAGNOSIS — C50411 Malignant neoplasm of upper-outer quadrant of right female breast: Secondary | ICD-10-CM

## 2024-01-01 DIAGNOSIS — Z95828 Presence of other vascular implants and grafts: Secondary | ICD-10-CM

## 2024-01-01 DIAGNOSIS — Z79899 Other long term (current) drug therapy: Secondary | ICD-10-CM | POA: Diagnosis not present

## 2024-01-01 DIAGNOSIS — Z5112 Encounter for antineoplastic immunotherapy: Secondary | ICD-10-CM | POA: Diagnosis not present

## 2024-01-01 DIAGNOSIS — Z171 Estrogen receptor negative status [ER-]: Secondary | ICD-10-CM | POA: Diagnosis not present

## 2024-01-01 DIAGNOSIS — Z1732 Human epidermal growth factor receptor 2 negative status: Secondary | ICD-10-CM | POA: Diagnosis not present

## 2024-01-01 LAB — CBC WITH DIFFERENTIAL (CANCER CENTER ONLY)
Abs Immature Granulocytes: 0.04 K/uL (ref 0.00–0.07)
Basophils Absolute: 0 K/uL (ref 0.0–0.1)
Basophils Relative: 0 %
Eosinophils Absolute: 0 K/uL (ref 0.0–0.5)
Eosinophils Relative: 0 %
HCT: 33.4 % — ABNORMAL LOW (ref 36.0–46.0)
Hemoglobin: 11.4 g/dL — ABNORMAL LOW (ref 12.0–15.0)
Immature Granulocytes: 1 %
Lymphocytes Relative: 43 %
Lymphs Abs: 1.5 K/uL (ref 0.7–4.0)
MCH: 28.8 pg (ref 26.0–34.0)
MCHC: 34.1 g/dL (ref 30.0–36.0)
MCV: 84.3 fL (ref 80.0–100.0)
Monocytes Absolute: 0.4 K/uL (ref 0.1–1.0)
Monocytes Relative: 10 %
Neutro Abs: 1.7 K/uL (ref 1.7–7.7)
Neutrophils Relative %: 46 %
Platelet Count: 250 K/uL (ref 150–400)
RBC: 3.96 MIL/uL (ref 3.87–5.11)
RDW: 15.5 % (ref 11.5–15.5)
WBC Count: 3.6 K/uL — ABNORMAL LOW (ref 4.0–10.5)
nRBC: 0 % (ref 0.0–0.2)

## 2024-01-01 LAB — CMP (CANCER CENTER ONLY)
ALT: 25 U/L (ref 0–44)
AST: 17 U/L (ref 15–41)
Albumin: 3.9 g/dL (ref 3.5–5.0)
Alkaline Phosphatase: 60 U/L (ref 38–126)
Anion gap: 8 (ref 5–15)
BUN: 14 mg/dL (ref 6–20)
CO2: 26 mmol/L (ref 22–32)
Calcium: 8.8 mg/dL — ABNORMAL LOW (ref 8.9–10.3)
Chloride: 104 mmol/L (ref 98–111)
Creatinine: 0.64 mg/dL (ref 0.44–1.00)
GFR, Estimated: >60 mL/min (ref >60)
Glucose, Bld: 122 mg/dL — ABNORMAL HIGH (ref 70–99)
Potassium: 3.6 mmol/L (ref 3.5–5.1)
Sodium: 138 mmol/L (ref 135–145)
Total Bilirubin: 0.4 mg/dL (ref 0.0–1.2)
Total Protein: 6.8 g/dL (ref 6.5–8.1)

## 2024-01-01 MED ORDER — SODIUM CHLORIDE 0.9 % IV SOLN
80.0000 mg/m2 | Freq: Once | INTRAVENOUS | Status: AC
  Administered 2024-01-01: 150 mg via INTRAVENOUS
  Filled 2024-01-01: qty 25

## 2024-01-01 MED ORDER — DIPHENHYDRAMINE HCL 50 MG/ML IJ SOLN
50.0000 mg | Freq: Once | INTRAMUSCULAR | Status: AC
  Administered 2024-01-01: 50 mg via INTRAVENOUS
  Filled 2024-01-01: qty 1

## 2024-01-01 MED ORDER — PALONOSETRON HCL INJECTION 0.25 MG/5ML
0.2500 mg | Freq: Once | INTRAVENOUS | Status: AC
  Administered 2024-01-01: 0.25 mg via INTRAVENOUS
  Filled 2024-01-01: qty 5

## 2024-01-01 MED ORDER — SODIUM CHLORIDE 0.9 % IV SOLN: INTRAVENOUS | Status: DC

## 2024-01-01 MED ORDER — SODIUM CHLORIDE 0.9% FLUSH
10.0000 mL | Freq: Once | INTRAVENOUS | Status: AC
  Administered 2024-01-01: 10 mL

## 2024-01-01 MED ORDER — FAMOTIDINE IN NACL 20-0.9 MG/50ML-% IV SOLN
20.0000 mg | Freq: Once | INTRAVENOUS | Status: AC
  Administered 2024-01-01: 20 mg via INTRAVENOUS
  Filled 2024-01-01: qty 50

## 2024-01-01 MED ORDER — SODIUM CHLORIDE 0.9 % IV SOLN
192.9000 mg | Freq: Once | INTRAVENOUS | Status: AC
  Administered 2024-01-01: 190 mg via INTRAVENOUS
  Filled 2024-01-01: qty 19

## 2024-01-01 MED ORDER — DEXAMETHASONE SODIUM PHOSPHATE 10 MG/ML IJ SOLN
6.0000 mg | Freq: Once | INTRAMUSCULAR | Status: AC
  Administered 2024-01-01: 6 mg via INTRAVENOUS
  Filled 2024-01-01: qty 1

## 2024-01-01 NOTE — Progress Notes (Signed)
 Patient Care Team: [name], NP as PCP - General (Obstetrics and Gynecology) Lavoie, Marie-Lyne, MD as Consulting Physician (Obstetrics and Gynecology) Odean Potts, MD as Consulting Physician (Hematology and Oncology) Glean Stephane BROCKS, RN (Inactive) as Oncology Nurse Navigator Tyree Nanetta SAILOR, RN as Oncology Nurse Navigator Curvin Deward MOULD, MD as Consulting Physician (General Surgery) Izell Domino, MD as Attending Physician (Radiation Oncology)  DIAGNOSIS:  Encounter Diagnosis  Name Primary?   Malignant neoplasm of upper-outer quadrant of right breast in female, estrogen receptor positive (HCC) Yes    SUMMARY OF ONCOLOGIC HISTORY: Oncology History  Malignant neoplasm of upper-outer quadrant of right breast in female, estrogen receptor positive (HCC)  10/07/2023 Initial Diagnosis   Screening mammogram detected right breast mass at 12:00 measuring 5.1 cm, 1 axillary lymph node biopsy was benign biopsy of the mass grade 3 IDC with necrosis ER 30% weak, PR 0%, Ki67 60%, HER2 negative   10/22/2023 Cancer Staging   Staging form: Breast, AJCC 8th Edition - Clinical stage from 10/22/2023: Stage IIIA (cT3, cN0, cM0, G3, ER+, PR-, HER2-) - Signed by Odean Potts, MD on 10/22/2023 Stage prefix: Initial diagnosis Histologic grading system: 3 grade system Laterality: Right Staged by: Pathologist and managing physician Stage used in treatment planning: Yes National guidelines used in treatment planning: Yes Type of national guideline used in treatment planning: NCCN   11/02/2023 Genetic Testing   Negative [facility] Cancer[facility] +RNAinsight Panel.  VUS in BARD1 at p.K438E (c.1312A>G).  Report date is 11/02/2023.   The [facility] CancerNext+RNAinsight Panel includes sequencing, rearrangement analysis, and RNA analysis for the following 40 genes: APC, ATM, BAP1, BARD1, BMPR1A, BRCA1, BRCA2, BRIP1, CDH1, CDKN2A, CHEK2, FH, FLCN, MET, MLH1, MSH2, MSH6, MUTYH, NF1, NTHL1, PALB2, PMS2, PTEN, RAD51C,  RAD51D, RSP20, SMAD4, STK11, TP53, TSC1, TSC2, and VHL (sequencing and deletion/duplication); AXIN2, HOXB13, MBD4, MSH3, POLD1 and POLE (sequencing only); EPCAM and GREM1 (deletion/duplication only).   11/06/2023 -  Chemotherapy   Patient is on Treatment Plan : BREAST Pembrolizumab  (200) D1 + Carboplatin  (1.5) D1,8,15 + Paclitaxel  (80) D1,8,15 q21d X 4 cycles / Pembrolizumab  (200) D1 + AC D1 q21d x 4 cycles       CHIEF COMPLIANT: Cycle 9 Taxol  carbo Keytruda   HISTORY OF PRESENT ILLNESS:  History of Present Illness Gabrielle Cross is a 58 year old female undergoing chemotherapy who presents for a routine follow-up.  She experiences ongoing numbness and tingling in the middle three fingers, which are persistent but stable. There are no symptoms in her feet. Her chemotherapy regimen includes infusions with a recent adjustment to avoid consecutive five-hour sessions, now scheduled on Thursdays. Her recent complete blood count shows a slightly low white blood cell count, with a neutrophil count of 1.7.     ALLERGIES:  is allergic to shellfish allergy.  MEDICATIONS:  Current Outpatient Medications  Medication Sig Dispense Refill   dexamethasone  (DECADRON ) 4 MG tablet With Adriamycin Cytoxan take 1 tablet day after chemo and 1 tablet 2 days after chemo with food 8 tablet 0   hydrochlorothiazide  (HYDRODIURIL ) 12.5 MG tablet Take 1 tablet (12.5 mg total) by mouth daily. 90 tablet 4   imipramine  (TOFRANIL ) 25 MG tablet Take 3 tablets (75 mg total) by mouth daily. 90 tablet 5   lidocaine -prilocaine  (EMLA ) cream Apply to affected area once 30 g 3   ondansetron  (ZOFRAN ) 8 MG tablet Take 1 tablet (8 mg total) by mouth every 8 (eight) hours as needed for nausea or vomiting. Start on the third day after chemotherapy.  30 tablet 1   prochlorperazine  (COMPAZINE ) 10 MG tablet Take 1 tablet (10 mg total) by mouth every 6 (six) hours as needed for nausea or vomiting. 30 tablet 1   rizatriptan  (MAXALT ) 10 MG  tablet Take 1 tablet (10 mg total) by mouth as needed. may repeat once after 2 hours 4 tablet 5   rosuvastatin  (CRESTOR ) 10 MG tablet Take 1 tablet (10 mg total) by mouth daily. 90 tablet 1   clindamycin -benzoyl peroxide  (BENZACLIN) gel Apply topically 2 (two) times daily. (Patient not taking: Reported on 01/01/2024) 50 g 1   No current facility-administered medications for this visit.    PHYSICAL EXAMINATION: ECOG PERFORMANCE STATUS: 1 - Symptomatic but completely ambulatory  Vitals:   01/01/24 0800  BP: 113/64  Pulse: 63  Resp: 18  Temp: 97.8 F (36.6 C)  SpO2: 98%   Filed Weights   01/01/24 0800  Weight: 184 lb (83.5 kg)      LABORATORY DATA:  I have reviewed the data as listed    Latest Ref Rng & Units 12/25/2023    8:03 AM 12/18/2023    9:03 AM 12/11/2023    7:35 AM  CMP  Glucose 70 - 99 mg/dL 887  878  868   BUN 6 - 20 mg/dL 13  17  14    Creatinine 0.44 - 1.00 mg/dL 9.33  9.15  9.36   Sodium 135 - 145 mmol/L 138  136  140   Potassium 3.5 - 5.1 mmol/L 3.4  3.9  3.3   Chloride 98 - 111 mmol/L 105  104  105   CO2 22 - 32 mmol/L 27  26  27    Calcium  8.9 - 10.3 mg/dL 8.8  8.9  9.1   Total Protein 6.5 - 8.1 g/dL 6.4  6.5  6.6   Total Bilirubin 0.0 - 1.2 mg/dL 0.3  0.3  0.3   Alkaline Phos 38 - 126 U/L 58  67  71   AST 15 - 41 U/L 16  17  13    ALT 0 - 44 U/L 23  25  20      Lab Results  Component Value Date   WBC 3.6 (L) 01/01/2024   HGB 11.4 (L) 01/01/2024   HCT 33.4 (L) 01/01/2024   MCV 84.3 01/01/2024   PLT 250 01/01/2024   NEUTROABS 1.7 01/01/2024    ASSESSMENT & PLAN:  Malignant neoplasm of upper-outer quadrant of right breast in female, estrogen receptor positive (HCC) 10/07/2023:Screening mammogram detected right breast mass at 12:00 measuring 5.1 cm, 1 axillary lymph node biopsy was benign biopsy of the mass grade 3 IDC with necrosis ER 30% weak, PR 0%, Ki67 60%, HER2 negative   Treatment plan: Neoadjuvant chemotherapy with Taxol  carbo Keytruda  followed  by Adriamycin Cytoxan Keytruda  Breast conserving surgery with sentinel lymph node biopsy Adjuvant radiation therapy Antiestrogen therapy if the final path is estrogen receptor positive CT CAP and bone scan 11/04/2023: 4.7 cm right breast mass, right axillary lymph node 1.8 cm, 0.2 cm lung nodule benign Breast MRI 10/30/2023: Right breast malignancy 4.7 cm, enlarged right axillary lymph node ------------------------------------------------------------------------------------------------------------------------------------------------ Current treatment: Cycle 9 Taxol  carbo Keytruda  Chemo toxicities: Hypokalemia: Mild.  Encouraged her to eat potassium containing foods  scalp pustules and folliculitis: Markedly improved and dried out Carpal tunnel symptoms in both hands: This is pre-existing but slightly worsened with Taxol .  Monitoring closely Slight leukopenia: Monitoring closely.  If the counts continue to decline we will consider lowering the dosage.  Okay to treat  if ANC is greater than 1.2.   Return to clinic weekly for chemotherapy ------------------------------------- Assessment and Plan Assessment & Plan ER-positive malignant neoplasm of upper-outer quadrant of right breast Undergoing chemotherapy with a treatment schedule that includes infusions. Keytruda  will be added on August 7th. The current cycle ends on August 21st, transitioning to a three-week cycle starting August 28th. - Ensure infusion schedule avoids consecutive five-hour infusions. - Continue current chemotherapy regimen, adding Keytruda  on August 7th. - Transition to a three-week treatment cycle starting August 28th.  Chemotherapy-induced leukopenia White blood cell count is slightly low, but neutrophil count is 1.7, above the critical threshold of 1.2, allowing continuation of the current chemotherapy regimen. - Monitor white blood cell and neutrophil counts closely. - Adjust chemotherapy dosage if counts drop too  low.  Chemotherapy-induced peripheral neuropathy Reports numbness and tingling in fingers, particularly affecting three fingers, with the middle finger recently joining. No issues with feet reported.      No orders of the defined types were placed in this encounter.  The patient has a good understanding of the overall plan. she agrees with it. she will call with any problems that may develop before the next visit here. Total time spent: 30 mins including face to face time and time spent for planning, charting and co-ordination of care   Viinay K Remmie Bembenek, MD 01/01/24

## 2024-01-01 NOTE — Assessment & Plan Note (Signed)
 10/07/2023:Screening mammogram detected right breast mass at 12:00 measuring 5.1 cm, 1 axillary lymph node biopsy was benign biopsy of the mass grade 3 IDC with necrosis ER 30% weak, PR 0%, Ki67 60%, HER2 negative   Treatment plan: Neoadjuvant chemotherapy with Taxol  carbo Keytruda  followed by Adriamycin Cytoxan Keytruda  Breast conserving surgery with sentinel lymph node biopsy Adjuvant radiation therapy Antiestrogen therapy if the final path is estrogen receptor positive CT CAP and bone scan 11/04/2023: 4.7 cm right breast mass, right axillary lymph node 1.8 cm, 0.2 cm lung nodule benign Breast MRI 10/30/2023: Right breast malignancy 4.7 cm, enlarged right axillary lymph node ------------------------------------------------------------------------------------------------------------------------------------------------ Current treatment: Cycle 9 Taxol  carbo Keytruda  Chemo toxicities: Hypokalemia: Mild encouraged her to eat potassium containing foods  scalp pustules and folliculitis: She was prescribed Bactrim  by her PCP. Carpal tunnel symptoms in both hands: Not related to Taxol .  Monitoring closely   Return to clinic weekly for chemotherapy

## 2024-01-07 ENCOUNTER — Encounter: Payer: Self-pay | Admitting: Hematology and Oncology

## 2024-01-07 ENCOUNTER — Telehealth: Payer: Self-pay

## 2024-01-07 NOTE — Telephone Encounter (Signed)
 Pt verbally confirmed appt for 8/7

## 2024-01-08 ENCOUNTER — Inpatient Hospital Stay (HOSPITAL_BASED_OUTPATIENT_CLINIC_OR_DEPARTMENT_OTHER): Admitting: Hematology and Oncology

## 2024-01-08 ENCOUNTER — Inpatient Hospital Stay

## 2024-01-08 ENCOUNTER — Encounter: Payer: Self-pay | Admitting: Hematology and Oncology

## 2024-01-08 ENCOUNTER — Telehealth: Payer: Self-pay | Admitting: *Deleted

## 2024-01-08 ENCOUNTER — Inpatient Hospital Stay: Attending: Hematology and Oncology

## 2024-01-08 VITALS — BP 121/70 | HR 87 | Temp 98.8°F | Resp 20 | Wt 181.9 lb

## 2024-01-08 DIAGNOSIS — C50411 Malignant neoplasm of upper-outer quadrant of right female breast: Secondary | ICD-10-CM | POA: Diagnosis not present

## 2024-01-08 DIAGNOSIS — Z1722 Progesterone receptor negative status: Secondary | ICD-10-CM | POA: Diagnosis not present

## 2024-01-08 DIAGNOSIS — Z170 Estrogen receptor positive status [ER+]: Secondary | ICD-10-CM | POA: Diagnosis not present

## 2024-01-08 DIAGNOSIS — Z1732 Human epidermal growth factor receptor 2 negative status: Secondary | ICD-10-CM | POA: Diagnosis not present

## 2024-01-08 DIAGNOSIS — Z5189 Encounter for other specified aftercare: Secondary | ICD-10-CM | POA: Diagnosis not present

## 2024-01-08 DIAGNOSIS — Z79899 Other long term (current) drug therapy: Secondary | ICD-10-CM | POA: Diagnosis not present

## 2024-01-08 DIAGNOSIS — Z95828 Presence of other vascular implants and grafts: Secondary | ICD-10-CM

## 2024-01-08 DIAGNOSIS — Z5112 Encounter for antineoplastic immunotherapy: Secondary | ICD-10-CM | POA: Diagnosis present

## 2024-01-08 DIAGNOSIS — Z5111 Encounter for antineoplastic chemotherapy: Secondary | ICD-10-CM | POA: Insufficient documentation

## 2024-01-08 LAB — CBC WITH DIFFERENTIAL (CANCER CENTER ONLY)
Abs Immature Granulocytes: 0.04 K/uL (ref 0.00–0.07)
Basophils Absolute: 0 K/uL (ref 0.0–0.1)
Basophils Relative: 1 %
Eosinophils Absolute: 0 K/uL (ref 0.0–0.5)
Eosinophils Relative: 0 %
HCT: 32.9 % — ABNORMAL LOW (ref 36.0–46.0)
Hemoglobin: 11.2 g/dL — ABNORMAL LOW (ref 12.0–15.0)
Immature Granulocytes: 1 %
Lymphocytes Relative: 40 %
Lymphs Abs: 1.2 K/uL (ref 0.7–4.0)
MCH: 28.8 pg (ref 26.0–34.0)
MCHC: 34 g/dL (ref 30.0–36.0)
MCV: 84.6 fL (ref 80.0–100.0)
Monocytes Absolute: 0.5 K/uL (ref 0.1–1.0)
Monocytes Relative: 14 %
Neutro Abs: 1.4 K/uL — ABNORMAL LOW (ref 1.7–7.7)
Neutrophils Relative %: 44 %
Platelet Count: 272 K/uL (ref 150–400)
RBC: 3.89 MIL/uL (ref 3.87–5.11)
RDW: 16.7 % — ABNORMAL HIGH (ref 11.5–15.5)
WBC Count: 3.1 K/uL — ABNORMAL LOW (ref 4.0–10.5)
nRBC: 0 % (ref 0.0–0.2)

## 2024-01-08 LAB — CMP (CANCER CENTER ONLY)
ALT: 25 U/L (ref 0–44)
AST: 17 U/L (ref 15–41)
Albumin: 3.9 g/dL (ref 3.5–5.0)
Alkaline Phosphatase: 58 U/L (ref 38–126)
Anion gap: 6 (ref 5–15)
BUN: 14 mg/dL (ref 6–20)
CO2: 29 mmol/L (ref 22–32)
Calcium: 8.9 mg/dL (ref 8.9–10.3)
Chloride: 101 mmol/L (ref 98–111)
Creatinine: 0.58 mg/dL (ref 0.44–1.00)
GFR, Estimated: >60 mL/min (ref >60)
Glucose, Bld: 100 mg/dL — ABNORMAL HIGH (ref 70–99)
Potassium: 3.7 mmol/L (ref 3.5–5.1)
Sodium: 136 mmol/L (ref 135–145)
Total Bilirubin: 0.4 mg/dL (ref 0.0–1.2)
Total Protein: 6.7 g/dL (ref 6.5–8.1)

## 2024-01-08 MED ORDER — DEXAMETHASONE SODIUM PHOSPHATE 10 MG/ML IJ SOLN
6.0000 mg | Freq: Once | INTRAMUSCULAR | Status: AC
  Administered 2024-01-08: 6 mg via INTRAVENOUS
  Filled 2024-01-08: qty 1

## 2024-01-08 MED ORDER — SODIUM CHLORIDE 0.9 % IV SOLN
192.9000 mg | Freq: Once | INTRAVENOUS | Status: AC
  Administered 2024-01-08: 190 mg via INTRAVENOUS
  Filled 2024-01-08: qty 19

## 2024-01-08 MED ORDER — SODIUM CHLORIDE 0.9 % IV SOLN
200.0000 mg | Freq: Once | INTRAVENOUS | Status: AC
  Administered 2024-01-08: 200 mg via INTRAVENOUS
  Filled 2024-01-08: qty 200

## 2024-01-08 MED ORDER — SODIUM CHLORIDE 0.9% FLUSH
10.0000 mL | Freq: Once | INTRAVENOUS | Status: AC
Start: 2024-01-08 — End: 2024-01-08
  Administered 2024-01-08: 10 mL

## 2024-01-08 MED ORDER — SODIUM CHLORIDE 0.9 % IV SOLN: INTRAVENOUS | Status: DC

## 2024-01-08 MED ORDER — PALONOSETRON HCL INJECTION 0.25 MG/5ML
0.2500 mg | Freq: Once | INTRAVENOUS | Status: AC
  Administered 2024-01-08: 0.25 mg via INTRAVENOUS
  Filled 2024-01-08: qty 5

## 2024-01-08 MED ORDER — FAMOTIDINE IN NACL 20-0.9 MG/50ML-% IV SOLN
20.0000 mg | Freq: Once | INTRAVENOUS | Status: AC
  Administered 2024-01-08: 20 mg via INTRAVENOUS
  Filled 2024-01-08: qty 50

## 2024-01-08 MED ORDER — SODIUM CHLORIDE 0.9 % IV SOLN
65.0000 mg/m2 | Freq: Once | INTRAVENOUS | Status: AC
  Administered 2024-01-08: 120 mg via INTRAVENOUS
  Filled 2024-01-08: qty 20

## 2024-01-08 MED ORDER — DIPHENHYDRAMINE HCL 50 MG/ML IJ SOLN
50.0000 mg | Freq: Once | INTRAMUSCULAR | Status: AC
  Administered 2024-01-08: 50 mg via INTRAVENOUS
  Filled 2024-01-08: qty 1

## 2024-01-08 NOTE — Patient Instructions (Signed)

## 2024-01-08 NOTE — Telephone Encounter (Signed)
 Proceed with treatment today per ANC of 1.4 per MD review.

## 2024-01-08 NOTE — Progress Notes (Signed)
 Patient Care Team: [name], NP as PCP - General (Obstetrics and Gynecology) Lavoie, Marie-Lyne, MD as Consulting Physician (Obstetrics and Gynecology) Odean Potts, MD as Consulting Physician (Hematology and Oncology) Glean Stephane BROCKS, RN (Inactive) as Oncology Nurse Navigator Tyree Nanetta SAILOR, RN as Oncology Nurse Navigator Curvin Deward MOULD, MD as Consulting Physician (General Surgery) Izell Domino, MD as Attending Physician (Radiation Oncology)  DIAGNOSIS:  No diagnosis found.   SUMMARY OF ONCOLOGIC HISTORY: Oncology History  Malignant neoplasm of upper-outer quadrant of right breast in female, estrogen receptor positive (HCC)  10/07/2023 Initial Diagnosis   Screening mammogram detected right breast mass at 12:00 measuring 5.1 cm, 1 axillary lymph node biopsy was benign biopsy of the mass grade 3 IDC with necrosis ER 30% weak, PR 0%, Ki67 60%, HER2 negative   10/22/2023 Cancer Staging   Staging form: Breast, AJCC 8th Edition - Clinical stage from 10/22/2023: Stage IIIA (cT3, cN0, cM0, G3, ER+, PR-, HER2-) - Signed by Odean Potts, MD on 10/22/2023 Stage prefix: Initial diagnosis Histologic grading system: 3 grade system Laterality: Right Staged by: Pathologist and managing physician Stage used in treatment planning: Yes National guidelines used in treatment planning: Yes Type of national guideline used in treatment planning: NCCN   11/02/2023 Genetic Testing   Negative [facility] Cancer[facility] +RNAinsight Panel.  VUS in BARD1 at p.K438E (c.1312A>G).  Report date is 11/02/2023.   The [facility] CancerNext+RNAinsight Panel includes sequencing, rearrangement analysis, and RNA analysis for the following 40 genes: APC, ATM, BAP1, BARD1, BMPR1A, BRCA1, BRCA2, BRIP1, CDH1, CDKN2A, CHEK2, FH, FLCN, MET, MLH1, MSH2, MSH6, MUTYH, NF1, NTHL1, PALB2, PMS2, PTEN, RAD51C, RAD51D, RSP20, SMAD4, STK11, TP53, TSC1, TSC2, and VHL (sequencing and deletion/duplication); AXIN2, HOXB13, MBD4, MSH3, POLD1 and  POLE (sequencing only); EPCAM and GREM1 (deletion/duplication only).   11/06/2023 -  Chemotherapy   Patient is on Treatment Plan : BREAST Pembrolizumab  (200) D1 + Carboplatin  (1.5) D1,8,15 + Paclitaxel  (80) D1,8,15 q21d X 4 cycles / Pembrolizumab  (200) D1 + AC D1 q21d x 4 cycles       CHIEF COMPLIANT: Cycle 9 Taxol  carbo Keytruda   HISTORY OF PRESENT ILLNESS:  History of Present Illness  Gabrielle Cross is a 58 year old female undergoing chemotherapy and immunotherapy for breast cancer who presents with neuropathy and treatment side effects.  She is currently on cycle four, day one of her chemotherapy and immunotherapy regimen for breast cancer. She experiences neuropathy, specifically tingling and numbness in three fingers on both hands. This began with two fingers and recently included the middle finger about two weeks ago. The neuropathy affects her ability to hold and type, leading to occasional dropping of objects and difficulty feeling the keyboard, impacting her work as she needs to type regularly.  No prior history of neuropathy before starting chemotherapy. She has not been prescribed any medication for neuropathy, such as gabapentin. She mentions a history of carpal tunnel symptoms, particularly affecting two fingers, but notes that the middle finger involvement is new and started after chemotherapy began.  She notes that the breast mass, initially measured at about five centimeters, may be shrinking, although she is uncertain. She also reports improvement in acne on her scalp. She experienced pain in her left breast over the past two days, but not today. Additionally, she reports discomfort around the port site, especially when it is cold, which she describes as a constant discomfort rather than pain.  No mouth sores, diarrhea, or any other significant symptoms at this time.  ALLERGIES:  is allergic to shellfish allergy.  MEDICATIONS:  Current Outpatient Medications   Medication Sig Dispense Refill   dexamethasone  (DECADRON ) 4 MG tablet With Adriamycin Cytoxan take 1 tablet day after chemo and 1 tablet 2 days after chemo with food 8 tablet 0   hydrochlorothiazide  (HYDRODIURIL ) 12.5 MG tablet Take 1 tablet (12.5 mg total) by mouth daily. 90 tablet 4   imipramine  (TOFRANIL ) 25 MG tablet Take 3 tablets (75 mg total) by mouth daily. 90 tablet 5   lidocaine -prilocaine  (EMLA ) cream Apply to affected area once 30 g 3   ondansetron  (ZOFRAN ) 8 MG tablet Take 1 tablet (8 mg total) by mouth every 8 (eight) hours as needed for nausea or vomiting. Start on the third day after chemotherapy. 30 tablet 1   prochlorperazine  (COMPAZINE ) 10 MG tablet Take 1 tablet (10 mg total) by mouth every 6 (six) hours as needed for nausea or vomiting. 30 tablet 1   rizatriptan  (MAXALT ) 10 MG tablet Take 1 tablet (10 mg total) by mouth as needed. may repeat once after 2 hours 4 tablet 5   clindamycin -benzoyl peroxide  (BENZACLIN) gel Apply topically 2 (two) times daily. (Patient not taking: Reported on 01/08/2024) 50 g 1   rosuvastatin  (CRESTOR ) 10 MG tablet Take 1 tablet (10 mg total) by mouth daily. (Patient not taking: Reported on 01/08/2024) 90 tablet 1   No current facility-administered medications for this visit.    PHYSICAL EXAMINATION: ECOG PERFORMANCE STATUS: 1 - Symptomatic but completely ambulatory  Vitals:   01/08/24 1010  BP: 121/70  Pulse: 87  Resp: 20  Temp: 98.8 F (37.1 C)   Filed Weights   01/08/24 1010  Weight: 181 lb 14.4 oz (82.5 kg)    Physical Exam Constitutional:      Appearance: Normal appearance.  Cardiovascular:     Rate and Rhythm: Normal rate and regular rhythm.     Pulses: Normal pulses.     Heart sounds: Normal heart sounds.  Chest:     Comments: Right breast mass non palpable. Port site appears well. Musculoskeletal:        General: Normal range of motion.     Cervical back: Normal range of motion and neck supple. No rigidity.   Lymphadenopathy:     Cervical: No cervical adenopathy.  Skin:    General: Skin is warm.  Neurological:     Mental Status: She is alert.      LABORATORY DATA:  I have reviewed the data as listed    Latest Ref Rng & Units 01/01/2024    7:35 AM 12/25/2023    8:03 AM 12/18/2023    9:03 AM  CMP  Glucose 70 - 99 mg/dL 877  887  878   BUN 6 - 20 mg/dL 14  13  17    Creatinine 0.44 - 1.00 mg/dL 9.35  9.33  9.15   Sodium 135 - 145 mmol/L 138  138  136   Potassium 3.5 - 5.1 mmol/L 3.6  3.4  3.9   Chloride 98 - 111 mmol/L 104  105  104   CO2 22 - 32 mmol/L 26  27  26    Calcium  8.9 - 10.3 mg/dL 8.8  8.8  8.9   Total Protein 6.5 - 8.1 g/dL 6.8  6.4  6.5   Total Bilirubin 0.0 - 1.2 mg/dL 0.4  0.3  0.3   Alkaline Phos 38 - 126 U/L 60  58  67   AST 15 - 41 U/L 17  16  17   ALT 0 - 44 U/L 25  23  25      Lab Results  Component Value Date   WBC 3.6 (L) 01/01/2024   HGB 11.4 (L) 01/01/2024   HCT 33.4 (L) 01/01/2024   MCV 84.3 01/01/2024   PLT 250 01/01/2024   NEUTROABS 1.7 01/01/2024    ASSESSMENT & PLAN:  Malignant neoplasm of upper-outer quadrant of right breast in female, estrogen receptor positive (HCC) 10/07/2023:Screening mammogram detected right breast mass at 12:00 measuring 5.1 cm, 1 axillary lymph node biopsy was benign biopsy of the mass grade 3 IDC with necrosis ER 30% weak, PR 0%, Ki67 60%, HER2 negative   Treatment plan: Neoadjuvant chemotherapy with Taxol  carbo Keytruda  followed by Adriamycin Cytoxan Keytruda  Breast conserving surgery with sentinel lymph node biopsy Adjuvant radiation therapy Antiestrogen therapy if the final path is estrogen receptor positive CT CAP and bone scan 11/04/2023: 4.7 cm right breast mass, right axillary lymph node 1.8 cm, 0.2 cm lung nodule benign Breast MRI 10/30/2023: Right breast malignancy 4.7 cm, enlarged right axillary lymph  node ------------------------------------------------------------------------------------------------------------------------------------------------ Current treatment: Cycle 9 Taxol  carbo Keytruda   Return to clinic weekly for chemotherapy ------------------------------------- Assessment and Plan Assessment & Plan ER-positive malignant neoplasm of upper-outer quadrant of right breast Breast cancer undergoing chemotherapy and immunotherapy Currently on cycle four, day one. Breast mass appears to be shrinking. Left breast pain resolved. Port site discomfort mentioned, no port site infection noted. - Continue current chemotherapy and immunotherapy regimen. - Monitor breast mass size and symptoms. - Evaluate port site for discomfort.  Chemotherapy-induced peripheral neuropathy Experiencing tingling and numbness in fingers, affecting daily activities.  She has underlying carpal tunnel but now has neuropathy in 3 fingers of both hands - Adjust chemotherapy dose to potentially reduce neuropathy. Decreased taxol  to 65 mg/m2 - Monitor neuropathy symptoms and report any changes.  Port site discomfort Discomfort at port site, especially when cold. No significant pain today. -- Monitor for any changes in discomfort.  Acne of scalp, improving Scalp acne improving.   No orders of the defined types were placed in this encounter.  The patient has a good understanding of the overall plan. she agrees with it. she will call with any problems that may develop before the next visit here. Total time spent: 30 mins including face to face time and time spent for planning, charting and co-ordination of care   Gabrielle Stalls, MD 01/08/24

## 2024-01-09 ENCOUNTER — Encounter: Payer: Self-pay | Admitting: Hematology and Oncology

## 2024-01-09 ENCOUNTER — Telehealth: Payer: Self-pay | Admitting: *Deleted

## 2024-01-09 NOTE — Telephone Encounter (Signed)
 Received call from pt with complaint of pruritus of bilateral arms and feet post tx.  Pt states symptoms typically occur post tx but will dissipate after a few hours.  Pt stating she received tx yesterday and is still experiencing pruritus.  Pt denies any redness, swelling, or rash at this time. RN reviewed with [facility] provider who has recommended pt take OTC Zyrtec or Benadryl  along with OTC Pepcid  to help provide relief.  Pt educated to contact our office if symptoms become worse, pt verbalized understanding.

## 2024-01-15 ENCOUNTER — Inpatient Hospital Stay (HOSPITAL_BASED_OUTPATIENT_CLINIC_OR_DEPARTMENT_OTHER): Admitting: Adult Health

## 2024-01-15 ENCOUNTER — Encounter: Payer: Self-pay | Admitting: Adult Health

## 2024-01-15 ENCOUNTER — Other Ambulatory Visit: Payer: Self-pay | Admitting: Hematology and Oncology

## 2024-01-15 ENCOUNTER — Inpatient Hospital Stay

## 2024-01-15 VITALS — BP 107/64 | HR 80 | Temp 97.9°F | Resp 16

## 2024-01-15 VITALS — BP 129/84 | HR 76 | Temp 98.3°F | Resp 18 | Ht 60.0 in | Wt 179.3 lb

## 2024-01-15 DIAGNOSIS — Z5112 Encounter for antineoplastic immunotherapy: Secondary | ICD-10-CM | POA: Diagnosis not present

## 2024-01-15 DIAGNOSIS — C50411 Malignant neoplasm of upper-outer quadrant of right female breast: Secondary | ICD-10-CM | POA: Diagnosis not present

## 2024-01-15 DIAGNOSIS — Z95828 Presence of other vascular implants and grafts: Secondary | ICD-10-CM

## 2024-01-15 DIAGNOSIS — Z170 Estrogen receptor positive status [ER+]: Secondary | ICD-10-CM

## 2024-01-15 LAB — CBC WITH DIFFERENTIAL (CANCER CENTER ONLY)
Abs Immature Granulocytes: 0.04 K/uL (ref 0.00–0.07)
Basophils Absolute: 0 K/uL (ref 0.0–0.1)
Basophils Relative: 1 %
Eosinophils Absolute: 0 K/uL (ref 0.0–0.5)
Eosinophils Relative: 0 %
HCT: 34.5 % — ABNORMAL LOW (ref 36.0–46.0)
Hemoglobin: 11.7 g/dL — ABNORMAL LOW (ref 12.0–15.0)
Immature Granulocytes: 1 %
Lymphocytes Relative: 42 %
Lymphs Abs: 1.7 K/uL (ref 0.7–4.0)
MCH: 28.9 pg (ref 26.0–34.0)
MCHC: 33.9 g/dL (ref 30.0–36.0)
MCV: 85.2 fL (ref 80.0–100.0)
Monocytes Absolute: 0.6 K/uL (ref 0.1–1.0)
Monocytes Relative: 14 %
Neutro Abs: 1.7 K/uL (ref 1.7–7.7)
Neutrophils Relative %: 42 %
Platelet Count: 316 K/uL (ref 150–400)
RBC: 4.05 MIL/uL (ref 3.87–5.11)
RDW: 17 % — ABNORMAL HIGH (ref 11.5–15.5)
WBC Count: 4 K/uL (ref 4.0–10.5)
nRBC: 0 % (ref 0.0–0.2)

## 2024-01-15 LAB — CMP (CANCER CENTER ONLY)
ALT: 29 U/L (ref 0–44)
AST: 20 U/L (ref 15–41)
Albumin: 4.1 g/dL (ref 3.5–5.0)
Alkaline Phosphatase: 59 U/L (ref 38–126)
Anion gap: 7 (ref 5–15)
BUN: 13 mg/dL (ref 6–20)
CO2: 28 mmol/L (ref 22–32)
Calcium: 9 mg/dL (ref 8.9–10.3)
Chloride: 103 mmol/L (ref 98–111)
Creatinine: 0.56 mg/dL (ref 0.44–1.00)
GFR, Estimated: >60 mL/min
Glucose, Bld: 100 mg/dL — ABNORMAL HIGH (ref 70–99)
Potassium: 3.5 mmol/L (ref 3.5–5.1)
Sodium: 138 mmol/L (ref 135–145)
Total Bilirubin: 0.4 mg/dL (ref 0.0–1.2)
Total Protein: 6.8 g/dL (ref 6.5–8.1)

## 2024-01-15 MED ORDER — FAMOTIDINE IN NACL 20-0.9 MG/50ML-% IV SOLN
20.0000 mg | Freq: Once | INTRAVENOUS | Status: AC
  Administered 2024-01-15: 20 mg via INTRAVENOUS
  Filled 2024-01-15: qty 50

## 2024-01-15 MED ORDER — SODIUM CHLORIDE 0.9 % IV SOLN
192.9000 mg | Freq: Once | INTRAVENOUS | Status: AC
  Administered 2024-01-15: 190 mg via INTRAVENOUS
  Filled 2024-01-15: qty 19

## 2024-01-15 MED ORDER — SODIUM CHLORIDE 0.9% FLUSH
10.0000 mL | Freq: Once | INTRAVENOUS | Status: AC
  Administered 2024-01-15: 10 mL

## 2024-01-15 MED ORDER — SODIUM CHLORIDE 0.9% FLUSH
10.0000 mL | INTRAVENOUS | Status: DC | PRN
  Administered 2024-01-15: 10 mL

## 2024-01-15 MED ORDER — DEXAMETHASONE SODIUM PHOSPHATE 10 MG/ML IJ SOLN
6.0000 mg | Freq: Once | INTRAMUSCULAR | Status: AC
  Administered 2024-01-15: 6 mg via INTRAVENOUS
  Filled 2024-01-15: qty 1

## 2024-01-15 MED ORDER — DIPHENHYDRAMINE HCL 50 MG/ML IJ SOLN
50.0000 mg | Freq: Once | INTRAMUSCULAR | Status: AC
  Administered 2024-01-15: 50 mg via INTRAVENOUS
  Filled 2024-01-15: qty 1

## 2024-01-15 MED ORDER — SODIUM CHLORIDE 0.9 % IV SOLN: INTRAVENOUS | Status: DC

## 2024-01-15 MED ORDER — SODIUM CHLORIDE 0.9 % IV SOLN
50.0000 mg/m2 | Freq: Once | INTRAVENOUS | Status: AC
  Administered 2024-01-15: 96 mg via INTRAVENOUS
  Filled 2024-01-15: qty 16

## 2024-01-15 MED ORDER — PALONOSETRON HCL INJECTION 0.25 MG/5ML
0.2500 mg | Freq: Once | INTRAVENOUS | Status: AC
  Administered 2024-01-15: 0.25 mg via INTRAVENOUS
  Filled 2024-01-15: qty 5

## 2024-01-15 NOTE — Patient Instructions (Signed)
 [facility] - A DEPT OF [facility]  Discharge Instructions: Thank you for choosing [facility] to provide your oncology and hematology care.   If you have a lab appointment with the Cancer Center, please go directly to the Cancer Center and check in at the registration area.   Wear comfortable clothing and clothing appropriate for easy access to any Portacath or PICC line.   We strive to give you quality time with your provider. You may need to reschedule your appointment if you arrive late (15 or more minutes).  Arriving late affects you and other patients whose appointments are after yours.  Also, if you miss three or more appointments without notifying the office, you may be dismissed from the clinic at the provider's discretion.      For prescription refill requests, have your pharmacy contact our office and allow 72 hours for refills to be completed.    Today you received the following chemotherapy and/or immunotherapy agents: Paclitaxel  (Taxol ) and Carboplatin    To help prevent nausea and vomiting after your treatment, we encourage you to take your nausea medication as directed.  BELOW ARE SYMPTOMS THAT SHOULD BE REPORTED IMMEDIATELY: *FEVER GREATER THAN 100.4 F (38 C) OR HIGHER *CHILLS OR SWEATING *NAUSEA AND VOMITING THAT IS NOT CONTROLLED WITH YOUR NAUSEA MEDICATION *UNUSUAL SHORTNESS OF BREATH *UNUSUAL BRUISING OR BLEEDING *URINARY PROBLEMS (pain or burning when urinating, or frequent urination) *BOWEL PROBLEMS (unusual diarrhea, constipation, pain near the anus) TENDERNESS IN MOUTH AND THROAT WITH OR WITHOUT PRESENCE OF ULCERS (sore throat, sores in mouth, or a toothache) UNUSUAL RASH, SWELLING OR PAIN  UNUSUAL VAGINAL DISCHARGE OR ITCHING   Items with * indicate a potential emergency and should be followed up as soon as possible or go to the Emergency Department if any problems should occur.  Please show the CHEMOTHERAPY ALERT  CARD or IMMUNOTHERAPY ALERT CARD at check-in to the Emergency Department and triage nurse.  Should you have questions after your visit or need to cancel or reschedule your appointment, please contact [facility] - A DEPT [facility]  Dept: [phone]  and follow the prompts.  Office hours are 8:00 a.m. to 4:30 p.m. Monday - Friday. Please note that voicemails left after 4:00 p.m. may not be returned until the following business day.  We are closed weekends and major holidays. You have access to a nurse at all times for urgent questions. Please call the main number to the clinic Dept: [phone] and follow the prompts.   For any non-urgent questions, you may also contact your provider using MyChart. We now offer e-Visits for anyone 61 and older to request care online for non-urgent symptoms. For details visit [email or web address].   Also download the MyChart app! Go to the app store, search MyChart, open the app, select [facility], and log in with your MyChart username and password.

## 2024-01-15 NOTE — Assessment & Plan Note (Signed)
 10/07/2023:Screening mammogram detected right breast mass at 12:00 measuring 5.1 cm, 1 axillary lymph node biopsy was benign biopsy of the mass grade 3 IDC with necrosis ER 30% weak, PR 0%, Ki67 60%, HER2 negative   Treatment plan: Neoadjuvant chemotherapy with Taxol  carbo Keytruda  followed by Adriamycin Cytoxan Keytruda  Breast conserving surgery with sentinel lymph node biopsy Adjuvant radiation therapy Antiestrogen therapy if the final path is estrogen receptor positive CT CAP and bone scan 11/04/2023: 4.7 cm right breast mass, right axillary lymph node 1.8 cm, 0.2 cm lung nodule benign Breast MRI 10/30/2023: Right breast malignancy 4.7 cm, enlarged right axillary lymph node ------------------------------------------------------------------------------------------------------------------------------------------------ Current treatment: Cycle 11 Taxol  carbo Keytruda   Assessment and Plan Assessment & Plan Stage IIIA invasive ductal carcinoma of breast on adjuvant chemotherapy Currently on adjuvant chemotherapy with Taxol , Carboplatin , and Keytruda , showing tumor response. Hemoglobin at 11.7, expected to decrease with Adriamycin and Cytoxan. Work accommodations discussed for immunosuppression. - Administer Taxol  and Carboplatin  with taxol  dose reduction of 50 mg/m2.   - Continue with current chemotherapy regimen. - Monitor hemoglobin levels, expect decrease with Adriamycin and Cytoxan. - Provide letter for work accommodations to allow working from home during chemotherapy regimen.  Chemotherapy-induced peripheral neuropathy Worsening peripheral neuropathy with motor deficits. Reviewed with Dr. Gudena.   - Reduce chemotherapy dose from 65 mg/m to 50 mg/m. - Monitor for further neuropathy symptoms.  Chemotherapy-induced rash and pruritus Rash and pruritus resolving with Benadryl , not a major concern. - Use Benadryl  as needed for rash and pruritus. - Monitor rash and report if it  worsens.  Immunosuppression due to chemotherapy Anticipated immunosuppression with Adriamycin and Cytoxan. Discussed infection risks and prevention strategies. - Administer injection 24-48 hours after chemotherapy to stimulate bone marrow. - Advise on potential for feeling unwell on shot day. - Provide guidance on mask use and infection prevention.  RTC in 1 week for labs, f/u, and treatment.

## 2024-01-15 NOTE — Progress Notes (Signed)
 [facility] Cancer Follow up:    [name], NP [address] [place] [address]   DIAGNOSIS:  Cancer Staging  Malignant neoplasm of upper-outer quadrant of right breast in female, estrogen receptor positive (HCC) Staging form: Breast, AJCC 8th Edition - Clinical stage from 10/22/2023: Stage IIIA (cT3, cN0, cM0, G3, ER+, PR-, HER2-) - Signed by Odean Potts, MD on 10/22/2023 Stage prefix: Initial diagnosis Histologic grading system: 3 grade system Laterality: Right Staged by: Pathologist and managing physician Stage used in treatment planning: Yes National guidelines used in treatment planning: Yes Type of national guideline used in treatment planning: NCCN    SUMMARY OF ONCOLOGIC HISTORY: Oncology History  Malignant neoplasm of upper-outer quadrant of right breast in female, estrogen receptor positive (HCC)  10/07/2023 Initial Diagnosis   Screening mammogram detected right breast mass at 12:00 measuring 5.1 cm, 1 axillary lymph node biopsy was benign biopsy of the mass grade 3 IDC with necrosis ER 30% weak, PR 0%, Ki67 60%, HER2 negative   10/22/2023 Cancer Staging   Staging form: Breast, AJCC 8th Edition - Clinical stage from 10/22/2023: Stage IIIA (cT3, cN0, cM0, G3, ER+, PR-, HER2-) - Signed by Odean Potts, MD on 10/22/2023 Stage prefix: Initial diagnosis Histologic grading system: 3 grade system Laterality: Right Staged by: Pathologist and managing physician Stage used in treatment planning: Yes National guidelines used in treatment planning: Yes Type of national guideline used in treatment planning: NCCN   11/02/2023 Genetic Testing   Negative [facility] CancerNext +RNAinsight Panel.  VUS in BARD1 at p.K438E (c.1312A>G).  Report date is 11/02/2023.   The [facility] CancerNext+RNAinsight Panel includes sequencing, rearrangement analysis, and RNA analysis for the following 40 genes: APC, ATM, BAP1, BARD1, BMPR1A, BRCA1, BRCA2, BRIP1, CDH1, CDKN2A,  CHEK2, FH, FLCN, MET, MLH1, MSH2, MSH6, MUTYH, NF1, NTHL1, PALB2, PMS2, PTEN, RAD51C, RAD51D, RSP20, SMAD4, STK11, TP53, TSC1, TSC2, and VHL (sequencing and deletion/duplication); AXIN2, HOXB13, MBD4, MSH3, POLD1 and POLE (sequencing only); EPCAM and GREM1 (deletion/duplication only).   11/06/2023 -  Chemotherapy   Patient is on Treatment Plan : BREAST Pembrolizumab  (200) D1 + Carboplatin  (1.5) D1,8,15 + Paclitaxel  (80) D1,8,15 q21d X 4 cycles / Pembrolizumab  (200) D1 + AC D1 q21d x 4 cycles       CURRENT THERAPY: Taxol /Carbo/Keytruda   INTERVAL HISTORY:  Discussed the use of AI scribe software for clinical note transcription with the patient, who gave verbal consent to proceed.  History of Present Illness Gabrielle Cross is a 58 year old female with stage IIIA invasive ductal carcinoma who presents for adjuvant chemotherapy treatment.  She is undergoing adjuvant chemotherapy with Taxol , Carboplatin , and Keytruda , followed by Adriamycin, Cytoxan, and Keytruda .  She is dose reduced at 65 mg/m2.   She is due for her eleventh treatment today, receiving Taxol  and Carboplatin  weekly and Keytruda  every three weeks.  Pruritus occurs primarily on her hands and feet, beginning the evening of chemotherapy and resolving within two days. It was most severe last week, starting around 8 PM on the day of treatment. Benadryl  alleviates the symptoms. Small red spots on her hands, attributed to dryness, have been present for about three weeks.  Numbness and tingling in her fingertips, particularly in three fingers on both her her hands, have worsened over time. This leads to difficulty with fine motor tasks, such as buttoning clothes and opening cans. Her fingers sometimes hurt, and she occasionally drops small objects due to decreased sensation. No numbness or tingling in her toes.  Patient Active Problem List   Diagnosis Date Noted   Acute folliculitis 12/09/2023   Chronic hyperglycemia 12/09/2023    Diuretic-induced hypokalemia 12/09/2023   Diabetes mellitus (HCC) 12/09/2023   Port-A-Cath in place 11/13/2023   Genetic testing 11/04/2023   Malignant neoplasm of upper-outer quadrant of right breast in female, estrogen receptor positive (HCC) 10/20/2023   Dyslipidemia, goal LDL below 130 09/17/2022   Acne rosacea 08/23/2022   Inflamed seborrheic keratosis 08/23/2022   Keratosis pilaris 08/23/2022   Vitamin D  deficiency 04/19/2021   Loud snoring 04/18/2021   Primary hypertension 04/18/2021   Encounter for general adult medical examination with abnormal findings 04/18/2021   Visit for screening mammogram 04/18/2021   Erythrocytosis 02/22/2021   Squamous cell cancer of skin of left cheek 03/24/2019   Migraines 01/09/2012    is allergic to shellfish allergy.  MEDICAL HISTORY: Past Medical History:  Diagnosis Date   Arthritis    knees   Blood transfusion without reported diagnosis 2002   Carpal tunnel syndrome, bilateral    Elevated hemoglobin (HCC)    Hypertension    Incompetent cervix    DELIVERED AT 31 WEEKS   Migraines    OSA (obstructive sleep apnea)    Rx for autopap, does not use   Ruptured ectopic pregnancy 2002   WITH BLOOD TRANSFUSION 2U'S PRBC'S   Wears glasses     SURGICAL HISTORY: Past Surgical History:  Procedure Laterality Date   BREAST BIOPSY Right 10/07/2023   US  RT BREAST BX W LOC DEV 1ST LESION IMG BX SPEC US  GUIDE 10/07/2023 [facility] MAMMOGRAPHY   CERCLAGE PLACEMENT     DILATATION & CURETTAGE/HYSTEROSCOPY WITH MYOSURE N/A 04/19/2022   Procedure: DILATATION & CURETTAGE/HYSTEROSCOPY WITH MYOSURE;  Surgeon: Lavoie, Marie-Lyne, MD;  Location: [facility];  Service: Gynecology;  Laterality: N/A;   IR IMAGING GUIDED PORT INSERTION  11/10/2023   PORTACATH PLACEMENT Left 11/05/2023   Procedure: ATTEMPTED INSERTION, TUNNELED CENTRAL VENOUS DEVICE, WITH PORT;  Surgeon: Curvin Deward MOULD, MD;  Location: [facility];  Service: General;   Laterality: Left;  PORT PLACEMENT WITH ULTRASOUND GUIDANCE   RESECTOSCOPIC POLYPECTOMY  2001   SALPINGECTOMY Right    RIGHT, RUPTURED ECTOPIC   SKIN CANCER EXCISION  2020   tangential bx  02/22/2019    SOCIAL HISTORY: Social History   Socioeconomic History   Marital status: Married    Spouse name: Not on file   Number of children: Not on file   Years of education: Not on file   Highest education level: Not on file  Occupational History   Not on file  Tobacco Use   Smoking status: Never    Passive exposure: Never   Smokeless tobacco: Never  Vaping Use   Vaping status: Never Used  Substance and Sexual Activity   Alcohol use: Yes    Comment: weekends - social    Drug use: No   Sexual activity: Yes    Partners: Male    Birth control/protection: Post-menopausal    Comment: intercoure age 12, less than 5 sexual partners, des neg  Other Topics Concern   Not on file  Social History Narrative   Not on file   Social Drivers of Health   Financial Resource Strain: Low Risk  (12/08/2023)   Overall Financial Resource Strain (CARDIA)    Difficulty of Paying Living Expenses: Not hard at all  Food Insecurity: No Food Insecurity (12/08/2023)   Hunger Vital Sign    Worried About Running Out of  Food in the Last Year: Never true    Ran Out of Food in the Last Year: Never true  Transportation Needs: No Transportation Needs (12/08/2023)   PRAPARE - Administrator, Civil Service (Medical): No    Lack of Transportation (Non-Medical): No  Physical Activity: Insufficiently Active (12/08/2023)   Exercise Vital Sign    Days of Exercise per Week: 1 day    Minutes of Exercise per Session: 30 min  Stress: No Stress Concern Present (12/08/2023)   [employer] of Occupational Health - Occupational Stress Questionnaire    Feeling of Stress: Not at all  Social Connections: Unknown (12/08/2023)   Social Connection and Isolation Panel    Frequency of Communication with Friends and Family:  Once a week    Frequency of Social Gatherings with Friends and Family: Patient declined    Attends Religious Services: Patient declined    Database administrator or Organizations: No    Attends Engineer, structural: Not on file    Marital Status: Married  Catering manager Violence: Not At Risk (10/22/2023)   Humiliation, Afraid, Rape, and Kick questionnaire    Fear of Current or Ex-Partner: No    Emotionally Abused: No    Physically Abused: No    Sexually Abused: No    FAMILY HISTORY: Family History  Problem Relation Age of Onset   Colon polyps Mother    Hyperlipidemia Father    Colon polyps Sister    Colon polyps Brother    Colon cancer Neg Hx    Esophageal cancer Neg Hx    Rectal cancer Neg Hx    Stomach cancer Neg Hx    Sleep apnea Neg Hx     Review of Systems  Constitutional:  Positive for fatigue. Negative for appetite change, chills, fever and unexpected weight change.  HENT:   Negative for hearing loss, lump/mass and trouble swallowing.   Eyes:  Negative for eye problems and icterus.  Respiratory:  Negative for chest tightness, cough and shortness of breath.   Cardiovascular:  Negative for chest pain, leg swelling and palpitations.  Gastrointestinal:  Negative for abdominal distention, abdominal pain, constipation, diarrhea, nausea and vomiting.  Endocrine: Negative for hot flashes.  Genitourinary:  Negative for difficulty urinating.   Musculoskeletal:  Negative for arthralgias.  Skin:  Negative for itching and rash.  Neurological:  Positive for numbness. Negative for dizziness, extremity weakness and headaches.  Hematological:  Negative for adenopathy. Does not bruise/bleed easily.  Psychiatric/Behavioral:  Negative for depression. The patient is not nervous/anxious.       PHYSICAL EXAMINATION    Vitals:   01/15/24 1054  BP: 129/84  Pulse: 76  Resp: 18  Temp: 98.3 F (36.8 C)  SpO2: 100%    Physical Exam Constitutional:      General: She is  not in acute distress.    Appearance: Normal appearance. She is not toxic-appearing.  HENT:     Head: Normocephalic and atraumatic.     Mouth/Throat:     Mouth: Mucous membranes are moist.     Pharynx: Oropharynx is clear. No oropharyngeal exudate or posterior oropharyngeal erythema.  Eyes:     General: No scleral icterus. Cardiovascular:     Rate and Rhythm: Normal rate and regular rhythm.     Pulses: Normal pulses.     Heart sounds: Normal heart sounds.  Pulmonary:     Effort: Pulmonary effort is normal.     Breath sounds: Normal breath sounds.  Abdominal:     General: Abdomen is flat. Bowel sounds are normal. There is no distension.     Palpations: Abdomen is soft.     Tenderness: There is no abdominal tenderness.  Musculoskeletal:        General: No swelling.     Cervical back: Neck supple.  Lymphadenopathy:     Cervical: No cervical adenopathy.  Skin:    General: Skin is warm and dry.     Findings: No rash.  Neurological:     General: No focal deficit present.     Mental Status: She is alert.  Psychiatric:        Mood and Affect: Mood normal.        Behavior: Behavior normal.     LABORATORY DATA:  CBC    Component Value Date/Time   WBC 4.0 01/15/2024 1029   WBC 5.6 09/17/2022 0830   RBC 4.05 01/15/2024 1029   HGB 11.7 (L) 01/15/2024 1029   HCT 34.5 (L) 01/15/2024 1029   PLT 316 01/15/2024 1029   MCV 85.2 01/15/2024 1029   MCH 28.9 01/15/2024 1029   MCHC 33.9 01/15/2024 1029   RDW 17.0 (H) 01/15/2024 1029   LYMPHSABS 1.7 01/15/2024 1029   MONOABS 0.6 01/15/2024 1029   EOSABS 0.0 01/15/2024 1029   BASOSABS 0.0 01/15/2024 1029    CMP     Component Value Date/Time   NA 138 01/15/2024 1029   K 3.5 01/15/2024 1029   CL 103 01/15/2024 1029   CO2 28 01/15/2024 1029   GLUCOSE 100 (H) 01/15/2024 1029   BUN 13 01/15/2024 1029   CREATININE 0.56 01/15/2024 1029   CREATININE 0.89 09/05/2020 0857   CALCIUM  9.0 01/15/2024 1029   PROT 6.8 01/15/2024 1029    ALBUMIN 4.1 01/15/2024 1029   AST 20 01/15/2024 1029   ALT 29 01/15/2024 1029   ALKPHOS 59 01/15/2024 1029   BILITOT 0.4 01/15/2024 1029   GFRNONAA >60 01/15/2024 1029     ASSESSMENT and THERAPY PLAN:   Malignant neoplasm of upper-outer quadrant of right breast in female, estrogen receptor positive (HCC) 10/07/2023:Screening mammogram detected right breast mass at 12:00 measuring 5.1 cm, 1 axillary lymph node biopsy was benign biopsy of the mass grade 3 IDC with necrosis ER 30% weak, PR 0%, Ki67 60%, HER2 negative   Treatment plan: Neoadjuvant chemotherapy with Taxol  carbo Keytruda  followed by Adriamycin Cytoxan Keytruda  Breast conserving surgery with sentinel lymph node biopsy Adjuvant radiation therapy Antiestrogen therapy if the final path is estrogen receptor positive CT CAP and bone scan 11/04/2023: 4.7 cm right breast mass, right axillary lymph node 1.8 cm, 0.2 cm lung nodule benign Breast MRI 10/30/2023: Right breast malignancy 4.7 cm, enlarged right axillary lymph node ------------------------------------------------------------------------------------------------------------------------------------------------ Current treatment: Cycle 11 Taxol  carbo Keytruda   Assessment and Plan Assessment & Plan Stage IIIA invasive ductal carcinoma of breast on adjuvant chemotherapy Currently on adjuvant chemotherapy with Taxol , Carboplatin , and Keytruda , showing tumor response. Hemoglobin at 11.7, expected to decrease with Adriamycin and Cytoxan. Work accommodations discussed for immunosuppression. - Administer Taxol  and Carboplatin  with taxol  dose reduction of 50 mg/m2.   - Continue with current chemotherapy regimen. - Monitor hemoglobin levels, expect decrease with Adriamycin and Cytoxan. - Provide letter for work accommodations to allow working from home during chemotherapy regimen.  Chemotherapy-induced peripheral neuropathy Worsening peripheral neuropathy with motor deficits. Reviewed  with Dr. Gudena.   - Reduce chemotherapy dose from 65 mg/m to 50 mg/m. - Monitor for further neuropathy symptoms.  Chemotherapy-induced rash  and pruritus Rash and pruritus resolving with Benadryl , not a major concern. - Use Benadryl  as needed for rash and pruritus. - Monitor rash and report if it worsens.  Immunosuppression due to chemotherapy Anticipated immunosuppression with Adriamycin and Cytoxan. Discussed infection risks and prevention strategies. - Administer injection 24-48 hours after chemotherapy to stimulate bone marrow. - Advise on potential for feeling unwell on shot day. - Provide guidance on mask use and infection prevention.  RTC in 1 week for labs, f/u, and treatment.     All questions were answered. The patient knows to call the clinic with any problems, questions or concerns. We can certainly see the patient much sooner if necessary.  Total encounter time:45 minutes*in face-to-face visit time, chart review, lab review, care coordination, order entry, and documentation of the encounter time.    Morna Kendall, NP 01/15/24 12:54 PM Medical Oncology and Hematology [facility] [address] [place], [place] [address] Tel. [phone]    Fax. [phone]  *Total Encounter Time as defined by the Centers for Medicare and Medicaid Services includes, in addition to the face-to-face time of a patient visit (documented in the note above) non-face-to-face time: obtaining and reviewing outside history, ordering and reviewing medications, tests or procedures, care coordination (communications with other health care professionals or caregivers) and documentation in the medical record.

## 2024-01-16 ENCOUNTER — Telehealth: Payer: Self-pay | Admitting: *Deleted

## 2024-01-16 ENCOUNTER — Other Ambulatory Visit: Payer: Self-pay

## 2024-01-16 ENCOUNTER — Inpatient Hospital Stay (HOSPITAL_BASED_OUTPATIENT_CLINIC_OR_DEPARTMENT_OTHER): Admitting: Physician Assistant

## 2024-01-16 ENCOUNTER — Other Ambulatory Visit (HOSPITAL_COMMUNITY): Payer: Self-pay

## 2024-01-16 VITALS — BP 109/62 | HR 89 | Temp 98.1°F | Resp 18 | Wt 182.2 lb

## 2024-01-16 DIAGNOSIS — Z5112 Encounter for antineoplastic immunotherapy: Secondary | ICD-10-CM | POA: Diagnosis not present

## 2024-01-16 DIAGNOSIS — C50411 Malignant neoplasm of upper-outer quadrant of right female breast: Secondary | ICD-10-CM

## 2024-01-16 DIAGNOSIS — Z170 Estrogen receptor positive status [ER+]: Secondary | ICD-10-CM

## 2024-01-16 DIAGNOSIS — L299 Pruritus, unspecified: Secondary | ICD-10-CM

## 2024-01-16 MED ORDER — METHYLPREDNISOLONE 4 MG PO TBPK
ORAL_TABLET | ORAL | 0 refills | Status: AC
  Filled 2024-01-16 (×2): qty 21, 6d supply, fill #0

## 2024-01-16 MED ORDER — HYDROXYZINE PAMOATE 25 MG PO CAPS
25.0000 mg | ORAL_CAPSULE | Freq: Three times a day (TID) | ORAL | 0 refills | Status: DC | PRN
  Filled 2024-01-16 (×2): qty 30, 10d supply, fill #0

## 2024-01-16 NOTE — Telephone Encounter (Signed)
 Received call from pt with complaint of red, warm to the touch, and itchy rash on bilateral arms and feet x 14 hours post tx yesterday.  Pt states she has been taking OTC benadryl  50 mg q 4-6 hrs with no relief. MD out of office, [facility] visit scheduled for further evaluation and tx.  Pt educated and verbalized understanding.

## 2024-01-16 NOTE — Progress Notes (Signed)
 Symptom Management Consult Note [facility]    Patient Care Team: Chrzanowski, Shasta NOVAK, NP as PCP - General (Obstetrics and Gynecology) Lavoie, Marie-Lyne, MD as Consulting Physician (Obstetrics and Gynecology) Odean Potts, MD as Consulting Physician (Hematology and Oncology) Glean Stephane BROCKS, RN (Inactive) as Oncology Nurse Navigator Tyree Nanetta SAILOR, RN as Oncology Nurse Navigator Curvin Deward MOULD, MD as Consulting Physician (General Surgery) Izell Domino, MD as Attending Physician (Radiation Oncology)    Name / MRN / DOB: Gabrielle Cross  [id number]  Jul 31, 1965   Date of visit: 01/16/2024   Chief Complaint/Reason for visit: pruritus    Current Therapy: Taxol /Carbo/Keytruda    Last treatment:  Day 8   Cycle 4 on 01/15/24    ASSESSMENT AND PLAN Patient is a 58 y.o. female with oncologic history of malignant neoplasm of upper-outer quadrant of right breast in female, estrogen receptor positive followed by Dr. Odean.  I have viewed most recent oncology note and lab work.  #Malignant neoplasm of upper-outer quadrant of right breast in female, estrogen receptor positive  - Next appointment with oncologist is 01/23/24   #Chemotherapy-induced pruritus and erythema -Intermittent pruritus and erythema affecting hands and feet post-chemotherapy. No obvious rash. Symptoms unrelieved by Benadryl . Differential includes adverse reaction to chemotherapy agents, with Taxol  and Keytruda  as potential culprits. No symptoms of anaphylaxis. - Prescribe Vistaril  for pruritus as benadryl  is no longer effective. Recommend over-the-counter Pepcid  as well. - Prescribe medrol -dosepak, start on Sunday if Vistaril  and Pepcid  insufficient. - Advise discontinuation of Benadryl . - Discuss with primary oncologist regarding steroid dosing and potential changes prior to next treatment.   Strict ED precautions discussed should symptoms worsen.   HEME/ONC HISTORY Oncology History   Malignant neoplasm of upper-outer quadrant of right breast in female, estrogen receptor positive (HCC)  10/07/2023 Initial Diagnosis   Screening mammogram detected right breast mass at 12:00 measuring 5.1 cm, 1 axillary lymph node biopsy was benign biopsy of the mass grade 3 IDC with necrosis ER 30% weak, PR 0%, Ki67 60%, HER2 negative   10/22/2023 Cancer Staging   Staging form: Breast, AJCC 8th Edition - Clinical stage from 10/22/2023: Stage IIIA (cT3, cN0, cM0, G3, ER+, PR-, HER2-) - Signed by Odean Potts, MD on 10/22/2023 Stage prefix: Initial diagnosis Histologic grading system: 3 grade system Laterality: Right Staged by: Pathologist and managing physician Stage used in treatment planning: Yes National guidelines used in treatment planning: Yes Type of national guideline used in treatment planning: NCCN   11/02/2023 Genetic Testing   Negative [facility] CancerNext +RNAinsight Panel.  VUS in BARD1 at p.K438E (c.1312A>G).  Report date is 11/02/2023.   The [facility] CancerNext+RNAinsight Panel includes sequencing, rearrangement analysis, and RNA analysis for the following 40 genes: APC, ATM, BAP1, BARD1, BMPR1A, BRCA1, BRCA2, BRIP1, CDH1, CDKN2A, CHEK2, FH, FLCN, MET, MLH1, MSH2, MSH6, MUTYH, NF1, NTHL1, PALB2, PMS2, PTEN, RAD51C, RAD51D, RSP20, SMAD4, STK11, TP53, TSC1, TSC2, and VHL (sequencing and deletion/duplication); AXIN2, HOXB13, MBD4, MSH3, POLD1 and POLE (sequencing only); EPCAM and GREM1 (deletion/duplication only).   11/06/2023 -  Chemotherapy   Patient is on Treatment Plan : BREAST Pembrolizumab  (200) D1 + Carboplatin  (1.5) D1,8,15 + Paclitaxel  (80) D1,8,15 q21d X 4 cycles / Pembrolizumab  (200) D1 + AC D1 q21d x 4 cycles         INTERVAL HISTORY  Discussed the use of AI scribe software for clinical note transcription with the patient, who gave verbal consent to proceed.    Gabrielle Cross is a 58 y.o.  female with oncologic history as above presenting to [facility] today with chief complaint of  pruritus. Patient is accompanied by family member who provides additional history.  Chart review shows patient saw provider and had treatment yesterday. Per note rash and pruritus was not a major concern as it was resolving with benadryl .   Patient had treatment yesterday, receiving Carbo and Taxol . Severe itching began last night at 7 PM, primarily affecting her hands and feet, and has persisted despite taking Benadryl  every four hours.  She also notes itching on her back and feet, with no visible rash on her feet.  The itching episodes began after treatment on 01/01/24 with taxol  and [place]. After her last two treatments the itching was brief, lasting about 30 minutes after treatment, and resolved without intervention. However, last week, the itching started at 8 PM and persisted throughout the night, prompting her to take Benadryl , which provided some relief. Last night's episode was more severe, with no relief from Benadryl .  No new exposures to potential allergens such as new clothes, detergents, or lotions. No difficulty breathing. She has a history of neuropathy in her hands which feels unchanged today.     ROS  All other systems are reviewed and are negative for acute change except as noted in the HPI.    Allergies  Allergen Reactions   Shellfish Allergy Diarrhea     Past Medical History:  Diagnosis Date   Arthritis    knees   Blood transfusion without reported diagnosis 2002   Carpal tunnel syndrome, bilateral    Elevated hemoglobin (HCC)    Hypertension    Incompetent cervix    DELIVERED AT 31 WEEKS   Migraines    OSA (obstructive sleep apnea)    Rx for autopap, does not use   Ruptured ectopic pregnancy 2002   WITH BLOOD TRANSFUSION 2U'S PRBC'S   Wears glasses      Past Surgical History:  Procedure Laterality Date   BREAST BIOPSY Right 10/07/2023   US  RT BREAST BX W LOC DEV 1ST LESION IMG BX SPEC US  GUIDE 10/07/2023 [facility] MAMMOGRAPHY   CERCLAGE PLACEMENT      DILATATION & CURETTAGE/HYSTEROSCOPY WITH MYOSURE N/A 04/19/2022   Procedure: DILATATION & CURETTAGE/HYSTEROSCOPY WITH MYOSURE;  Surgeon: Lavoie, Marie-Lyne, MD;  Location: [facility];  Service: Gynecology;  Laterality: N/A;   IR IMAGING GUIDED PORT INSERTION  11/10/2023   PORTACATH PLACEMENT Left 11/05/2023   Procedure: ATTEMPTED INSERTION, TUNNELED CENTRAL VENOUS DEVICE, WITH PORT;  Surgeon: Curvin Deward MOULD, MD;  Location: [facility];  Service: General;  Laterality: Left;  PORT PLACEMENT WITH ULTRASOUND GUIDANCE   RESECTOSCOPIC POLYPECTOMY  2001   SALPINGECTOMY Right    RIGHT, RUPTURED ECTOPIC   SKIN CANCER EXCISION  2020   tangential bx  02/22/2019    Social History   Socioeconomic History   Marital status: Married    Spouse name: Not on file   Number of children: Not on file   Years of education: Not on file   Highest education level: Not on file  Occupational History   Not on file  Tobacco Use   Smoking status: Never    Passive exposure: Never   Smokeless tobacco: Never  Vaping Use   Vaping status: Never Used  Substance and Sexual Activity   Alcohol use: Yes    Comment: weekends - social    Drug use: No   Sexual activity: Yes    Partners: Male    Birth control/protection:  Post-menopausal    Comment: intercoure age 38, less than 5 sexual partners, des neg  Other Topics Concern   Not on file  Social History Narrative   Not on file   Social Drivers of Health   Financial Resource Strain: Low Risk  (12/08/2023)   Overall Financial Resource Strain (CARDIA)    Difficulty of Paying Living Expenses: Not hard at all  Food Insecurity: No Food Insecurity (12/08/2023)   Hunger Vital Sign    Worried About Running Out of Food in the Last Year: Never true    Ran Out of Food in the Last Year: Never true  Transportation Needs: No Transportation Needs (12/08/2023)   PRAPARE - Administrator, Civil Service (Medical): No    Lack of Transportation  (Non-Medical): No  Physical Activity: Insufficiently Active (12/08/2023)   Exercise Vital Sign    Days of Exercise per Week: 1 day    Minutes of Exercise per Session: 30 min  Stress: No Stress Concern Present (12/08/2023)   [employer] of Occupational Health - Occupational Stress Questionnaire    Feeling of Stress: Not at all  Social Connections: Unknown (12/08/2023)   Social Connection and Isolation Panel    Frequency of Communication with Friends and Family: Once a week    Frequency of Social Gatherings with Friends and Family: Patient declined    Attends Religious Services: Patient declined    Database administrator or Organizations: No    Attends Engineer, structural: Not on file    Marital Status: Married  Catering manager Violence: Not At Risk (10/22/2023)   Humiliation, Afraid, Rape, and Kick questionnaire    Fear of Current or Ex-Partner: No    Emotionally Abused: No    Physically Abused: No    Sexually Abused: No    Family History  Problem Relation Age of Onset   Colon polyps Mother    Hyperlipidemia Father    Colon polyps Sister    Colon polyps Brother    Colon cancer Neg Hx    Esophageal cancer Neg Hx    Rectal cancer Neg Hx    Stomach cancer Neg Hx    Sleep apnea Neg Hx      Current Outpatient Medications:    hydrOXYzine  (VISTARIL ) 25 MG capsule, Take 1 capsule (25 mg total) by mouth 3 (three) times daily as needed for itching., Disp: 30 capsule, Rfl: 0   methylPREDNISolone  (MEDROL  DOSEPAK) 4 MG TBPK tablet, Take 6 tablets by mouth on day 1 then decrease by 1 tablet daily until finished, Disp: 21 tablet, Rfl: 0   clindamycin -benzoyl peroxide  (BENZACLIN) gel, Apply topically 2 (two) times daily. (Patient not taking: Reported on 01/08/2024), Disp: 50 g, Rfl: 1   dexamethasone  (DECADRON ) 4 MG tablet, With Adriamycin Cytoxan take 1 tablet day after chemo and 1 tablet 2 days after chemo with food, Disp: 8 tablet, Rfl: 0   hydrochlorothiazide  (HYDRODIURIL ) 12.5  MG tablet, Take 1 tablet (12.5 mg total) by mouth daily., Disp: 90 tablet, Rfl: 4   imipramine  (TOFRANIL ) 25 MG tablet, Take 3 tablets (75 mg total) by mouth daily., Disp: 90 tablet, Rfl: 5   lidocaine -prilocaine  (EMLA ) cream, Apply to affected area once, Disp: 30 g, Rfl: 3   ondansetron  (ZOFRAN ) 8 MG tablet, Take 1 tablet (8 mg total) by mouth every 8 (eight) hours as needed for nausea or vomiting. Start on the third day after chemotherapy., Disp: 30 tablet, Rfl: 1   prochlorperazine  (COMPAZINE ) 10 MG tablet,  Take 1 tablet (10 mg total) by mouth every 6 (six) hours as needed for nausea or vomiting., Disp: 30 tablet, Rfl: 1   rizatriptan  (MAXALT ) 10 MG tablet, Take 1 tablet (10 mg total) by mouth as needed. may repeat once after 2 hours, Disp: 4 tablet, Rfl: 5   rosuvastatin  (CRESTOR ) 10 MG tablet, Take 1 tablet (10 mg total) by mouth daily. (Patient not taking: Reported on 01/08/2024), Disp: 90 tablet, Rfl: 1  PHYSICAL EXAM ECOG FS:1 - Symptomatic but completely ambulatory    Vitals:   01/16/24 1440  BP: 109/62  Pulse: 89  Resp: 18  Temp: 98.1 F (36.7 C)  TempSrc: Oral  SpO2: 100%  Weight: 182 lb 3 oz (82.6 kg)   Physical Exam Vitals and nursing note reviewed.  Constitutional:      Appearance: She is not ill-appearing or toxic-appearing.  HENT:     Head: Normocephalic.  Eyes:     Conjunctiva/sclera: Conjunctivae normal.  Cardiovascular:     Rate and Rhythm: Normal rate.  Pulmonary:     Effort: Pulmonary effort is normal.  Abdominal:     General: There is no distension.  Musculoskeletal:     Cervical back: Normal range of motion.  Skin:    General: Skin is warm and dry.     Findings: Erythema (on bilateral forearms and soles. No rash) present.  Neurological:     Mental Status: She is alert.        LABORATORY DATA I have reviewed the data as listed    Latest Ref Rng & Units 01/15/2024   10:29 AM 01/08/2024    9:53 AM 01/01/2024    7:35 AM  CBC  WBC 4.0 - 10.5 K/uL  4.0  3.1  3.6   Hemoglobin 12.0 - 15.0 g/dL 88.2  88.7  88.5   Hematocrit 36.0 - 46.0 % 34.5  32.9  33.4   Platelets 150 - 400 K/uL 316  272  250         Latest Ref Rng & Units 01/15/2024   10:29 AM 01/08/2024    9:53 AM 01/01/2024    7:35 AM  CMP  Glucose 70 - 99 mg/dL 899  899  877   BUN 6 - 20 mg/dL 13  14  14    Creatinine 0.44 - 1.00 mg/dL 9.43  9.41  9.35   Sodium 135 - 145 mmol/L 138  136  138   Potassium 3.5 - 5.1 mmol/L 3.5  3.7  3.6   Chloride 98 - 111 mmol/L 103  101  104   CO2 22 - 32 mmol/L 28  29  26    Calcium  8.9 - 10.3 mg/dL 9.0  8.9  8.8   Total Protein 6.5 - 8.1 g/dL 6.8  6.7  6.8   Total Bilirubin 0.0 - 1.2 mg/dL 0.4  0.4  0.4   Alkaline Phos 38 - 126 U/L 59  58  60   AST 15 - 41 U/L 20  17  17    ALT 0 - 44 U/L 29  25  25         RADIOGRAPHIC STUDIES (from last 24 hours if applicable) I have personally reviewed the radiological images as listed and agreed with the findings in the report. No results found.      Visit Diagnosis: 1. Malignant neoplasm of upper-outer quadrant of right breast in female, estrogen receptor positive (HCC)   2. Pruritus      No orders of the defined types were  placed in this encounter.   All questions were answered. The patient knows to call the clinic with any problems, questions or concerns. No barriers to learning was detected.  A total of more than 30 minutes were spent on this encounter with face-to-face time and non-face-to-face time, including preparing to see the patient, ordering tests and/or medications, counseling the patient and coordination of care as outlined above.    Thank you for allowing me to participate in the care of this patient.    Trek Kimball E  Walisiewicz, PA-C Department of Hematology/Oncology [facility] at [facility] Phone: [phone]  [phone]    01/16/2024 3:49 PM

## 2024-01-21 ENCOUNTER — Encounter: Payer: Self-pay | Admitting: Hematology and Oncology

## 2024-01-22 ENCOUNTER — Encounter: Payer: Self-pay | Admitting: Radiology

## 2024-01-22 ENCOUNTER — Other Ambulatory Visit (HOSPITAL_COMMUNITY): Payer: Self-pay

## 2024-01-22 ENCOUNTER — Other Ambulatory Visit: Payer: Self-pay

## 2024-01-22 ENCOUNTER — Ambulatory Visit (INDEPENDENT_AMBULATORY_CARE_PROVIDER_SITE_OTHER): Admitting: Radiology

## 2024-01-22 ENCOUNTER — Encounter: Payer: Self-pay | Admitting: Pharmacist

## 2024-01-22 DIAGNOSIS — L739 Follicular disorder, unspecified: Secondary | ICD-10-CM

## 2024-01-22 MED ORDER — SULFAMETHOXAZOLE-TRIMETHOPRIM 800-160 MG PO TABS
1.0000 | ORAL_TABLET | Freq: Two times a day (BID) | ORAL | 0 refills | Status: DC
  Filled 2024-01-22 (×2): qty 20, 10d supply, fill #0

## 2024-01-22 NOTE — Progress Notes (Signed)
      Subjective: Gabrielle Cross is a 58 y.o. female who complains of folliculitis on mons. Hair is starting to grow back after losing it from chemo. Area is tender and sore, had some white pus on her underwear yesterday and today. Small pustules on the back of her head as well.    Review of Systems  All other systems reviewed and are negative.   Past Medical History:  Diagnosis Date   Arthritis    knees   Blood transfusion without reported diagnosis 2002   Carpal tunnel syndrome, bilateral    Elevated hemoglobin (HCC)    Hypertension    Incompetent cervix    DELIVERED AT 31 WEEKS   Migraines    OSA (obstructive sleep apnea)    Rx for autopap, does not use   Ruptured ectopic pregnancy 2002   WITH BLOOD TRANSFUSION 2U'S PRBC'S   Wears glasses       Objective:  There were no vitals filed for this visit. There is no height or weight on file to calculate BMI.   Physical Exam Genitourinary:     Comments: 2cm firm cystic area on the upper right mons, scant white exudate with pressure Skin:        Comments: Multiple small white pustules on the back of her head       Assessment:/Plan:  1. Acute folliculitis (Primary) - sulfamethoxazole -trimethoprim  (BACTRIM  DS) 800-160 MG tablet; Take 1 tablet by mouth 2 (two) times daily.  Dispense: 20 tablet; Refill: 0   Tava Peery B, NP 8:54 AM

## 2024-01-23 ENCOUNTER — Inpatient Hospital Stay: Admitting: *Deleted

## 2024-01-23 ENCOUNTER — Inpatient Hospital Stay (HOSPITAL_BASED_OUTPATIENT_CLINIC_OR_DEPARTMENT_OTHER): Admitting: Adult Health

## 2024-01-23 ENCOUNTER — Inpatient Hospital Stay

## 2024-01-23 ENCOUNTER — Encounter: Payer: Self-pay | Admitting: Adult Health

## 2024-01-23 ENCOUNTER — Other Ambulatory Visit: Payer: Self-pay | Admitting: *Deleted

## 2024-01-23 VITALS — BP 119/57 | HR 89 | Temp 97.5°F | Resp 16

## 2024-01-23 VITALS — BP 113/66 | HR 87 | Temp 98.7°F | Resp 17 | Wt 179.4 lb

## 2024-01-23 DIAGNOSIS — C50411 Malignant neoplasm of upper-outer quadrant of right female breast: Secondary | ICD-10-CM

## 2024-01-23 DIAGNOSIS — Z170 Estrogen receptor positive status [ER+]: Secondary | ICD-10-CM

## 2024-01-23 DIAGNOSIS — Z95828 Presence of other vascular implants and grafts: Secondary | ICD-10-CM

## 2024-01-23 DIAGNOSIS — Z5112 Encounter for antineoplastic immunotherapy: Secondary | ICD-10-CM | POA: Diagnosis not present

## 2024-01-23 LAB — CMP (CANCER CENTER ONLY)
ALT: 25 U/L (ref 0–44)
AST: 16 U/L (ref 15–41)
Albumin: 3.9 g/dL (ref 3.5–5.0)
Alkaline Phosphatase: 56 U/L (ref 38–126)
Anion gap: 6 (ref 5–15)
BUN: 18 mg/dL (ref 6–20)
CO2: 26 mmol/L (ref 22–32)
Calcium: 8.9 mg/dL (ref 8.9–10.3)
Chloride: 105 mmol/L (ref 98–111)
Creatinine: 0.61 mg/dL (ref 0.44–1.00)
GFR, Estimated: >60 mL/min (ref >60)
Glucose, Bld: 115 mg/dL — ABNORMAL HIGH (ref 70–99)
Potassium: 3.3 mmol/L — ABNORMAL LOW (ref 3.5–5.1)
Sodium: 137 mmol/L (ref 135–145)
Total Bilirubin: 0.4 mg/dL (ref 0.0–1.2)
Total Protein: 6.3 g/dL — ABNORMAL LOW (ref 6.5–8.1)

## 2024-01-23 LAB — CBC WITH DIFFERENTIAL (CANCER CENTER ONLY)
Abs Immature Granulocytes: 0.03 K/uL (ref 0.00–0.07)
Basophils Absolute: 0 K/uL (ref 0.0–0.1)
Basophils Relative: 0 %
Eosinophils Absolute: 0 K/uL (ref 0.0–0.5)
Eosinophils Relative: 0 %
HCT: 33.8 % — ABNORMAL LOW (ref 36.0–46.0)
Hemoglobin: 11.4 g/dL — ABNORMAL LOW (ref 12.0–15.0)
Immature Granulocytes: 1 %
Lymphocytes Relative: 27 %
Lymphs Abs: 1.3 K/uL (ref 0.7–4.0)
MCH: 28.9 pg (ref 26.0–34.0)
MCHC: 33.7 g/dL (ref 30.0–36.0)
MCV: 85.8 fL (ref 80.0–100.0)
Monocytes Absolute: 0.7 K/uL (ref 0.1–1.0)
Monocytes Relative: 15 %
Neutro Abs: 2.6 K/uL (ref 1.7–7.7)
Neutrophils Relative %: 57 %
Platelet Count: 304 K/uL (ref 150–400)
RBC: 3.94 MIL/uL (ref 3.87–5.11)
RDW: 17.4 % — ABNORMAL HIGH (ref 11.5–15.5)
WBC Count: 4.6 K/uL (ref 4.0–10.5)
nRBC: 0 % (ref 0.0–0.2)

## 2024-01-23 LAB — RESEARCH LABS

## 2024-01-23 MED ORDER — SODIUM CHLORIDE 0.9 % IV SOLN
50.0000 mg/m2 | Freq: Once | INTRAVENOUS | Status: AC
  Administered 2024-01-23: 96 mg via INTRAVENOUS
  Filled 2024-01-23: qty 16

## 2024-01-23 MED ORDER — SODIUM CHLORIDE 0.9 % IV SOLN: INTRAVENOUS | Status: DC

## 2024-01-23 MED ORDER — SODIUM CHLORIDE 0.9% FLUSH
10.0000 mL | Freq: Once | INTRAVENOUS | Status: AC
  Administered 2024-01-23: 10 mL

## 2024-01-23 MED ORDER — DEXAMETHASONE SODIUM PHOSPHATE 10 MG/ML IJ SOLN
6.0000 mg | Freq: Once | INTRAMUSCULAR | Status: AC
  Administered 2024-01-23: 6 mg via INTRAVENOUS
  Filled 2024-01-23: qty 1

## 2024-01-23 MED ORDER — SODIUM CHLORIDE 0.9 % IV SOLN
192.9000 mg | Freq: Once | INTRAVENOUS | Status: AC
  Administered 2024-01-23: 190 mg via INTRAVENOUS
  Filled 2024-01-23: qty 19

## 2024-01-23 MED ORDER — DIPHENHYDRAMINE HCL 50 MG/ML IJ SOLN
50.0000 mg | Freq: Once | INTRAMUSCULAR | Status: AC
  Administered 2024-01-23: 50 mg via INTRAVENOUS
  Filled 2024-01-23: qty 1

## 2024-01-23 MED ORDER — PALONOSETRON HCL INJECTION 0.25 MG/5ML
0.2500 mg | Freq: Once | INTRAVENOUS | Status: AC
  Administered 2024-01-23: 0.25 mg via INTRAVENOUS
  Filled 2024-01-23: qty 5

## 2024-01-23 MED ORDER — FAMOTIDINE IN NACL 20-0.9 MG/50ML-% IV SOLN
20.0000 mg | Freq: Once | INTRAVENOUS | Status: AC
  Administered 2024-01-23: 20 mg via INTRAVENOUS
  Filled 2024-01-23: qty 50

## 2024-01-23 MED ORDER — SODIUM CHLORIDE 0.9% FLUSH: 10.0000 mL | INTRAVENOUS | Status: DC | PRN

## 2024-01-23 NOTE — Research (Signed)
 [name], ICE COMPRESS: RANDOMIZED TRIAL OF LIMB CRYOCOMPRESSION VERSUS CONTINUOUS COMPRESSION VERSUS LOW CYCLIC COMPRESSION FOR THE PREVENTION OF TAXANE-INDUCED PERIPHERAL NEUROPATHY   Week 12 Assessments:  Patient arrives today Accompanied by her daughterfor the Week 12 Assessments. Confirmed patient does not have wounds, sores, or lesions to extremities. Patient has not had any vaccinations since last visit.   PROs: Per study protocol, all PROs required for this visit were completed prior to other study activities.This clinical research coordinator reviewed both forms for completeness and accuracy.   Labs: Optional labs are collected per consent and study protocol by [facility] a [employer]. [name] tolerated well without complaint.  Neuropathy Assessment: All neuro assessments (Neuropen, [place], and Timed Get Up and Go) were completed by Cherylyn Hoard, RN. This CRC acted as a Neurosurgeon. Patient tolerated all testing without complaint.   Supplemental Agents: Patient denies taking Duloxetine for any reason. Pt denies any treatments for prescribed for symptom management of peripheral neuropathy since the start of the study. Pt denies taking vitamins and/or supplements for symptom management of peripheral neuropathy. Pt denies receiving any complimentary alternative therapies for symptom management of peripheral neuropathy.  MD/Provider Visit: Patient saw Morna Kendall, NP for today's visit.    Adverse Events: Solicited AEs reviewed with pt. She did not have any device related AEs. Attribution per Morna Kendall, NP. Pt is no longer using the device. No AEs are related to the study. See AE table below.  Adverse Event CTCAE Grade Onset date Resolved date Relationship to Study Intervention Relationship to Taxane Action Taken Comments  Skin atrophy (solicited) 0   na     Skin hyperpigmentation (solicited) 0   na     Skin hypopigmentation (solicited) 0   na     Skin induration (solicited) 0   na      Skin ulceration (solicited) 0   na     Rash maculopapular (solicited) 0   na     Nail changes (solicited) 0   na     Cold intolerance (solicited)  (general disorders and administration site conditions- other) 0   na     Frostbite (solicited)  (skin and subcutaneous tissue disorders- other) 0   na     Peripheral sensory neuropathy 1 12/11/23 ongoing  unrelated Definite  Related to taxane  per MD note  Peripheral sensory neuropathy 2 12/23/23 ongoing unrelated Definite     No device management for this visit.    Disposition: Upon completion off all study requirements, patient remained in the exam room to await her next appt.   Plan: The patient was thanked for her time and continued voluntary participation in this study. The next study assessment is due in 12 weeks and will be coordinated to occur during a regularly scheduled clinic visit. Patient agreed with this plan. [name] has been provided direct contact information and is encouraged to contact this Coordinator for any needs or questions.  Amarie Viles, Ph.D. Clinical Research Coordinator [phone] 01/23/2024 2:42 PM

## 2024-01-23 NOTE — Progress Notes (Unsigned)
  Cancer Center Cancer Follow up:    Gabrielle Shasta NOVAK, NP [address] [place] [address]   DIAGNOSIS: Cancer Staging  Malignant neoplasm of upper-outer quadrant of right breast in female, estrogen receptor positive (HCC) Staging form: Breast, AJCC 8th Edition - Clinical stage from 10/22/2023: Stage IIIA (cT3, cN0, cM0, G3, ER+, PR-, HER2-) - Signed by Odean Potts, MD on 10/22/2023 Stage prefix: Initial diagnosis Histologic grading system: 3 grade system Laterality: Right Staged by: Pathologist and managing physician Stage used in treatment planning: Yes National guidelines used in treatment planning: Yes Type of national guideline used in treatment planning: NCCN    SUMMARY OF ONCOLOGIC HISTORY: Oncology History  Malignant neoplasm of upper-outer quadrant of right breast in female, estrogen receptor positive (HCC)  10/07/2023 Initial Diagnosis   Screening mammogram detected right breast mass at 12:00 measuring 5.1 cm, 1 axillary lymph node biopsy was benign biopsy of the mass grade 3 IDC with necrosis ER 30% weak, PR 0%, Ki67 60%, HER2 negative   10/22/2023 Cancer Staging   Staging form: Breast, AJCC 8th Edition - Clinical stage from 10/22/2023: Stage IIIA (cT3, cN0, cM0, G3, ER+, PR-, HER2-) - Signed by Odean Potts, MD on 10/22/2023 Stage prefix: Initial diagnosis Histologic grading system: 3 grade system Laterality: Right Staged by: Pathologist and managing physician Stage used in treatment planning: Yes National guidelines used in treatment planning: Yes Type of national guideline used in treatment planning: NCCN   11/02/2023 Genetic Testing   Negative [facility] Cancer[facility] +RNAinsight Panel.  VUS in BARD1 at p.K438E (c.1312A>G).  Report date is 11/02/2023.   The [facility] CancerNext+RNAinsight Panel includes sequencing, rearrangement analysis, and RNA analysis for the following 40 genes: APC, ATM, BAP1, BARD1, BMPR1A, BRCA1, BRCA2, BRIP1, CDH1, CDKN2A,  CHEK2, FH, FLCN, MET, MLH1, MSH2, MSH6, MUTYH, NF1, NTHL1, PALB2, PMS2, PTEN, RAD51C, RAD51D, RSP20, SMAD4, STK11, TP53, TSC1, TSC2, and VHL (sequencing and deletion/duplication); AXIN2, HOXB13, MBD4, MSH3, POLD1 and POLE (sequencing only); EPCAM and GREM1 (deletion/duplication only).   11/06/2023 -  Chemotherapy   Patient is on Treatment Plan : BREAST Pembrolizumab  (200) D1 + Carboplatin  (1.5) D1,8,15 + Paclitaxel  (80) D1,8,15 q21d X 4 cycles / Pembrolizumab  (200) D1 + AC D1 q21d x 4 cycles       CURRENT THERAPY: Taxol /Carbo/Keytruda   INTERVAL HISTORY:  Discussed the use of AI scribe software for clinical note transcription with the patient, who gave verbal consent to proceed.  History of Present Illness Gabrielle Cross is a 58 year old female with stage IIIA invasive ductal carcinoma who presents for follow-up and evaluation prior to receiving chemotherapy.  She is undergoing chemotherapy with Taxol  and Carbo weekly, and Keytruda  every three weeks. Her last Keytruda  dose was on August 7th. She experiences persistent neuropathy in the first two fingers of her hand, affecting her ability to manipulate small objects.  Her Taxol  was dose reduced and she notes that her neuropathy has remained stable and not worsened since being evaluated by Dr. Gudena.    She has an occasional cough for about two weeks, occurring a couple of times a day with phlegm. There are no fevers, chills, or shortness of breath.  She is taking Bactrim  for folliculitis, with two particularly bad lesions not on her head. Her potassium levels are slightly low, and she manages this by consuming potassium-rich foods like bananas.     Patient Active Problem List   Diagnosis Date Noted  . Acute folliculitis 12/09/2023  . Chronic hyperglycemia 12/09/2023  .  Diuretic-induced hypokalemia 12/09/2023  . Diabetes mellitus (HCC) 12/09/2023  . Port-A-Cath in place 11/13/2023  . Genetic testing 11/04/2023  . Malignant neoplasm of  upper-outer quadrant of right breast in female, estrogen receptor positive (HCC) 10/20/2023  . Dyslipidemia, goal LDL below 130 09/17/2022  . Acne rosacea 08/23/2022  . Inflamed seborrheic keratosis 08/23/2022  . Keratosis pilaris 08/23/2022  . Vitamin D  deficiency 04/19/2021  . Loud snoring 04/18/2021  . Primary hypertension 04/18/2021  . Encounter for general adult medical examination with abnormal findings 04/18/2021  . Visit for screening mammogram 04/18/2021  . Erythrocytosis 02/22/2021  . Squamous cell cancer of skin of left cheek 03/24/2019  . Migraines 01/09/2012    is allergic to shellfish allergy.  MEDICAL HISTORY: Past Medical History:  Diagnosis Date  . Arthritis    knees  . Blood transfusion without reported diagnosis 2002  . Carpal tunnel syndrome, bilateral   . Elevated hemoglobin (HCC)   . Hypertension   . Incompetent cervix    DELIVERED AT 31 WEEKS  . Migraines   . OSA (obstructive sleep apnea)    Rx for autopap, does not use  . Ruptured ectopic pregnancy 2002   WITH BLOOD TRANSFUSION 2U'S PRBC'S  . Wears glasses     SURGICAL HISTORY: Past Surgical History:  Procedure Laterality Date  . BREAST BIOPSY Right 10/07/2023   US  RT BREAST BX W LOC DEV 1ST LESION IMG BX SPEC US  GUIDE 10/07/2023 [facility] MAMMOGRAPHY  . CERCLAGE PLACEMENT    . DILATATION & CURETTAGE/HYSTEROSCOPY WITH MYOSURE N/A 04/19/2022   Procedure: DILATATION & CURETTAGE/HYSTEROSCOPY WITH MYOSURE;  Surgeon: Lavoie, Marie-Lyne, MD;  Location: [facility];  Service: Gynecology;  Laterality: N/A;  . IR IMAGING GUIDED PORT INSERTION  11/10/2023  . PORTACATH PLACEMENT Left 11/05/2023   Procedure: ATTEMPTED INSERTION, TUNNELED CENTRAL VENOUS DEVICE, WITH PORT;  Surgeon: Curvin Deward MOULD, MD;  Location: [facility];  Service: General;  Laterality: Left;  PORT PLACEMENT WITH ULTRASOUND GUIDANCE  . RESECTOSCOPIC POLYPECTOMY  2001  . SALPINGECTOMY Right    RIGHT, RUPTURED ECTOPIC  .  SKIN CANCER EXCISION  2020  . tangential bx  02/22/2019    SOCIAL HISTORY: Social History   Socioeconomic History  . Marital status: Married    Spouse name: Not on file  . Number of children: Not on file  . Years of education: Not on file  . Highest education level: Not on file  Occupational History  . Not on file  Tobacco Use  . Smoking status: Never    Passive exposure: Never  . Smokeless tobacco: Never  Vaping Use  . Vaping status: Never Used  Substance and Sexual Activity  . Alcohol use: Yes    Comment: weekends - social   . Drug use: No  . Sexual activity: Yes    Partners: Male    Birth control/protection: Post-menopausal    Comment: intercoure age 82, less than 5 sexual partners, des neg  Other Topics Concern  . Not on file  Social History Narrative  . Not on file   Social Drivers of Health   Financial Resource Strain: Low Risk  (12/08/2023)   Overall Financial Resource Strain (CARDIA)   . Difficulty of Paying Living Expenses: Not hard at all  Food Insecurity: No Food Insecurity (12/08/2023)   Hunger Vital Sign   . Worried About Programme researcher, broadcasting/film/video in the Last Year: Never true   . Ran Out of Food in the Last Year: Never true  Transportation Needs: No Transportation Needs (12/08/2023)   PRAPARE - Transportation   . Lack of Transportation (Medical): No   . Lack of Transportation (Non-Medical): No  Physical Activity: Insufficiently Active (12/08/2023)   Exercise Vital Sign   . Days of Exercise per Week: 1 day   . Minutes of Exercise per Session: 30 min  Stress: No Stress Concern Present (12/08/2023)   [employer] of Occupational Health - Occupational Stress Questionnaire   . Feeling of Stress: Not at all  Social Connections: Unknown (12/08/2023)   Social Connection and Isolation Panel   . Frequency of Communication with Friends and Family: Once a week   . Frequency of Social Gatherings with Friends and Family: Patient declined   . Attends Religious Services:  Patient declined   . Active Member of Clubs or Organizations: No   . Attends Banker Meetings: Not on file   . Marital Status: Married  Catering manager Violence: Not At Risk (10/22/2023)   Humiliation, Afraid, Rape, and Kick questionnaire   . Fear of Current or Ex-Partner: No   . Emotionally Abused: No   . Physically Abused: No   . Sexually Abused: No    FAMILY HISTORY: Family History  Problem Relation Age of Onset  . Colon polyps Mother   . Hyperlipidemia Father   . Colon polyps Sister   . Colon polyps Brother   . Colon cancer Neg Hx   . Esophageal cancer Neg Hx   . Rectal cancer Neg Hx   . Stomach cancer Neg Hx   . Sleep apnea Neg Hx     Review of Systems - Oncology    PHYSICAL EXAMINATION   Onc Performance Status - 01/23/24 1056       ECOG Perf Status   ECOG Perf Status Fully active, able to carry on all pre-disease performance without restriction      KPS SCALE   KPS % SCORE Normal, no compliants, no evidence of disease          Vitals:   01/23/24 1054  BP: 113/66  Pulse: 87  Resp: 17  Temp: 98.7 F (37.1 C)  SpO2: 99%    Physical Exam  LABORATORY DATA:  CBC    Component Value Date/Time   WBC 4.6 01/23/2024 1017   WBC 5.6 09/17/2022 0830   RBC 3.94 01/23/2024 1017   HGB 11.4 (L) 01/23/2024 1017   HCT 33.8 (L) 01/23/2024 1017   PLT 304 01/23/2024 1017   MCV 85.8 01/23/2024 1017   MCH 28.9 01/23/2024 1017   MCHC 33.7 01/23/2024 1017   RDW 17.4 (H) 01/23/2024 1017   LYMPHSABS 1.3 01/23/2024 1017   MONOABS 0.7 01/23/2024 1017   EOSABS 0.0 01/23/2024 1017   BASOSABS 0.0 01/23/2024 1017    CMP     Component Value Date/Time   NA 137 01/23/2024 1017   K 3.3 (L) 01/23/2024 1017   CL 105 01/23/2024 1017   CO2 26 01/23/2024 1017   GLUCOSE 115 (H) 01/23/2024 1017   BUN 18 01/23/2024 1017   CREATININE 0.61 01/23/2024 1017   CREATININE 0.89 09/05/2020 0857   CALCIUM  8.9 01/23/2024 1017   PROT 6.3 (L) 01/23/2024 1017    ALBUMIN 3.9 01/23/2024 1017   AST 16 01/23/2024 1017   ALT 25 01/23/2024 1017   ALKPHOS 56 01/23/2024 1017   BILITOT 0.4 01/23/2024 1017   GFRNONAA >60 01/23/2024 1017     ASSESSMENT and THERAPY PLAN:   No problem-specific Assessment & Plan notes  found for this encounter.     All questions were answered. The patient knows to call the clinic with any problems, questions or concerns. We can certainly see the patient much sooner if necessary.  Total encounter time:*** minutes*in face-to-face visit time, chart review, lab review, care coordination, order entry, and documentation of the encounter time.    Morna Kendall, NP 01/23/24 11:22 AM Medical Oncology and Hematology [facility] [address] [place], [place] [address] Tel. [phone]    Fax. [phone]  *Total Encounter Time as defined by the Centers for Medicare and Medicaid Services includes, in addition to the face-to-face time of a patient visit (documented in the note above) non-face-to-face time: obtaining and reviewing outside history, ordering and reviewing medications, tests or procedures, care coordination (communications with other health care professionals or caregivers) and documentation in the medical record.

## 2024-01-23 NOTE — Patient Instructions (Signed)
Potassium Content of Foods  Potassium is a mineral found in many foods and drinks. It can affect how the heart works, affect blood pressure, and keep fluids and electrolytes balanced in the body. It is important not to have too much potassium (hyperkalemia) or too little potassium (hypokalemia) in the body, especially in the blood. Potassium is naturally found in many different types of whole foods, such as fruits, vegetables, meat, and dairy products. Processed foods tend to be lower in potassium. The amount of potassium you need each day depends on your age and any medical conditions you may have. General recommendations are: Females aged 19 and older: 2,600 mg per day. Males aged 19 and older: 3,400 mg per day. Talk with your health care provider or dietitian about how much potassium you need. What foods are high in potassium? Below are examples of foods that have greater than 200 mg of potassium per serving. Fruits Orange -- 1 medium (130 g) has 230 mg of potassium. Banana -- 1 medium (120 g) has 420 mg of potassium. Cantaloupe, chunks -- 1 cup (160 g) has 430 mg of potassium. Vegetables Potato, baked, without skin -- 1 medium (170 g) has 600 mg of potassium. Broccoli, chopped, cooked --  cup (77.5 g) has 230 mg of potassium. Tomato, chopped or sliced -- 1 cup (152 g) has 400 mg of potassium. Grains Cereal, bran with raisins -- 1 cup (59 g) has 360 mg of potassium. Granola with almonds --  cup (82 g) has 220 mg of potassium. Meats and other proteins Ground beef patty -- 4 ounces (113 g) has 240 mg of potassium. Kidney beans, boiled --  cup (130 g) has 350 mg of potassium. Almonds -- 1 ounce (approximately 22 nuts or 28 g) has 200 mg of potassium. Dairy Cow's milk, 1% -- 1 cup (237 mL) has 360 mg of potassium. Plain vanilla low-fat yogurt --  cup (184 g) has 220 mg of potassium. The items listed above may not be a complete list of foods high in potassium. Actual amounts of potassium  may be different depending on ripeness, shelf life, and food preparation. Contact a dietitian for more information. What foods are low in potassium? Below are examples of foods that have less than 200 mg of potassium per serving. Fruits Blueberries -- 1 cup (145 g) has 110 mg of potassium. Apple -- 1 medium (140 g) has 145 mg of potassium. Grapes -- 1 cup (160 g) has 175 mg of potassium. Vegetables Cabbage, raw -- 1 cup (70 g) has 120 mg of potassium. Cauliflower, chopped, cooked -- 1 cup (180 g) has 90 mg of potassium. Romaine lettuce, chopped -- 1 cup (56 g) has 120 mg of potassium. Grains Bagel, plain -- one 4-inch (10 cm) has 100 mg of potassium. Whole wheat bread -- 1 slice (26 g) has 70 mg of potassium. White rice, cooked -- 1 cup (163 g) has 50 mg of potassium. Meats and other proteins Tuna, light, canned in water -- 3 ounces (85 g) has 150 mg of potassium. Egg, fried -- 1 large (50 g) has 60 mg of potassium. Peanuts --1 ounce (35 nuts or 28 g) has 180 mg of potassium. Tofu --  cup (252 g) has 150 mg of potassium. Dairy Cheese (cheddar, colby, mozzarella, or provolone) -- 1 ounce (28 g) has 30 to 40 mg of potassium. The items listed above may not be a complete list of foods that are low in potassium. Actual amounts of   potassium may be different depending on ripeness, shelf life, and food preparation. Contact a dietitian for more information. Summary Potassium is a mineral found in many foods and drinks. It affects how the heart works, affects blood pressure, and keeps fluids and electrolytes balanced in the body. The amount of potassium you need each day depends on your age and any existing medical conditions you may have. Your health care provider or dietitian may recommend an amount of potassium that you should have each day. This information is not intended to replace advice given to you by your health care provider. Make sure you discuss any questions you have with your health  care provider. Document Revised: 02/20/2021 Document Reviewed: 02/01/2021 Elsevier Patient Education  2024 Elsevier Inc.  

## 2024-01-26 ENCOUNTER — Encounter: Payer: Self-pay | Admitting: Hematology and Oncology

## 2024-01-26 ENCOUNTER — Encounter: Payer: Self-pay | Admitting: Adult Health

## 2024-01-27 ENCOUNTER — Encounter: Payer: Self-pay | Admitting: Hematology and Oncology

## 2024-01-27 NOTE — Assessment & Plan Note (Signed)
 10/07/2023:Screening mammogram detected right breast mass at 12:00 measuring 5.1 cm, 1 axillary lymph node biopsy was benign biopsy of the mass grade 3 IDC with necrosis ER 30% weak, PR 0%, Ki67 60%, HER2 negative   Treatment plan: Neoadjuvant chemotherapy with Taxol  carbo Keytruda  followed by Adriamycin  Cytoxan  Keytruda  Breast conserving surgery with sentinel lymph node biopsy Adjuvant radiation therapy Antiestrogen therapy if the final path is estrogen receptor positive CT CAP and bone scan 11/04/2023: 4.7 cm right breast mass, right axillary lymph node 1.8 cm, 0.2 cm lung nodule benign Breast MRI 10/30/2023: Right breast malignancy 4.7 cm, enlarged right axillary lymph node ------------------------------------------------------------------------------------------------------------------------------------------------ Current treatment: Cycle 12 Taxol  carbo Keytruda 

## 2024-01-29 MED FILL — Fosaprepitant Dimeglumine For IV Infusion 150 MG (Base Eq): INTRAVENOUS | Qty: 5 | Status: AC

## 2024-01-30 ENCOUNTER — Inpatient Hospital Stay

## 2024-01-30 ENCOUNTER — Inpatient Hospital Stay: Admitting: Adult Health

## 2024-01-30 ENCOUNTER — Encounter: Payer: Self-pay | Admitting: Adult Health

## 2024-01-30 ENCOUNTER — Encounter: Payer: Self-pay | Admitting: Hematology and Oncology

## 2024-01-30 VITALS — BP 123/68 | HR 81 | Temp 98.3°F | Resp 16 | Ht 60.0 in | Wt 180.6 lb

## 2024-01-30 DIAGNOSIS — C50411 Malignant neoplasm of upper-outer quadrant of right female breast: Secondary | ICD-10-CM

## 2024-01-30 DIAGNOSIS — Z5112 Encounter for antineoplastic immunotherapy: Secondary | ICD-10-CM | POA: Diagnosis not present

## 2024-01-30 DIAGNOSIS — Z170 Estrogen receptor positive status [ER+]: Secondary | ICD-10-CM

## 2024-01-30 DIAGNOSIS — Z95828 Presence of other vascular implants and grafts: Secondary | ICD-10-CM

## 2024-01-30 LAB — CBC WITH DIFFERENTIAL (CANCER CENTER ONLY)
Abs Immature Granulocytes: 0.05 K/uL (ref 0.00–0.07)
Basophils Absolute: 0 K/uL (ref 0.0–0.1)
Basophils Relative: 1 %
Eosinophils Absolute: 0 K/uL (ref 0.0–0.5)
Eosinophils Relative: 0 %
HCT: 32.8 % — ABNORMAL LOW (ref 36.0–46.0)
Hemoglobin: 11 g/dL — ABNORMAL LOW (ref 12.0–15.0)
Immature Granulocytes: 1 %
Lymphocytes Relative: 39 %
Lymphs Abs: 1.7 K/uL (ref 0.7–4.0)
MCH: 29.1 pg (ref 26.0–34.0)
MCHC: 33.5 g/dL (ref 30.0–36.0)
MCV: 86.8 fL (ref 80.0–100.0)
Monocytes Absolute: 0.5 K/uL (ref 0.1–1.0)
Monocytes Relative: 10 %
Neutro Abs: 2.2 K/uL (ref 1.7–7.7)
Neutrophils Relative %: 49 %
Platelet Count: 309 K/uL (ref 150–400)
RBC: 3.78 MIL/uL — ABNORMAL LOW (ref 3.87–5.11)
RDW: 17.2 % — ABNORMAL HIGH (ref 11.5–15.5)
WBC Count: 4.5 K/uL (ref 4.0–10.5)
nRBC: 0 % (ref 0.0–0.2)

## 2024-01-30 LAB — CMP (CANCER CENTER ONLY)
ALT: 25 U/L (ref 0–44)
AST: 17 U/L (ref 15–41)
Albumin: 3.8 g/dL (ref 3.5–5.0)
Alkaline Phosphatase: 52 U/L (ref 38–126)
Anion gap: 7 (ref 5–15)
BUN: 11 mg/dL (ref 6–20)
CO2: 26 mmol/L (ref 22–32)
Calcium: 8.8 mg/dL — ABNORMAL LOW (ref 8.9–10.3)
Chloride: 104 mmol/L (ref 98–111)
Creatinine: 0.59 mg/dL (ref 0.44–1.00)
GFR, Estimated: >60 mL/min (ref >60)
Glucose, Bld: 111 mg/dL — ABNORMAL HIGH (ref 70–99)
Potassium: 3.5 mmol/L (ref 3.5–5.1)
Sodium: 137 mmol/L (ref 135–145)
Total Bilirubin: 0.4 mg/dL (ref 0.0–1.2)
Total Protein: 6.6 g/dL (ref 6.5–8.1)

## 2024-01-30 MED ORDER — SODIUM CHLORIDE 0.9 % IV SOLN
600.0000 mg/m2 | Freq: Once | INTRAVENOUS | Status: AC
  Administered 2024-01-30: 1120 mg via INTRAVENOUS
  Filled 2024-01-30: qty 56

## 2024-01-30 MED ORDER — SODIUM CHLORIDE 0.9 % IV SOLN
200.0000 mg | Freq: Once | INTRAVENOUS | Status: AC
  Administered 2024-01-30: 200 mg via INTRAVENOUS
  Filled 2024-01-30: qty 200

## 2024-01-30 MED ORDER — SODIUM CHLORIDE 0.9% FLUSH
10.0000 mL | Freq: Once | INTRAVENOUS | Status: AC
  Administered 2024-01-30: 10 mL

## 2024-01-30 MED ORDER — SODIUM CHLORIDE 0.9 % IV SOLN: INTRAVENOUS | Status: DC

## 2024-01-30 MED ORDER — DEXAMETHASONE SODIUM PHOSPHATE 10 MG/ML IJ SOLN
6.0000 mg | Freq: Once | INTRAMUSCULAR | Status: AC
  Administered 2024-01-30: 6 mg via INTRAVENOUS
  Filled 2024-01-30: qty 1

## 2024-01-30 MED ORDER — DOXORUBICIN HCL CHEMO IV INJECTION 2 MG/ML
60.0000 mg/m2 | Freq: Once | INTRAVENOUS | Status: AC
  Administered 2024-01-30: 112 mg via INTRAVENOUS
  Filled 2024-01-30: qty 56

## 2024-01-30 MED ORDER — SODIUM CHLORIDE 0.9 % IV SOLN
150.0000 mg | Freq: Once | INTRAVENOUS | Status: AC
  Administered 2024-01-30: 150 mg via INTRAVENOUS
  Filled 2024-01-30: qty 150

## 2024-01-30 MED ORDER — SODIUM CHLORIDE 0.9% FLUSH
10.0000 mL | INTRAVENOUS | Status: DC | PRN
  Administered 2024-01-30: 10 mL

## 2024-01-30 MED ORDER — PEGFILGRASTIM 6 MG/0.6ML ~~LOC~~ PSKT
6.0000 mg | PREFILLED_SYRINGE | Freq: Once | SUBCUTANEOUS | Status: AC
Start: 2024-01-30 — End: 2024-01-30
  Administered 2024-01-30: 6 mg via SUBCUTANEOUS
  Filled 2024-01-30: qty 0.6

## 2024-01-30 MED ORDER — PALONOSETRON HCL INJECTION 0.25 MG/5ML
0.2500 mg | Freq: Once | INTRAVENOUS | Status: AC
  Administered 2024-01-30: 0.25 mg via INTRAVENOUS
  Filled 2024-01-30: qty 5

## 2024-01-30 NOTE — Assessment & Plan Note (Signed)
 10/07/2023:Screening mammogram detected right breast mass at 12:00 measuring 5.1 cm, 1 axillary lymph node biopsy was benign biopsy of the mass grade 3 IDC with necrosis ER 30% weak, PR 0%, Ki67 60%, HER2 negative   Treatment plan: Neoadjuvant chemotherapy with Taxol  carbo Keytruda  followed by Adriamycin  Cytoxan  Keytruda  Breast conserving surgery with sentinel lymph node biopsy Adjuvant radiation therapy Antiestrogen therapy if the final path is estrogen receptor positive CT CAP and bone scan 11/04/2023: 4.7 cm right breast mass, right axillary lymph node 1.8 cm, 0.2 cm lung nodule benign Breast MRI 10/30/2023: Right breast malignancy 4.7 cm, enlarged right axillary lymph node ------------------------------------------------------------------------------------------------------------------------------------------------ Current treatment: Adriamycin , Cytoxan , Keytruda  C 1  Assessment and Plan Assessment & Plan Stage 3A triple negative breast cancer Due today for neoadjuvant chemotherapy with Adriamycin , Cytoxan , and Keytruda . Discussed the use of Decadron  post-chemotherapy to manage side effects. - Administer Adriamycin , Cytoxan , and Keytruda  as planned. - Schedule follow-up labs and appointment in one week to monitor side effects and response to treatment. - Instruct to take Decadron  one tablet the day after chemotherapy and two days after chemotherapy. - Advise to contact the clinic for any urgent side effects. - Schedule September and October appointments, preferably in the morning.  Mild anemia secondary to chemotherapy Mild anemia likely secondary to chemotherapy. Hemoglobin is slightly decreased, which is expected with the current chemotherapy regimen.  RTC in 1 week for labs and f/u.

## 2024-01-30 NOTE — Progress Notes (Signed)
 [facility] Cancer Follow up:    [name], NP [address] [place] [address]   DIAGNOSIS:  Cancer Staging  Malignant neoplasm of upper-outer quadrant of right breast in female, estrogen receptor positive (HCC) Staging form: Breast, AJCC 8th Edition - Clinical stage from 10/22/2023: Stage IIIA (cT3, cN0, cM0, G3, ER+, PR-, HER2-) - Signed by Odean Potts, MD on 10/22/2023 Stage prefix: Initial diagnosis Histologic grading system: 3 grade system Laterality: Right Staged by: Pathologist and managing physician Stage used in treatment planning: Yes National guidelines used in treatment planning: Yes Type of national guideline used in treatment planning: NCCN    SUMMARY OF ONCOLOGIC HISTORY: Oncology History  Malignant neoplasm of upper-outer quadrant of right breast in female, estrogen receptor positive (HCC)  10/07/2023 Initial Diagnosis   Screening mammogram detected right breast mass at 12:00 measuring 5.1 cm, 1 axillary lymph node biopsy was benign biopsy of the mass grade 3 IDC with necrosis ER 30% weak, PR 0%, Ki67 60%, HER2 negative   10/22/2023 Cancer Staging   Staging form: Breast, AJCC 8th Edition - Clinical stage from 10/22/2023: Stage IIIA (cT3, cN0, cM0, G3, ER+, PR-, HER2-) - Signed by Odean Potts, MD on 10/22/2023 Stage prefix: Initial diagnosis Histologic grading system: 3 grade system Laterality: Right Staged by: Pathologist and managing physician Stage used in treatment planning: Yes National guidelines used in treatment planning: Yes Type of national guideline used in treatment planning: NCCN   11/02/2023 Genetic Testing   Negative [facility] CancerNext +RNAinsight Panel.  VUS in BARD1 at p.K438E (c.1312A>G).  Report date is 11/02/2023.   The [facility] CancerNext+RNAinsight Panel includes sequencing, rearrangement analysis, and RNA analysis for the following 40 genes: APC, ATM, BAP1, BARD1, BMPR1A, BRCA1, BRCA2, BRIP1, CDH1, CDKN2A,  CHEK2, FH, FLCN, MET, MLH1, MSH2, MSH6, MUTYH, NF1, NTHL1, PALB2, PMS2, PTEN, RAD51C, RAD51D, RSP20, SMAD4, STK11, TP53, TSC1, TSC2, and VHL (sequencing and deletion/duplication); AXIN2, HOXB13, MBD4, MSH3, POLD1 and POLE (sequencing only); EPCAM and GREM1 (deletion/duplication only).   11/06/2023 -  Chemotherapy   Patient is on Treatment Plan : BREAST Pembrolizumab  (200) D1 + Carboplatin  (1.5) D1,8,15 + Paclitaxel  (80) D1,8,15 q21d X 4 cycles / Pembrolizumab  (200) D1 + AC D1 q21d x 4 cycles       CURRENT THERAPY: Adriamycin /Cytoxan /Keytruda   INTERVAL HISTORY:  Discussed the use of AI scribe software for clinical note transcription with the patient, who gave verbal consent to proceed.  History of Present Illness Gabrielle Cross is a 58 year old female with stage 3A functionally triple negative breast cancer who presents for follow-up and evaluation prior to receiving neoadjuvant chemotherapy with Adriamycin , Cytoxan , and Keytruda .  She just completed Taxol /Carbo/Keytruda .  She reports tolerating it moderately well.  She has grade 2 peripheral neuropathy that is not worsened.    Patient Active Problem List   Diagnosis Date Noted   Acute folliculitis 12/09/2023   Chronic hyperglycemia 12/09/2023   Diuretic-induced hypokalemia 12/09/2023   Diabetes mellitus (HCC) 12/09/2023   Port-A-Cath in place 11/13/2023   Genetic testing 11/04/2023   Malignant neoplasm of upper-outer quadrant of right breast in female, estrogen receptor positive (HCC) 10/20/2023   Dyslipidemia, goal LDL below 130 09/17/2022   Acne rosacea 08/23/2022   Inflamed seborrheic keratosis 08/23/2022   Keratosis pilaris 08/23/2022   Vitamin D  deficiency 04/19/2021   Loud snoring 04/18/2021   Primary hypertension 04/18/2021   Encounter for general adult medical examination with abnormal findings 04/18/2021   Visit for screening mammogram 04/18/2021  Erythrocytosis 02/22/2021   Squamous cell cancer of skin of left cheek  03/24/2019   Migraines 01/09/2012    is allergic to shellfish allergy.  MEDICAL HISTORY: Past Medical History:  Diagnosis Date   Arthritis    knees   Blood transfusion without reported diagnosis 2002   Carpal tunnel syndrome, bilateral    Elevated hemoglobin (HCC)    Hypertension    Incompetent cervix    DELIVERED AT 31 WEEKS   Migraines    OSA (obstructive sleep apnea)    Rx for autopap, does not use   Ruptured ectopic pregnancy 2002   WITH BLOOD TRANSFUSION 2U'S PRBC'S   Wears glasses     SURGICAL HISTORY: Past Surgical History:  Procedure Laterality Date   BREAST BIOPSY Right 10/07/2023   US  RT BREAST BX W LOC DEV 1ST LESION IMG BX SPEC US  GUIDE 10/07/2023 [facility] MAMMOGRAPHY   CERCLAGE PLACEMENT     DILATATION & CURETTAGE/HYSTEROSCOPY WITH MYOSURE N/A 04/19/2022   Procedure: DILATATION & CURETTAGE/HYSTEROSCOPY WITH MYOSURE;  Surgeon: Lavoie, Marie-Lyne, MD;  Location: [facility];  Service: Gynecology;  Laterality: N/A;   IR IMAGING GUIDED PORT INSERTION  11/10/2023   PORTACATH PLACEMENT Left 11/05/2023   Procedure: ATTEMPTED INSERTION, TUNNELED CENTRAL VENOUS DEVICE, WITH PORT;  Surgeon: Curvin Deward MOULD, MD;  Location: [facility];  Service: General;  Laterality: Left;  PORT PLACEMENT WITH ULTRASOUND GUIDANCE   RESECTOSCOPIC POLYPECTOMY  2001   SALPINGECTOMY Right    RIGHT, RUPTURED ECTOPIC   SKIN CANCER EXCISION  2020   tangential bx  02/22/2019    SOCIAL HISTORY: Social History   Socioeconomic History   Marital status: Married    Spouse name: Not on file   Number of children: Not on file   Years of education: Not on file   Highest education level: Not on file  Occupational History   Not on file  Tobacco Use   Smoking status: Never    Passive exposure: Never   Smokeless tobacco: Never  Vaping Use   Vaping status: Never Used  Substance and Sexual Activity   Alcohol use: Yes    Comment: weekends - social    Drug use: No   Sexual  activity: Yes    Partners: Male    Birth control/protection: Post-menopausal    Comment: intercoure age 68, less than 5 sexual partners, des neg  Other Topics Concern   Not on file  Social History Narrative   Not on file   Social Drivers of Health   Financial Resource Strain: Low Risk  (12/08/2023)   Overall Financial Resource Strain (CARDIA)    Difficulty of Paying Living Expenses: Not hard at all  Food Insecurity: No Food Insecurity (12/08/2023)   Hunger Vital Sign    Worried About Running Out of Food in the Last Year: Never true    Ran Out of Food in the Last Year: Never true  Transportation Needs: No Transportation Needs (12/08/2023)   PRAPARE - Administrator, Civil Service (Medical): No    Lack of Transportation (Non-Medical): No  Physical Activity: Insufficiently Active (12/08/2023)   Exercise Vital Sign    Days of Exercise per Week: 1 day    Minutes of Exercise per Session: 30 min  Stress: No Stress Concern Present (12/08/2023)   [employer] of Occupational Health - Occupational Stress Questionnaire    Feeling of Stress: Not at all  Social Connections: Unknown (12/08/2023)   Social Connection and Isolation Panel  Frequency of Communication with Friends and Family: Once a week    Frequency of Social Gatherings with Friends and Family: Patient declined    Attends Religious Services: Patient declined    Database administrator or Organizations: No    Attends Engineer, structural: Not on file    Marital Status: Married  Catering manager Violence: Not At Risk (10/22/2023)   Humiliation, Afraid, Rape, and Kick questionnaire    Fear of Current or Ex-Partner: No    Emotionally Abused: No    Physically Abused: No    Sexually Abused: No    FAMILY HISTORY: Family History  Problem Relation Age of Onset   Colon polyps Mother    Hyperlipidemia Father    Colon polyps Sister    Colon polyps Brother    Colon cancer Neg Hx    Esophageal cancer Neg Hx     Rectal cancer Neg Hx    Stomach cancer Neg Hx    Sleep apnea Neg Hx     Review of Systems  Constitutional:  Negative for appetite change, chills, fatigue, fever and unexpected weight change.  HENT:   Negative for hearing loss, lump/mass and trouble swallowing.   Eyes:  Negative for eye problems and icterus.  Respiratory:  Negative for chest tightness, cough and shortness of breath.   Cardiovascular:  Negative for chest pain, leg swelling and palpitations.  Gastrointestinal:  Negative for abdominal distention, abdominal pain, constipation, diarrhea, nausea and vomiting.  Endocrine: Negative for hot flashes.  Genitourinary:  Negative for difficulty urinating.   Musculoskeletal:  Negative for arthralgias.  Skin:  Negative for itching and rash.  Neurological:  Negative for dizziness, extremity weakness, headaches and numbness.  Hematological:  Negative for adenopathy. Does not bruise/bleed easily.  Psychiatric/Behavioral:  Negative for depression. The patient is not nervous/anxious.       PHYSICAL EXAMINATION   Onc Performance Status - 01/30/24 1100       KPS SCALE   KPS % SCORE Normal, no compliants, no evidence of disease          Vitals:   01/30/24 1153  BP: 123/68  Pulse: 81  Resp: 16  Temp: 98.3 F (36.8 C)  SpO2: 99%    Physical Exam Constitutional:      General: She is not in acute distress.    Appearance: Normal appearance. She is not toxic-appearing.  HENT:     Head: Normocephalic and atraumatic.     Mouth/Throat:     Mouth: Mucous membranes are moist.     Pharynx: Oropharynx is clear. No oropharyngeal exudate or posterior oropharyngeal erythema.  Eyes:     General: No scleral icterus. Cardiovascular:     Rate and Rhythm: Normal rate and regular rhythm.     Pulses: Normal pulses.     Heart sounds: Normal heart sounds.  Pulmonary:     Effort: Pulmonary effort is normal.     Breath sounds: Normal breath sounds.  Abdominal:     General: Abdomen is  flat. Bowel sounds are normal. There is no distension.     Palpations: Abdomen is soft.     Tenderness: There is no abdominal tenderness.  Musculoskeletal:        General: No swelling.     Cervical back: Neck supple.  Lymphadenopathy:     Cervical: No cervical adenopathy.  Skin:    General: Skin is warm and dry.     Findings: No rash.  Neurological:     General: No  focal deficit present.     Mental Status: She is alert.  Psychiatric:        Mood and Affect: Mood normal.        Behavior: Behavior normal.     LABORATORY DATA:  CBC    Component Value Date/Time   WBC 4.5 01/30/2024 1102   WBC 5.6 09/17/2022 0830   RBC 3.78 (L) 01/30/2024 1102   HGB 11.0 (L) 01/30/2024 1102   HCT 32.8 (L) 01/30/2024 1102   PLT 309 01/30/2024 1102   MCV 86.8 01/30/2024 1102   MCH 29.1 01/30/2024 1102   MCHC 33.5 01/30/2024 1102   RDW 17.2 (H) 01/30/2024 1102   LYMPHSABS 1.7 01/30/2024 1102   MONOABS 0.5 01/30/2024 1102   EOSABS 0.0 01/30/2024 1102   BASOSABS 0.0 01/30/2024 1102    CMP     Component Value Date/Time   NA 137 01/30/2024 1102   K 3.5 01/30/2024 1102   CL 104 01/30/2024 1102   CO2 26 01/30/2024 1102   GLUCOSE 111 (H) 01/30/2024 1102   BUN 11 01/30/2024 1102   CREATININE 0.59 01/30/2024 1102   CREATININE 0.89 09/05/2020 0857   CALCIUM  8.8 (L) 01/30/2024 1102   PROT 6.6 01/30/2024 1102   ALBUMIN 3.8 01/30/2024 1102   AST 17 01/30/2024 1102   ALT 25 01/30/2024 1102   ALKPHOS 52 01/30/2024 1102   BILITOT 0.4 01/30/2024 1102   GFRNONAA >60 01/30/2024 1102     ASSESSMENT and THERAPY PLAN:   Malignant neoplasm of upper-outer quadrant of right breast in female, estrogen receptor positive (HCC) 10/07/2023:Screening mammogram detected right breast mass at 12:00 measuring 5.1 cm, 1 axillary lymph node biopsy was benign biopsy of the mass grade 3 IDC with necrosis ER 30% weak, PR 0%, Ki67 60%, HER2 negative   Treatment plan: Neoadjuvant chemotherapy with Taxol  carbo  Keytruda  followed by Adriamycin  Cytoxan  Keytruda  Breast conserving surgery with sentinel lymph node biopsy Adjuvant radiation therapy Antiestrogen therapy if the final path is estrogen receptor positive CT CAP and bone scan 11/04/2023: 4.7 cm right breast mass, right axillary lymph node 1.8 cm, 0.2 cm lung nodule benign Breast MRI 10/30/2023: Right breast malignancy 4.7 cm, enlarged right axillary lymph node ------------------------------------------------------------------------------------------------------------------------------------------------ Current treatment: Adriamycin , Cytoxan , Keytruda  C 1  Assessment and Plan Assessment & Plan Stage 3A triple negative breast cancer Due today for neoadjuvant chemotherapy with Adriamycin , Cytoxan , and Keytruda . Discussed the use of Decadron  post-chemotherapy to manage side effects. - Administer Adriamycin , Cytoxan , and Keytruda  as planned. - Schedule follow-up labs and appointment in one week to monitor side effects and response to treatment. - Instruct to take Decadron  one tablet the day after chemotherapy and two days after chemotherapy. - Advise to contact the clinic for any urgent side effects. - Schedule September and October appointments, preferably in the morning.  Mild anemia secondary to chemotherapy Mild anemia likely secondary to chemotherapy. Hemoglobin is slightly decreased, which is expected with the current chemotherapy regimen.  RTC in 1 week for labs and f/u.        All questions were answered. The patient knows to call the clinic with any problems, questions or concerns. We can certainly see the patient much sooner if necessary.  Total encounter time:20 minutes*in face-to-face visit time, chart review, lab review, care coordination, order entry, and documentation of the encounter time.    Morna Kendall, NP 01/30/24 3:10 PM Medical Oncology and Hematology [facility] [address] [place], [place]  [address] Tel. [phone]  Fax. [phone]  *Total Encounter Time as defined by the Centers for Medicare and Medicaid Services includes, in addition to the face-to-face time of a patient visit (documented in the note above) non-face-to-face time: obtaining and reviewing outside history, ordering and reviewing medications, tests or procedures, care coordination (communications with other health care professionals or caregivers) and documentation in the medical record.

## 2024-01-30 NOTE — Progress Notes (Signed)
 Patient's treatment plan updated, changing their neulasta  to neulasta  onpro. Removed their day 3 supportive care plan originally planned for receiving neulasta  injection.   Treatment plan reviewed by Wilma Dollar, PharmD.   Alfonso MARLA Buys, PharmD Pharmacy Resident  01/30/2024 2:28 PM

## 2024-01-30 NOTE — Patient Instructions (Addendum)
 [facility] - A DEPT OF [facility]  Discharge Instructions: Thank you for choosing [facility] to provide your oncology and hematology care.   If you have a lab appointment with the Cancer Center, please go directly to the Cancer Center and check in at the registration area.   Wear comfortable clothing and clothing appropriate for easy access to any Portacath or PICC line.   We strive to give you quality time with your provider. You may need to reschedule your appointment if you arrive late (15 or more minutes).  Arriving late affects you and other patients whose appointments are after yours.  Also, if you miss three or more appointments without notifying the office, you may be dismissed from the clinic at the provider's discretion.      For prescription refill requests, have your pharmacy contact our office and allow 72 hours for refills to be completed.    Today you received the following chemotherapy and/or immunotherapy agents: Pembrolizumab  (Keytruda ), Doxorubicin  (Adriamycin ), & Cyclophosphamide  (Cytoxan )    To help prevent nausea and vomiting after your treatment, we encourage you to take your nausea medication as directed.  BELOW ARE SYMPTOMS THAT SHOULD BE REPORTED IMMEDIATELY: *FEVER GREATER THAN 100.4 F (38 C) OR HIGHER *CHILLS OR SWEATING *NAUSEA AND VOMITING THAT IS NOT CONTROLLED WITH YOUR NAUSEA MEDICATION *UNUSUAL SHORTNESS OF BREATH *UNUSUAL BRUISING OR BLEEDING *URINARY PROBLEMS (pain or burning when urinating, or frequent urination) *BOWEL PROBLEMS (unusual diarrhea, constipation, pain near the anus) TENDERNESS IN MOUTH AND THROAT WITH OR WITHOUT PRESENCE OF ULCERS (sore throat, sores in mouth, or a toothache) UNUSUAL RASH, SWELLING OR PAIN  UNUSUAL VAGINAL DISCHARGE OR ITCHING   Items with * indicate a potential emergency and should be followed up as soon as possible or go to the Emergency Department if any problems  should occur.  Please show the CHEMOTHERAPY ALERT CARD or IMMUNOTHERAPY ALERT CARD at check-in to the Emergency Department and triage nurse.  Should you have questions after your visit or need to cancel or reschedule your appointment, please contact [facility] - A DEPT OF [facility]  Dept: [phone]  and follow the prompts.  Office hours are 8:00 a.m. to 4:30 p.m. Monday - Friday. Please note that voicemails left after 4:00 p.m. may not be returned until the following business day.  We are closed weekends and major holidays. You have access to a nurse at all times for urgent questions. Please call the main number to the clinic Dept: [phone] and follow the prompts.   For any non-urgent questions, you may also contact your provider using MyChart. We now offer e-Visits for anyone 39 and older to request care online for non-urgent symptoms. For details visit [email or web address].   Also download the MyChart app! Go to the app store, search MyChart, open the app, select [place], and log in with your MyChart username and password.    Pegfilgrastim  Injection  What is this medication? PEGFILGRASTIM  (PEG fil gra stim) lowers the risk of infection in people who are receiving chemotherapy. It works by Systems analyst make more white blood cells, which protects your body from infection. It may also be used to help people who have been exposed to high doses of radiation. This medicine may be used for other purposes; ask your health care provider or pharmacist if you have questions. COMMON BRAND NAME(S): Fulphila , Fylnetra , Neulasta , Nyvepria , Stimufend , UDENYCA , UDENYCA   ONBODY, Ziextenzo  What should I tell my care team before I take this medication? They need to know if you have any of these conditions: Kidney disease Latex allergy Ongoing radiation therapy Sickle cell disease Skin reactions to acrylic adhesives (On-Body Injector only) An unusual or  allergic reaction to pegfilgrastim , filgrastim, other medications, foods, dyes, or preservatives Pregnant or trying to get pregnant Breast-feeding How should I use this medication? This medication is for injection under the skin. If you get this medication at home, you will be taught how to prepare and give the pre-filled syringe or how to use the On-body Injector. Refer to the patient Instructions for Use for detailed instructions. Use exactly as directed. Tell your care team immediately if you suspect that the On-body Injector may not have performed as intended or if you suspect the use of the On-body Injector resulted in a missed or partial dose. It is important that you put your used needles and syringes in a special sharps container. Do not put them in a trash can. If you do not have a sharps container, call your pharmacist or care team to get one. Talk to your care team about the use of this medication in children. While this medication may be prescribed for selected conditions, precautions do apply. Overdosage: If you think you have taken too much of this medicine contact a poison control center or emergency room at once. NOTE: This medicine is only for you. Do not share this medicine with others. What if I miss a dose? It is important not to miss your dose. Call your care team if you miss your dose. If you miss a dose due to an On-body Injector failure or leakage, a new dose should be administered as soon as possible using a single prefilled syringe for manual use. What may interact with this medication? Interactions have not been studied. This list may not describe all possible interactions. Give your health care provider a list of all the medicines, herbs, non-prescription drugs, or dietary supplements you use. Also tell them if you smoke, drink alcohol, or use illegal drugs. Some items may interact with your medicine. What should I watch for while using this medication? Your condition will  be monitored carefully while you are receiving this medication. You may need blood work done while you are taking this medication. Talk to your care team about your risk of cancer. You may be more at risk for certain types of cancer if you take this medication. If you are going to need a MRI, CT scan, or other procedure, tell your care team that you are using this medication (On-Body Injector only). What side effects may I notice from receiving this medication? Side effects that you should report to your care team as soon as possible: Allergic reactions--skin rash, itching, hives, swelling of the face, lips, tongue, or throat Capillary leak syndrome--stomach or muscle pain, unusual weakness or fatigue, feeling faint or lightheaded, decrease in the amount of urine, swelling of the ankles, hands, or feet, trouble breathing High white blood cell level--fever, fatigue, trouble breathing, night sweats, change in vision, weight loss Inflammation of the aorta--fever, fatigue, back, chest, or stomach pain, severe headache Kidney injury (glomerulonephritis)--decrease in the amount of urine, red or dark brown urine, foamy or bubbly urine, swelling of the ankles, hands, or feet Shortness of breath or trouble breathing Spleen injury--pain in upper left stomach or shoulder Unusual bruising or bleeding Side effects that usually do not require medical attention (report to your  care team if they continue or are bothersome): Bone pain Pain in the hands or feet This list may not describe all possible side effects. Call your doctor for medical advice about side effects. You may report side effects to FDA at 1-800-FDA-1088. Where should I keep my medication? Keep out of the reach of children. If you are using this medication at home, you will be instructed on how to store it. Throw away any unused medication after the expiration date on the label. NOTE: This sheet is a summary. It may not cover all possible  information. If you have questions about this medicine, talk to your doctor, pharmacist, or health care provider.  2024 Elsevier/Gold Standard (2021-04-20 00:00:00)

## 2024-01-31 ENCOUNTER — Ambulatory Visit

## 2024-02-03 ENCOUNTER — Inpatient Hospital Stay: Attending: Hematology and Oncology

## 2024-02-03 ENCOUNTER — Encounter: Payer: Self-pay | Admitting: Hematology and Oncology

## 2024-02-03 NOTE — Telephone Encounter (Signed)
 Called pt to see how she did with her recent treatment.  She reports doing well except upset stomach. She denies N/V, diarrhea/constipation but states discomfort in her belly. She is eating & drinking fluids.  She hasn't taken anything for nausea which I suggested she might try to see if that settles her tummy.  She reports taking some tums.  She did take the steroids prescribed but took with food.  She knows her next appt & how to reach us  if needed. Encouraged to call if symptoms are worse.  She is working from home.  She had no problem with the OnPro.

## 2024-02-03 NOTE — Telephone Encounter (Signed)
-----   Message from Nurse Emmalee P sent at 01/30/2024  4:41 PM EDT ----- Regarding: Dr. Odean First Timer- Patient Call Back Dr. Odean First Timer- Patient Call Back   Will Barnacle first time Keytruda  + Adriamycin  + Cyclophosphamide  (01/30/24). Pt also first time Neulasta  Onpro user. Pt tolerated treatment well, no concerns. Stable at discharge and ambulatory to lobby.

## 2024-02-04 ENCOUNTER — Other Ambulatory Visit: Payer: Self-pay

## 2024-02-05 ENCOUNTER — Other Ambulatory Visit: Payer: Self-pay | Admitting: *Deleted

## 2024-02-05 DIAGNOSIS — Z170 Estrogen receptor positive status [ER+]: Secondary | ICD-10-CM

## 2024-02-06 ENCOUNTER — Encounter: Payer: Self-pay | Admitting: Adult Health

## 2024-02-06 ENCOUNTER — Inpatient Hospital Stay (HOSPITAL_BASED_OUTPATIENT_CLINIC_OR_DEPARTMENT_OTHER): Admitting: Adult Health

## 2024-02-06 ENCOUNTER — Inpatient Hospital Stay: Attending: Hematology and Oncology

## 2024-02-06 ENCOUNTER — Other Ambulatory Visit (HOSPITAL_COMMUNITY): Payer: Self-pay

## 2024-02-06 VITALS — BP 110/65 | HR 74 | Temp 97.3°F | Resp 16 | Ht 60.0 in | Wt 176.3 lb

## 2024-02-06 DIAGNOSIS — T451X5A Adverse effect of antineoplastic and immunosuppressive drugs, initial encounter: Secondary | ICD-10-CM | POA: Diagnosis not present

## 2024-02-06 DIAGNOSIS — C50411 Malignant neoplasm of upper-outer quadrant of right female breast: Secondary | ICD-10-CM | POA: Diagnosis not present

## 2024-02-06 DIAGNOSIS — Z170 Estrogen receptor positive status [ER+]: Secondary | ICD-10-CM

## 2024-02-06 DIAGNOSIS — D701 Agranulocytosis secondary to cancer chemotherapy: Secondary | ICD-10-CM | POA: Diagnosis not present

## 2024-02-06 DIAGNOSIS — Z1732 Human epidermal growth factor receptor 2 negative status: Secondary | ICD-10-CM | POA: Diagnosis not present

## 2024-02-06 DIAGNOSIS — Z79620 Long term (current) use of immunosuppressive biologic: Secondary | ICD-10-CM | POA: Diagnosis not present

## 2024-02-06 DIAGNOSIS — Z5111 Encounter for antineoplastic chemotherapy: Secondary | ICD-10-CM | POA: Diagnosis present

## 2024-02-06 DIAGNOSIS — R5383 Other fatigue: Secondary | ICD-10-CM | POA: Diagnosis not present

## 2024-02-06 DIAGNOSIS — Z1722 Progesterone receptor negative status: Secondary | ICD-10-CM | POA: Insufficient documentation

## 2024-02-06 DIAGNOSIS — Z5112 Encounter for antineoplastic immunotherapy: Secondary | ICD-10-CM | POA: Insufficient documentation

## 2024-02-06 LAB — CBC WITH DIFFERENTIAL (CANCER CENTER ONLY)
Abs Immature Granulocytes: 0.05 K/uL (ref 0.00–0.07)
Basophils Absolute: 0 K/uL (ref 0.0–0.1)
Basophils Relative: 1 %
Eosinophils Absolute: 0 K/uL (ref 0.0–0.5)
Eosinophils Relative: 2 %
HCT: 32.1 % — ABNORMAL LOW (ref 36.0–46.0)
Hemoglobin: 11.1 g/dL — ABNORMAL LOW (ref 12.0–15.0)
Immature Granulocytes: 3 %
Lymphocytes Relative: 48 %
Lymphs Abs: 0.7 K/uL (ref 0.7–4.0)
MCH: 29.6 pg (ref 26.0–34.0)
MCHC: 34.6 g/dL (ref 30.0–36.0)
MCV: 85.6 fL (ref 80.0–100.0)
Monocytes Absolute: 0.3 K/uL (ref 0.1–1.0)
Monocytes Relative: 19 %
Neutro Abs: 0.4 K/uL — CL (ref 1.7–7.7)
Neutrophils Relative %: 27 %
Platelet Count: 166 K/uL (ref 150–400)
RBC: 3.75 MIL/uL — ABNORMAL LOW (ref 3.87–5.11)
RDW: 16.8 % — ABNORMAL HIGH (ref 11.5–15.5)
Smear Review: NORMAL
WBC Count: 1.5 K/uL — ABNORMAL LOW (ref 4.0–10.5)
nRBC: 0 % (ref 0.0–0.2)

## 2024-02-06 LAB — CMP (CANCER CENTER ONLY)
ALT: 13 U/L (ref 0–44)
AST: 10 U/L — ABNORMAL LOW (ref 15–41)
Albumin: 3.9 g/dL (ref 3.5–5.0)
Alkaline Phosphatase: 71 U/L (ref 38–126)
Anion gap: 7 (ref 5–15)
BUN: 12 mg/dL (ref 6–20)
CO2: 27 mmol/L (ref 22–32)
Calcium: 8.9 mg/dL (ref 8.9–10.3)
Chloride: 102 mmol/L (ref 98–111)
Creatinine: 0.5 mg/dL (ref 0.44–1.00)
GFR, Estimated: >60 mL/min (ref >60)
Glucose, Bld: 100 mg/dL — ABNORMAL HIGH (ref 70–99)
Potassium: 3.5 mmol/L (ref 3.5–5.1)
Sodium: 136 mmol/L (ref 135–145)
Total Bilirubin: 0.4 mg/dL (ref 0.0–1.2)
Total Protein: 6.7 g/dL (ref 6.5–8.1)

## 2024-02-06 MED ORDER — CIPROFLOXACIN HCL 500 MG PO TABS
500.0000 mg | ORAL_TABLET | Freq: Two times a day (BID) | ORAL | 0 refills | Status: DC
  Filled 2024-02-06 (×2): qty 14, 7d supply, fill #0

## 2024-02-06 NOTE — Progress Notes (Signed)
 [facility] Cancer Follow up:    Gabrielle Shasta NOVAK, NP [address] [place] [address]   DIAGNOSIS:  Cancer Staging  Malignant neoplasm of upper-outer quadrant of right breast in female, estrogen receptor positive (HCC) Staging form: Breast, AJCC 8th Edition - Clinical stage from 10/22/2023: Stage IIIA (cT3, cN0, cM0, G3, ER+, PR-, HER2-) - Signed by Odean Potts, MD on 10/22/2023 Stage prefix: Initial diagnosis Histologic grading system: 3 grade system Laterality: Right Staged by: Pathologist and managing physician Stage used in treatment planning: Yes National guidelines used in treatment planning: Yes Type of national guideline used in treatment planning: NCCN    SUMMARY OF ONCOLOGIC HISTORY: Oncology History  Malignant neoplasm of upper-outer quadrant of right breast in female, estrogen receptor positive (HCC)  10/07/2023 Initial Diagnosis   Screening mammogram detected right breast mass at 12:00 measuring 5.1 cm, 1 axillary lymph node biopsy was benign biopsy of the mass grade 3 IDC with necrosis ER 30% weak, PR 0%, Ki67 60%, HER2 negative   10/22/2023 Cancer Staging   Staging form: Breast, AJCC 8th Edition - Clinical stage from 10/22/2023: Stage IIIA (cT3, cN0, cM0, G3, ER+, PR-, HER2-) - Signed by Odean Potts, MD on 10/22/2023 Stage prefix: Initial diagnosis Histologic grading system: 3 grade system Laterality: Right Staged by: Pathologist and managing physician Stage used in treatment planning: Yes National guidelines used in treatment planning: Yes Type of national guideline used in treatment planning: NCCN   11/02/2023 Genetic Testing   Negative [facility] Cancer[facility] +RNAinsight Panel.  VUS in BARD1 at p.K438E (c.1312A>G).  Report date is 11/02/2023.   The [facility] CancerNext+RNAinsight Panel includes sequencing, rearrangement analysis, and RNA analysis for the following 40 genes: APC, ATM, BAP1, BARD1, BMPR1A, BRCA1, BRCA2, BRIP1, CDH1, CDKN2A,  CHEK2, FH, FLCN, MET, MLH1, MSH2, MSH6, MUTYH, NF1, NTHL1, PALB2, PMS2, PTEN, RAD51C, RAD51D, RSP20, SMAD4, STK11, TP53, TSC1, TSC2, and VHL (sequencing and deletion/duplication); AXIN2, HOXB13, MBD4, MSH3, POLD1 and POLE (sequencing only); EPCAM and GREM1 (deletion/duplication only).   11/06/2023 -  Chemotherapy   Patient is on Treatment Plan : BREAST Pembrolizumab  (200) D1 + Carboplatin  (1.5) D1,8,15 + Paclitaxel  (80) D1,8,15 q21d X 4 cycles / Pembrolizumab  (200) D1 + AC D1 q21d x 4 cycles       CURRENT THERAPY: adriamycin /cytoxan /keytruda   INTERVAL HISTORY:  She just received her first cycle of adriamycin /cytoxan /keytruda  last week and tolerated it well.  She note some fatiuge, but denies any significant issues.     Patient Active Problem List   Diagnosis Date Noted   Chemotherapy induced neutropenia (HCC) 02/06/2024   Acute folliculitis 12/09/2023   Chronic hyperglycemia 12/09/2023   Diuretic-induced hypokalemia 12/09/2023   Diabetes mellitus (HCC) 12/09/2023   Port-A-Cath in place 11/13/2023   Genetic testing 11/04/2023   Malignant neoplasm of upper-outer quadrant of right breast in female, estrogen receptor positive (HCC) 10/20/2023   Dyslipidemia, goal LDL below 130 09/17/2022   Acne rosacea 08/23/2022   Inflamed seborrheic keratosis 08/23/2022   Keratosis pilaris 08/23/2022   Vitamin D  deficiency 04/19/2021   Loud snoring 04/18/2021   Primary hypertension 04/18/2021   Encounter for general adult medical examination with abnormal findings 04/18/2021   Visit for screening mammogram 04/18/2021   Erythrocytosis 02/22/2021   Squamous cell cancer of skin of left cheek 03/24/2019   Migraines 01/09/2012    is allergic to shellfish allergy.  MEDICAL HISTORY: Past Medical History:  Diagnosis Date   Arthritis    knees   Blood transfusion without  reported diagnosis 2002   Carpal tunnel syndrome, bilateral    Elevated hemoglobin (HCC)    Hypertension    Incompetent cervix     DELIVERED AT 31 WEEKS   Migraines    OSA (obstructive sleep apnea)    Rx for autopap, does not use   Ruptured ectopic pregnancy 2002   WITH BLOOD TRANSFUSION 2U'S PRBC'S   Wears glasses     SURGICAL HISTORY: Past Surgical History:  Procedure Laterality Date   BREAST BIOPSY Right 10/07/2023   US  RT BREAST BX W LOC DEV 1ST LESION IMG BX SPEC US  GUIDE 10/07/2023 [facility] MAMMOGRAPHY   CERCLAGE PLACEMENT     DILATATION & CURETTAGE/HYSTEROSCOPY WITH MYOSURE N/A 04/19/2022   Procedure: DILATATION & CURETTAGE/HYSTEROSCOPY WITH MYOSURE;  Surgeon: Lavoie, Marie-Lyne, MD;  Location:  SURGERY CENTER;  Service: Gynecology;  Laterality: N/A;   IR IMAGING GUIDED PORT INSERTION  11/10/2023   PORTACATH PLACEMENT Left 11/05/2023   Procedure: ATTEMPTED INSERTION, TUNNELED CENTRAL VENOUS DEVICE, WITH PORT;  Surgeon: Curvin Deward MOULD, MD;  Location: [facility];  Service: General;  Laterality: Left;  PORT PLACEMENT WITH ULTRASOUND GUIDANCE   RESECTOSCOPIC POLYPECTOMY  2001   SALPINGECTOMY Right    RIGHT, RUPTURED ECTOPIC   SKIN CANCER EXCISION  2020   tangential bx  02/22/2019    SOCIAL HISTORY: Social History   Socioeconomic History   Marital status: Married    Spouse name: Not on file   Number of children: Not on file   Years of education: Not on file   Highest education level: Not on file  Occupational History   Not on file  Tobacco Use   Smoking status: Never    Passive exposure: Never   Smokeless tobacco: Never  Vaping Use   Vaping status: Never Used  Substance and Sexual Activity   Alcohol use: Yes    Comment: weekends - social    Drug use: No   Sexual activity: Yes    Partners: Male    Birth control/protection: Post-menopausal    Comment: intercoure age 38, less than 5 sexual partners, des neg  Other Topics Concern   Not on file  Social History Narrative   Not on file   Social Drivers of Health   Financial Resource Strain: Low Risk  (12/08/2023)   Overall  Financial Resource Strain (CARDIA)    Difficulty of Paying Living Expenses: Not hard at all  Food Insecurity: No Food Insecurity (12/08/2023)   Hunger Vital Sign    Worried About Running Out of Food in the Last Year: Never true    Ran Out of Food in the Last Year: Never true  Transportation Needs: No Transportation Needs (12/08/2023)   PRAPARE - Administrator, Civil Service (Medical): No    Lack of Transportation (Non-Medical): No  Physical Activity: Insufficiently Active (12/08/2023)   Exercise Vital Sign    Days of Exercise per Week: 1 day    Minutes of Exercise per Session: 30 min  Stress: No Stress Concern Present (12/08/2023)   [employer] of Occupational Health - Occupational Stress Questionnaire    Feeling of Stress: Not at all  Social Connections: Unknown (12/08/2023)   Social Connection and Isolation Panel    Frequency of Communication with Friends and Family: Once a week    Frequency of Social Gatherings with Friends and Family: Patient declined    Attends Religious Services: Patient declined    Database administrator or Organizations: No  Attends Banker Meetings: Not on file    Marital Status: Married  Intimate Partner Violence: Not At Risk (10/22/2023)   Humiliation, Afraid, Rape, and Kick questionnaire    Fear of Current or Ex-Partner: No    Emotionally Abused: No    Physically Abused: No    Sexually Abused: No    FAMILY HISTORY: Family History  Problem Relation Age of Onset   Colon polyps Mother    Hyperlipidemia Father    Colon polyps Sister    Colon polyps Brother    Colon cancer Neg Hx    Esophageal cancer Neg Hx    Rectal cancer Neg Hx    Stomach cancer Neg Hx    Sleep apnea Neg Hx     Review of Systems  Constitutional:  Positive for fatigue. Negative for appetite change, chills, fever and unexpected weight change.  HENT:   Negative for hearing loss, lump/mass and trouble swallowing.   Eyes:  Negative for eye problems and  icterus.  Respiratory:  Negative for chest tightness, cough and shortness of breath.   Cardiovascular:  Negative for chest pain, leg swelling and palpitations.  Gastrointestinal:  Negative for abdominal distention, abdominal pain, constipation, diarrhea, nausea and vomiting.  Endocrine: Negative for hot flashes.  Genitourinary:  Negative for difficulty urinating.   Musculoskeletal:  Negative for arthralgias.  Skin:  Negative for itching and rash.  Neurological:  Negative for dizziness, extremity weakness, headaches and numbness.  Hematological:  Negative for adenopathy. Does not bruise/bleed easily.  Psychiatric/Behavioral:  Negative for depression. The patient is not nervous/anxious.       PHYSICAL EXAMINATION    Vitals:   02/06/24 0900  BP: 110/65  Pulse: 74  Resp: 16  Temp: (!) 97.3 F (36.3 C)  SpO2: 100%    Physical Exam Constitutional:      General: She is not in acute distress.    Appearance: Normal appearance. She is not toxic-appearing.  HENT:     Head: Normocephalic and atraumatic.     Mouth/Throat:     Mouth: Mucous membranes are moist.     Pharynx: Oropharynx is clear. No oropharyngeal exudate or posterior oropharyngeal erythema.  Eyes:     General: No scleral icterus. Cardiovascular:     Rate and Rhythm: Normal rate and regular rhythm.     Pulses: Normal pulses.     Heart sounds: Normal heart sounds.  Pulmonary:     Effort: Pulmonary effort is normal.     Breath sounds: Normal breath sounds.  Abdominal:     General: Abdomen is flat. Bowel sounds are normal. There is no distension.     Palpations: Abdomen is soft.     Tenderness: There is no abdominal tenderness.  Musculoskeletal:        General: No swelling.     Cervical back: Neck supple.  Lymphadenopathy:     Cervical: No cervical adenopathy.  Skin:    General: Skin is warm and dry.     Findings: No rash.  Neurological:     General: No focal deficit present.     Mental Status: She is alert.   Psychiatric:        Mood and Affect: Mood normal.        Behavior: Behavior normal.     LABORATORY DATA:  CBC    Component Value Date/Time   WBC 1.5 (L) 02/06/2024 0830   WBC 5.6 09/17/2022 0830   RBC 3.75 (L) 02/06/2024 0830   HGB 11.1 (L)  02/06/2024 0830   HCT 32.1 (L) 02/06/2024 0830   PLT 166 02/06/2024 0830   MCV 85.6 02/06/2024 0830   MCH 29.6 02/06/2024 0830   MCHC 34.6 02/06/2024 0830   RDW 16.8 (H) 02/06/2024 0830   LYMPHSABS 0.7 02/06/2024 0830   MONOABS 0.3 02/06/2024 0830   EOSABS 0.0 02/06/2024 0830   BASOSABS 0.0 02/06/2024 0830    CMP     Component Value Date/Time   NA 136 02/06/2024 0830   K 3.5 02/06/2024 0830   CL 102 02/06/2024 0830   CO2 27 02/06/2024 0830   GLUCOSE 100 (H) 02/06/2024 0830   BUN 12 02/06/2024 0830   CREATININE 0.50 02/06/2024 0830   CREATININE 0.89 09/05/2020 0857   CALCIUM  8.9 02/06/2024 0830   PROT 6.7 02/06/2024 0830   ALBUMIN 3.9 02/06/2024 0830   AST 10 (L) 02/06/2024 0830   ALT 13 02/06/2024 0830   ALKPHOS 71 02/06/2024 0830   BILITOT 0.4 02/06/2024 0830   GFRNONAA >60 02/06/2024 0830     ASSESSMENT and THERAPY PLAN:   Malignant neoplasm of upper-outer quadrant of right breast in female, estrogen receptor positive (HCC) 10/07/2023:Screening mammogram detected right breast mass at 12:00 measuring 5.1 cm, 1 axillary lymph node biopsy was benign biopsy of the mass grade 3 IDC with necrosis ER 30% weak, PR 0%, Ki67 60%, HER2 negative   Treatment plan: Neoadjuvant chemotherapy with Taxol  carbo Keytruda  followed by Adriamycin  Cytoxan  Keytruda  Breast conserving surgery with sentinel lymph node biopsy Adjuvant radiation therapy Antiestrogen therapy if the final path is estrogen receptor positive CT CAP and bone scan 11/04/2023: 4.7 cm right breast mass, right axillary lymph node 1.8 cm, 0.2 cm lung nodule benign Breast MRI 10/30/2023: Right breast malignancy 4.7 cm, enlarged right axillary lymph  node ------------------------------------------------------------------------------------------------------------------------------------------------ Current treatment: Adriamycin , Cytoxan , Keytruda  C 1 d8  Toxicities: Chemotherapy induced neutropenia: ANC less than 500, prescribed cipro  bid x 7 days, discussed neutropenic precautions in detail.   Fatigue: energy conservation  RTC in 2 weeks for labs, f/u, and treatment  All questions were answered. The patient knows to call the clinic with any problems, questions or concerns. We can certainly see the patient much sooner if necessary.  Total encounter time:20 minutes*in face-to-face visit time, chart review, lab review, care coordination, order entry, and documentation of the encounter time.    Morna Kendall, NP 02/11/24 4:55 PM Medical Oncology and Hematology [facility] [address] [place], [place] [address] Tel. [phone]    Fax. [phone]  *Total Encounter Time as defined by the Centers for Medicare and Medicaid Services includes, in addition to the face-to-face time of a patient visit (documented in the note above) non-face-to-face time: obtaining and reviewing outside history, ordering and reviewing medications, tests or procedures, care coordination (communications with other health care professionals or caregivers) and documentation in the medical record.

## 2024-02-09 ENCOUNTER — Telehealth: Payer: Self-pay

## 2024-02-09 NOTE — Telephone Encounter (Signed)
 Pt called to request letting for [employer]. Per MD, letter provided and sent to pt via MyChart. We also mailed a copy with MD signature for authenticity.

## 2024-02-10 ENCOUNTER — Encounter: Payer: Self-pay | Admitting: Hematology and Oncology

## 2024-02-11 ENCOUNTER — Encounter: Payer: Self-pay | Admitting: Hematology and Oncology

## 2024-02-11 NOTE — Assessment & Plan Note (Signed)
 10/07/2023:Screening mammogram detected right breast mass at 12:00 measuring 5.1 cm, 1 axillary lymph node biopsy was benign biopsy of the mass grade 3 IDC with necrosis ER 30% weak, PR 0%, Ki67 60%, HER2 negative   Treatment plan: Neoadjuvant chemotherapy with Taxol  carbo Keytruda  followed by Adriamycin  Cytoxan  Keytruda  Breast conserving surgery with sentinel lymph node biopsy Adjuvant radiation therapy Antiestrogen therapy if the final path is estrogen receptor positive CT CAP and bone scan 11/04/2023: 4.7 cm right breast mass, right axillary lymph node 1.8 cm, 0.2 cm lung nodule benign Breast MRI 10/30/2023: Right breast malignancy 4.7 cm, enlarged right axillary lymph node ------------------------------------------------------------------------------------------------------------------------------------------------ Current treatment: Adriamycin , Cytoxan , Keytruda  C 1 d8  Toxicities: Chemotherapy induced neutropenia: ANC less than 500, prescribed cipro  bid x 7 days, discussed neutropenic precautions in detail.   Fatigue: energy conservation  RTC in 2 weeks for labs, f/u, and treatment

## 2024-02-12 ENCOUNTER — Other Ambulatory Visit: Payer: Self-pay | Admitting: Hematology and Oncology

## 2024-02-18 ENCOUNTER — Encounter: Payer: Self-pay | Admitting: Hematology and Oncology

## 2024-02-18 MED FILL — Fosaprepitant Dimeglumine For IV Infusion 150 MG (Base Eq): INTRAVENOUS | Qty: 5 | Status: AC

## 2024-02-19 ENCOUNTER — Other Ambulatory Visit (HOSPITAL_COMMUNITY): Payer: Self-pay

## 2024-02-19 ENCOUNTER — Encounter: Payer: Self-pay | Admitting: *Deleted

## 2024-02-19 ENCOUNTER — Inpatient Hospital Stay

## 2024-02-19 ENCOUNTER — Inpatient Hospital Stay (HOSPITAL_BASED_OUTPATIENT_CLINIC_OR_DEPARTMENT_OTHER): Admitting: Adult Health

## 2024-02-19 ENCOUNTER — Encounter: Payer: Self-pay | Admitting: Adult Health

## 2024-02-19 ENCOUNTER — Other Ambulatory Visit: Payer: Self-pay

## 2024-02-19 VITALS — BP 123/66 | HR 90 | Temp 98.1°F | Resp 16 | Wt 176.1 lb

## 2024-02-19 DIAGNOSIS — E876 Hypokalemia: Secondary | ICD-10-CM

## 2024-02-19 DIAGNOSIS — C50411 Malignant neoplasm of upper-outer quadrant of right female breast: Secondary | ICD-10-CM

## 2024-02-19 DIAGNOSIS — Z5112 Encounter for antineoplastic immunotherapy: Secondary | ICD-10-CM | POA: Diagnosis not present

## 2024-02-19 DIAGNOSIS — Z170 Estrogen receptor positive status [ER+]: Secondary | ICD-10-CM | POA: Diagnosis not present

## 2024-02-19 LAB — CBC WITH DIFFERENTIAL (CANCER CENTER ONLY)
Abs Immature Granulocytes: 0.03 K/uL (ref 0.00–0.07)
Basophils Absolute: 0 K/uL (ref 0.0–0.1)
Basophils Relative: 1 %
Eosinophils Absolute: 0 K/uL (ref 0.0–0.5)
Eosinophils Relative: 0 %
HCT: 31.6 % — ABNORMAL LOW (ref 36.0–46.0)
Hemoglobin: 10.6 g/dL — ABNORMAL LOW (ref 12.0–15.0)
Immature Granulocytes: 1 %
Lymphocytes Relative: 22 %
Lymphs Abs: 1.1 K/uL (ref 0.7–4.0)
MCH: 29.4 pg (ref 26.0–34.0)
MCHC: 33.5 g/dL (ref 30.0–36.0)
MCV: 87.8 fL (ref 80.0–100.0)
Monocytes Absolute: 0.9 K/uL (ref 0.1–1.0)
Monocytes Relative: 18 %
Neutro Abs: 2.9 K/uL (ref 1.7–7.7)
Neutrophils Relative %: 58 %
Platelet Count: 390 K/uL (ref 150–400)
RBC: 3.6 MIL/uL — ABNORMAL LOW (ref 3.87–5.11)
RDW: 16.8 % — ABNORMAL HIGH (ref 11.5–15.5)
WBC Count: 4.8 K/uL (ref 4.0–10.5)
nRBC: 0 % (ref 0.0–0.2)

## 2024-02-19 LAB — CMP (CANCER CENTER ONLY)
ALT: 16 U/L (ref 0–44)
AST: 14 U/L — ABNORMAL LOW (ref 15–41)
Albumin: 3.9 g/dL (ref 3.5–5.0)
Alkaline Phosphatase: 73 U/L (ref 38–126)
Anion gap: 6 (ref 5–15)
BUN: 11 mg/dL (ref 6–20)
CO2: 29 mmol/L (ref 22–32)
Calcium: 9.1 mg/dL (ref 8.9–10.3)
Chloride: 105 mmol/L (ref 98–111)
Creatinine: 0.57 mg/dL (ref 0.44–1.00)
GFR, Estimated: >60 mL/min (ref >60)
Glucose, Bld: 110 mg/dL — ABNORMAL HIGH (ref 70–99)
Potassium: 3 mmol/L — ABNORMAL LOW (ref 3.5–5.1)
Sodium: 140 mmol/L (ref 135–145)
Total Bilirubin: 0.3 mg/dL (ref 0.0–1.2)
Total Protein: 6.7 g/dL (ref 6.5–8.1)

## 2024-02-19 LAB — TSH: TSH: 0.266 u[IU]/mL — ABNORMAL LOW (ref 0.350–4.500)

## 2024-02-19 MED ORDER — SODIUM CHLORIDE 0.9 % IV SOLN
200.0000 mg | Freq: Once | INTRAVENOUS | Status: AC
  Administered 2024-02-19: 200 mg via INTRAVENOUS
  Filled 2024-02-19: qty 200

## 2024-02-19 MED ORDER — SODIUM CHLORIDE 0.9 % IV SOLN: INTRAVENOUS | Status: DC

## 2024-02-19 MED ORDER — SODIUM CHLORIDE 0.9 % IV SOLN
150.0000 mg | Freq: Once | INTRAVENOUS | Status: AC
  Administered 2024-02-19: 150 mg via INTRAVENOUS
  Filled 2024-02-19: qty 150

## 2024-02-19 MED ORDER — AMOXICILLIN-POT CLAVULANATE 875-125 MG PO TABS
1.0000 | ORAL_TABLET | Freq: Two times a day (BID) | ORAL | 0 refills | Status: DC
  Filled 2024-02-19 (×2): qty 14, 7d supply, fill #0

## 2024-02-19 MED ORDER — SODIUM CHLORIDE 0.9 % IV SOLN
600.0000 mg/m2 | Freq: Once | INTRAVENOUS | Status: AC
  Administered 2024-02-19: 1120 mg via INTRAVENOUS
  Filled 2024-02-19: qty 56

## 2024-02-19 MED ORDER — DEXAMETHASONE SODIUM PHOSPHATE 10 MG/ML IJ SOLN
6.0000 mg | Freq: Once | INTRAMUSCULAR | Status: AC
  Administered 2024-02-19: 6 mg via INTRAVENOUS
  Filled 2024-02-19: qty 1

## 2024-02-19 MED ORDER — PALONOSETRON HCL INJECTION 0.25 MG/5ML
0.2500 mg | Freq: Once | INTRAVENOUS | Status: AC
  Administered 2024-02-19: 0.25 mg via INTRAVENOUS
  Filled 2024-02-19: qty 5

## 2024-02-19 MED ORDER — DOXORUBICIN HCL CHEMO IV INJECTION 2 MG/ML
60.0000 mg/m2 | Freq: Once | INTRAVENOUS | Status: AC
  Administered 2024-02-19: 112 mg via INTRAVENOUS
  Filled 2024-02-19: qty 56

## 2024-02-19 MED ORDER — SODIUM CHLORIDE 0.9% FLUSH: 10.0000 mL | INTRAVENOUS | Status: DC | PRN

## 2024-02-19 MED ORDER — PEGFILGRASTIM 6 MG/0.6ML ~~LOC~~ PSKT
6.0000 mg | PREFILLED_SYRINGE | Freq: Once | SUBCUTANEOUS | Status: AC
  Administered 2024-02-19: 6 mg via SUBCUTANEOUS
  Filled 2024-02-19: qty 0.6

## 2024-02-19 MED ORDER — POTASSIUM CHLORIDE ER 10 MEQ PO CPCR
ORAL_CAPSULE | ORAL | 0 refills | Status: DC
  Filled 2024-02-19: qty 45, 30d supply, fill #0
  Filled 2024-02-19: qty 45, 38d supply, fill #0

## 2024-02-19 NOTE — Progress Notes (Signed)
 [facility] Cancer Follow up:    Gabrielle Shasta NOVAK, NP [address] [place] [address]   DIAGNOSIS:  Cancer Staging  Malignant neoplasm of upper-outer quadrant of right breast in female, estrogen receptor positive (HCC) Staging form: Breast, AJCC 8th Edition - Clinical stage from 10/22/2023: Stage IIIA (cT3, cN0, cM0, G3, ER+, PR-, HER2-) - Signed by Odean Potts, MD on 10/22/2023 Stage prefix: Initial diagnosis Histologic grading system: 3 grade system Laterality: Right Staged by: Pathologist and managing physician Stage used in treatment planning: Yes National guidelines used in treatment planning: Yes Type of national guideline used in treatment planning: NCCN    SUMMARY OF ONCOLOGIC HISTORY: Oncology History  Malignant neoplasm of upper-outer quadrant of right breast in female, estrogen receptor positive (HCC)  10/07/2023 Initial Diagnosis   Screening mammogram detected right breast mass at 12:00 measuring 5.1 cm, 1 axillary lymph node biopsy was benign biopsy of the mass grade 3 IDC with necrosis ER 30% weak, PR 0%, Ki67 60%, HER2 negative   10/22/2023 Cancer Staging   Staging form: Breast, AJCC 8th Edition - Clinical stage from 10/22/2023: Stage IIIA (cT3, cN0, cM0, G3, ER+, PR-, HER2-) - Signed by Odean Potts, MD on 10/22/2023 Stage prefix: Initial diagnosis Histologic grading system: 3 grade system Laterality: Right Staged by: Pathologist and managing physician Stage used in treatment planning: Yes National guidelines used in treatment planning: Yes Type of national guideline used in treatment planning: NCCN   11/02/2023 Genetic Testing   Negative [facility] CancerNext +RNAinsight Panel.  VUS in BARD1 at p.K438E (c.1312A>G).  Report date is 11/02/2023.   The [facility] CancerNext+RNAinsight Panel includes sequencing, rearrangement analysis, and RNA analysis for the following 40 genes: APC, ATM, BAP1, BARD1, BMPR1A, BRCA1, BRCA2, BRIP1, CDH1, CDKN2A,  CHEK2, FH, FLCN, MET, MLH1, MSH2, MSH6, MUTYH, NF1, NTHL1, PALB2, PMS2, PTEN, RAD51C, RAD51D, RSP20, SMAD4, STK11, TP53, TSC1, TSC2, and VHL (sequencing and deletion/duplication); AXIN2, HOXB13, MBD4, MSH3, POLD1 and POLE (sequencing only); EPCAM and GREM1 (deletion/duplication only).   11/06/2023 -  Chemotherapy   Patient is on Treatment Plan : BREAST Pembrolizumab  (200) D1 + Carboplatin  (1.5) D1,8,15 + Paclitaxel  (80) D1,8,15 q21d X 4 cycles / Pembrolizumab  (200) D1 + AC D1 q21d x 4 cycles       CURRENT THERAPY: adriamycin /cytoxan /keytruda   INTERVAL HISTORY: Discussed the use of AI scribe software for clinical note transcription with the patient, who gave verbal consent to proceed.  History of Present Illness Gabrielle Cross is a 58 year old female undergoing chemotherapy who presents for follow-up and evaluation.  She is receiving neoadjuvant chemotherapy with Adriamycin , Cytoxan , and Keytruda  every three weeks for four cycles.  This visit is for cycle two of her current regimen. She continues to experinece peripheral neuropathy .  Her potassium level is at the lower limit, and she is taking a diuretic for blood pressure management. She finds it challenging to maintain a diet high in potassium due to taste changes from chemotherapy and difficulty eating three meals a day.  Her activity level is minimal, but she manages to walk around her house every hour while working from home. She finds it difficult to engage in longer walks due to the chemotherapy regimen.  She experiences discomfort at the site of her port, describing it as a 'nagging discomfort' rather than pain. There is concern about the port feeling like it wants to come out, but there is no redness or severe pain associated with it.  She has developed an  infected hair follicle, which is painful and red. She previously had a similar issue on the opposite side, which was treated with antibiotics. She is using a topical gel she already  has at home for treatment.     Patient Active Problem List   Diagnosis Date Noted   Chemotherapy induced neutropenia (HCC) 02/06/2024   Acute folliculitis 12/09/2023   Chronic hyperglycemia 12/09/2023   Diuretic-induced hypokalemia 12/09/2023   Diabetes mellitus (HCC) 12/09/2023   Port-A-Cath in place 11/13/2023   Genetic testing 11/04/2023   Malignant neoplasm of upper-outer quadrant of right breast in female, estrogen receptor positive (HCC) 10/20/2023   Dyslipidemia, goal LDL below 130 09/17/2022   Acne rosacea 08/23/2022   Inflamed seborrheic keratosis 08/23/2022   Keratosis pilaris 08/23/2022   Vitamin D  deficiency 04/19/2021   Loud snoring 04/18/2021   Primary hypertension 04/18/2021   Encounter for general adult medical examination with abnormal findings 04/18/2021   Visit for screening mammogram 04/18/2021   Erythrocytosis 02/22/2021   Squamous cell cancer of skin of left cheek 03/24/2019   Migraines 01/09/2012    is allergic to shellfish allergy.  MEDICAL HISTORY: Past Medical History:  Diagnosis Date   Arthritis    knees   Blood transfusion without reported diagnosis 2002   Carpal tunnel syndrome, bilateral    Elevated hemoglobin (HCC)    Hypertension    Incompetent cervix    DELIVERED AT 31 WEEKS   Migraines    OSA (obstructive sleep apnea)    Rx for autopap, does not use   Ruptured ectopic pregnancy 2002   WITH BLOOD TRANSFUSION 2U'S PRBC'S   Wears glasses     SURGICAL HISTORY: Past Surgical History:  Procedure Laterality Date   BREAST BIOPSY Right 10/07/2023   US  RT BREAST BX W LOC DEV 1ST LESION IMG BX SPEC US  GUIDE 10/07/2023 [facility] MAMMOGRAPHY   CERCLAGE PLACEMENT     DILATATION & CURETTAGE/HYSTEROSCOPY WITH MYOSURE N/A 04/19/2022   Procedure: DILATATION & CURETTAGE/HYSTEROSCOPY WITH MYOSURE;  Surgeon: Lavoie, Marie-Lyne, MD;  Location: [facility];  Service: Gynecology;  Laterality: N/A;   IR IMAGING GUIDED PORT INSERTION  11/10/2023    PORTACATH PLACEMENT Left 11/05/2023   Procedure: ATTEMPTED INSERTION, TUNNELED CENTRAL VENOUS DEVICE, WITH PORT;  Surgeon: Curvin Deward MOULD, MD;  Location: [facility];  Service: General;  Laterality: Left;  PORT PLACEMENT WITH ULTRASOUND GUIDANCE   RESECTOSCOPIC POLYPECTOMY  2001   SALPINGECTOMY Right    RIGHT, RUPTURED ECTOPIC   SKIN CANCER EXCISION  2020   tangential bx  02/22/2019    SOCIAL HISTORY: Social History   Socioeconomic History   Marital status: Married    Spouse name: Not on file   Number of children: Not on file   Years of education: Not on file   Highest education level: Not on file  Occupational History   Not on file  Tobacco Use   Smoking status: Never    Passive exposure: Never   Smokeless tobacco: Never  Vaping Use   Vaping status: Never Used  Substance and Sexual Activity   Alcohol use: Yes    Comment: weekends - social    Drug use: No   Sexual activity: Yes    Partners: Male    Birth control/protection: Post-menopausal    Comment: intercoure age 67, less than 5 sexual partners, des neg  Other Topics Concern   Not on file  Social History Narrative   Not on file   Social Drivers of Health  Financial Resource Strain: Low Risk  (12/08/2023)   Overall Financial Resource Strain (CARDIA)    Difficulty of Paying Living Expenses: Not hard at all  Food Insecurity: No Food Insecurity (12/08/2023)   Hunger Vital Sign    Worried About Running Out of Food in the Last Year: Never true    Ran Out of Food in the Last Year: Never true  Transportation Needs: No Transportation Needs (12/08/2023)   PRAPARE - Administrator, Civil Service (Medical): No    Lack of Transportation (Non-Medical): No  Physical Activity: Insufficiently Active (12/08/2023)   Exercise Vital Sign    Days of Exercise per Week: 1 day    Minutes of Exercise per Session: 30 min  Stress: No Stress Concern Present (12/08/2023)   [employer] of Occupational Health -  Occupational Stress Questionnaire    Feeling of Stress: Not at all  Social Connections: Unknown (12/08/2023)   Social Connection and Isolation Panel    Frequency of Communication with Friends and Family: Once a week    Frequency of Social Gatherings with Friends and Family: Patient declined    Attends Religious Services: Patient declined    Database administrator or Organizations: No    Attends Engineer, structural: Not on file    Marital Status: Married  Catering manager Violence: Not At Risk (10/22/2023)   Humiliation, Afraid, Rape, and Kick questionnaire    Fear of Current or Ex-Partner: No    Emotionally Abused: No    Physically Abused: No    Sexually Abused: No    FAMILY HISTORY: Family History  Problem Relation Age of Onset   Colon polyps Mother    Hyperlipidemia Father    Colon polyps Sister    Colon polyps Brother    Colon cancer Neg Hx    Esophageal cancer Neg Hx    Rectal cancer Neg Hx    Stomach cancer Neg Hx    Sleep apnea Neg Hx     Review of Systems  Constitutional:  Positive for fatigue. Negative for appetite change, chills, fever and unexpected weight change.  HENT:   Negative for hearing loss, lump/mass and trouble swallowing.   Eyes:  Negative for eye problems and icterus.  Respiratory:  Negative for chest tightness, cough and shortness of breath.   Cardiovascular:  Negative for chest pain, leg swelling and palpitations.  Gastrointestinal:  Negative for abdominal distention, abdominal pain, constipation, diarrhea, nausea and vomiting.  Endocrine: Negative for hot flashes.  Genitourinary:  Negative for difficulty urinating.   Musculoskeletal:  Negative for arthralgias.  Skin:  Negative for itching and rash.  Neurological:  Positive for numbness. Negative for dizziness, extremity weakness and headaches.  Hematological:  Negative for adenopathy. Does not bruise/bleed easily.  Psychiatric/Behavioral:  Negative for depression. The patient is not  nervous/anxious.       PHYSICAL EXAMINATION   Onc Performance Status - 02/19/24 0936       ECOG Perf Status   ECOG Perf Status Fully active, able to carry on all pre-disease performance without restriction      KPS SCALE   KPS % SCORE Normal, no compliants, no evidence of disease          Vitals:   02/19/24 0920  BP: 123/66  Pulse: 90  Resp: 16  Temp: 98.1 F (36.7 C)  SpO2: 96%    Physical Exam Constitutional:      General: She is not in acute distress.    Appearance: Normal  appearance. She is not toxic-appearing.  HENT:     Head: Normocephalic and atraumatic.     Mouth/Throat:     Mouth: Mucous membranes are moist.     Pharynx: Oropharynx is clear. No oropharyngeal exudate or posterior oropharyngeal erythema.  Eyes:     General: No scleral icterus. Cardiovascular:     Rate and Rhythm: Normal rate and regular rhythm.     Pulses: Normal pulses.     Heart sounds: Normal heart sounds.  Pulmonary:     Effort: Pulmonary effort is normal.     Breath sounds: Normal breath sounds.  Abdominal:     General: Abdomen is flat. Bowel sounds are normal. There is no distension.     Palpations: Abdomen is soft.     Tenderness: There is no abdominal tenderness.  Musculoskeletal:        General: No swelling.     Cervical back: Neck supple.  Lymphadenopathy:     Cervical: No cervical adenopathy.  Skin:    General: Skin is warm and dry.     Findings: No rash.  Neurological:     General: No focal deficit present.     Mental Status: She is alert.  Psychiatric:        Mood and Affect: Mood normal.        Behavior: Behavior normal.     LABORATORY DATA:  CBC    Component Value Date/Time   WBC 4.8 02/19/2024 0858   WBC 5.6 09/17/2022 0830   RBC 3.60 (L) 02/19/2024 0858   HGB 10.6 (L) 02/19/2024 0858   HCT 31.6 (L) 02/19/2024 0858   PLT 390 02/19/2024 0858   MCV 87.8 02/19/2024 0858   MCH 29.4 02/19/2024 0858   MCHC 33.5 02/19/2024 0858   RDW 16.8 (H) 02/19/2024  0858   LYMPHSABS 1.1 02/19/2024 0858   MONOABS 0.9 02/19/2024 0858   EOSABS 0.0 02/19/2024 0858   BASOSABS 0.0 02/19/2024 0858    CMP     Component Value Date/Time   NA 140 02/19/2024 0858   K 3.0 (L) 02/19/2024 0858   CL 105 02/19/2024 0858   CO2 29 02/19/2024 0858   GLUCOSE 110 (H) 02/19/2024 0858   BUN 11 02/19/2024 0858   CREATININE 0.57 02/19/2024 0858   CREATININE 0.89 09/05/2020 0857   CALCIUM  9.1 02/19/2024 0858   PROT 6.7 02/19/2024 0858   ALBUMIN 3.9 02/19/2024 0858   AST 14 (L) 02/19/2024 0858   ALT 16 02/19/2024 0858   ALKPHOS 73 02/19/2024 0858   BILITOT 0.3 02/19/2024 0858   GFRNONAA >60 02/19/2024 0858     ASSESSMENT and THERAPY PLAN:   Malignant neoplasm of upper-outer quadrant of right breast in female, estrogen receptor positive (HCC) 10/07/2023:Screening mammogram detected right breast mass at 12:00 measuring 5.1 cm, 1 axillary lymph node biopsy was benign biopsy of the mass grade 3 IDC with necrosis ER 30% weak, PR 0%, Ki67 60%, HER2 negative   Treatment plan: Neoadjuvant chemotherapy with Taxol  carbo Keytruda  followed by Adriamycin  Cytoxan  Keytruda  Breast conserving surgery with sentinel lymph node biopsy Adjuvant radiation therapy Antiestrogen therapy if the final path is estrogen receptor positive CT CAP and bone scan 11/04/2023: 4.7 cm right breast mass, right axillary lymph node 1.8 cm, 0.2 cm lung nodule benign Breast MRI 10/30/2023: Right breast malignancy 4.7 cm, enlarged right axillary lymph node ------------------------------------------------------------------------------------------------------------------------------------------------ Current treatment: Adriamycin , Cytoxan , Keytruda  C 2 d1 Assessment and Plan Assessment & Plan Malignant neoplasm of upper-outer quadrant of right breast Undergoing neoadjuvant  chemotherapy with Adriamycin , Cytoxan , and Keytruda . Treatment progressing as expected without significant complications. - Continue  neoadjuvant chemotherapy with Adriamycin , Cytoxan , and Keytruda  as scheduled.  Surgical planning post-chemotherapy Surgery anticipated for the first week of December post-chemotherapy and evaluations. - Plan for surgery in the first week of December after completing chemotherapy and evaluations.  Chemotherapy-induced anemia Mild decrease in hemoglobin levels, expected with chemotherapy. Anemia not severe enough to require a blood transfusion. Fatigue likely due to chemotherapy rather than anemia.  Chemotherapy-induced hypokalemia Potassium level at lower limit (3.0 mmol/L), likely due to diuretic use for hypertension management. - Prescribe potassium supplementation: one tablet twice a day for a week, then one tablet daily. - Provide dietary guidance on potassium-rich foods.  Chemotherapy port discomfort Port causing discomfort, likely due to foreign body sensation. No redness or signs of infection. - Consider a dye study if discomfort worsens. - Discuss the option of removing the port after Adriamycin  treatment if discomfort persists.  Infected hair follicle (folliculitis) Painful, red area suggestive of an infected hair follicle. Previous antibiotics were not effective. - Advise warm compresses for symptomatic relief. - Use existing topical gel.   All questions were answered. The patient knows to call the clinic with any problems, questions or concerns. We can certainly see the patient much sooner if necessary.  Total encounter time:40 minutes*in face-to-face visit time, chart review, lab review, care coordination, order entry, and documentation of the encounter time.    Morna Kendall, NP 02/19/24 9:45 AM Medical Oncology and Hematology [facility] [address] [place], [place] [address] Tel. [phone]    Fax. [phone]  *Total Encounter Time as defined by the Centers for Medicare and Medicaid Services includes, in addition to the face-to-face time of a  patient visit (documented in the note above) non-face-to-face time: obtaining and reviewing outside history, ordering and reviewing medications, tests or procedures, care coordination (communications with other health care professionals or caregivers) and documentation in the medical record.

## 2024-02-19 NOTE — Patient Instructions (Signed)
 [facility] - A DEPT OF [facility]  Discharge Instructions: Thank you for choosing [facility] to provide your oncology and hematology care.   If you have a lab appointment with the Cancer Center, please go directly to the Cancer Center and check in at the registration area.   Wear comfortable clothing and clothing appropriate for easy access to any Portacath or PICC line.   We strive to give you quality time with your provider. You may need to reschedule your appointment if you arrive late (15 or more minutes).  Arriving late affects you and other patients whose appointments are after yours.  Also, if you miss three or more appointments without notifying the office, you may be dismissed from the clinic at the provider's discretion.      For prescription refill requests, have your pharmacy contact our office and allow 72 hours for refills to be completed.    Today you received the following chemotherapy and/or immunotherapy agents keytruda , doxorubicin , cytoxan       To help prevent nausea and vomiting after your treatment, we encourage you to take your nausea medication as directed.  BELOW ARE SYMPTOMS THAT SHOULD BE REPORTED IMMEDIATELY: *FEVER GREATER THAN 100.4 F (38 C) OR HIGHER *CHILLS OR SWEATING *NAUSEA AND VOMITING THAT IS NOT CONTROLLED WITH YOUR NAUSEA MEDICATION *UNUSUAL SHORTNESS OF BREATH *UNUSUAL BRUISING OR BLEEDING *URINARY PROBLEMS (pain or burning when urinating, or frequent urination) *BOWEL PROBLEMS (unusual diarrhea, constipation, pain near the anus) TENDERNESS IN MOUTH AND THROAT WITH OR WITHOUT PRESENCE OF ULCERS (sore throat, sores in mouth, or a toothache) UNUSUAL RASH, SWELLING OR PAIN  UNUSUAL VAGINAL DISCHARGE OR ITCHING   Items with * indicate a potential emergency and should be followed up as soon as possible or go to the Emergency Department if any problems should occur.  Please show the CHEMOTHERAPY ALERT  CARD or IMMUNOTHERAPY ALERT CARD at check-in to the Emergency Department and triage nurse.  Should you have questions after your visit or need to cancel or reschedule your appointment, please contact [facility] - A DEPT OF [facility]  Dept: [phone]  and follow the prompts.  Office hours are 8:00 a.m. to 4:30 p.m. Monday - Friday. Please note that voicemails left after 4:00 p.m. may not be returned until the following business day.  We are closed weekends and major holidays. You have access to a nurse at all times for urgent questions. Please call the main number to the clinic Dept: [phone] and follow the prompts.   For any non-urgent questions, you may also contact your provider using MyChart. We now offer e-Visits for anyone 86 and older to request care online for non-urgent symptoms. For details visit [email or web address].   Also download the MyChart app! Go to the app store, search MyChart, open the app, select , and log in with your MyChart username and password.

## 2024-02-19 NOTE — Assessment & Plan Note (Signed)
 10/07/2023:Screening mammogram detected right breast mass at 12:00 measuring 5.1 cm, 1 axillary lymph node biopsy was benign biopsy of the mass grade 3 IDC with necrosis ER 30% weak, PR 0%, Ki67 60%, HER2 negative   Treatment plan: Neoadjuvant chemotherapy with Taxol  carbo Keytruda  followed by Adriamycin  Cytoxan  Keytruda  Breast conserving surgery with sentinel lymph node biopsy Adjuvant radiation therapy Antiestrogen therapy if the final path is estrogen receptor positive CT CAP and bone scan 11/04/2023: 4.7 cm right breast mass, right axillary lymph node 1.8 cm, 0.2 cm lung nodule benign Breast MRI 10/30/2023: Right breast malignancy 4.7 cm, enlarged right axillary lymph node ------------------------------------------------------------------------------------------------------------------------------------------------ Current treatment: Adriamycin , Cytoxan , Keytruda  C 2 d1

## 2024-02-20 LAB — T4: T4, Total: 5.5 ug/dL (ref 4.5–12.0)

## 2024-02-21 ENCOUNTER — Ambulatory Visit

## 2024-02-25 ENCOUNTER — Encounter: Payer: Self-pay | Admitting: Hematology and Oncology

## 2024-03-09 MED FILL — Fosaprepitant Dimeglumine For IV Infusion 150 MG (Base Eq): INTRAVENOUS | Qty: 5 | Status: AC

## 2024-03-10 ENCOUNTER — Other Ambulatory Visit: Payer: Self-pay

## 2024-03-10 ENCOUNTER — Inpatient Hospital Stay: Attending: Hematology and Oncology

## 2024-03-10 ENCOUNTER — Other Ambulatory Visit (HOSPITAL_COMMUNITY): Payer: Self-pay

## 2024-03-10 ENCOUNTER — Inpatient Hospital Stay

## 2024-03-10 ENCOUNTER — Inpatient Hospital Stay (HOSPITAL_BASED_OUTPATIENT_CLINIC_OR_DEPARTMENT_OTHER): Admitting: Hematology and Oncology

## 2024-03-10 ENCOUNTER — Encounter: Payer: Self-pay | Admitting: *Deleted

## 2024-03-10 VITALS — BP 112/68 | HR 80 | Temp 98.6°F | Resp 18 | Ht 60.0 in | Wt 171.7 lb

## 2024-03-10 DIAGNOSIS — C50411 Malignant neoplasm of upper-outer quadrant of right female breast: Secondary | ICD-10-CM

## 2024-03-10 DIAGNOSIS — Z5112 Encounter for antineoplastic immunotherapy: Secondary | ICD-10-CM | POA: Diagnosis present

## 2024-03-10 DIAGNOSIS — Z1722 Progesterone receptor negative status: Secondary | ICD-10-CM | POA: Diagnosis not present

## 2024-03-10 DIAGNOSIS — Z79899 Other long term (current) drug therapy: Secondary | ICD-10-CM | POA: Insufficient documentation

## 2024-03-10 DIAGNOSIS — Z170 Estrogen receptor positive status [ER+]: Secondary | ICD-10-CM

## 2024-03-10 DIAGNOSIS — Z23 Encounter for immunization: Secondary | ICD-10-CM | POA: Insufficient documentation

## 2024-03-10 DIAGNOSIS — Z5111 Encounter for antineoplastic chemotherapy: Secondary | ICD-10-CM | POA: Insufficient documentation

## 2024-03-10 DIAGNOSIS — Z5189 Encounter for other specified aftercare: Secondary | ICD-10-CM | POA: Diagnosis not present

## 2024-03-10 DIAGNOSIS — Z1732 Human epidermal growth factor receptor 2 negative status: Secondary | ICD-10-CM | POA: Insufficient documentation

## 2024-03-10 LAB — CMP (CANCER CENTER ONLY)
ALT: 16 U/L (ref 0–44)
AST: 15 U/L (ref 15–41)
Albumin: 4 g/dL (ref 3.5–5.0)
Alkaline Phosphatase: 76 U/L (ref 38–126)
Anion gap: 7 (ref 5–15)
BUN: 13 mg/dL (ref 6–20)
CO2: 28 mmol/L (ref 22–32)
Calcium: 9.8 mg/dL (ref 8.9–10.3)
Chloride: 103 mmol/L (ref 98–111)
Creatinine: 0.62 mg/dL (ref 0.44–1.00)
GFR, Estimated: >60 mL/min (ref >60)
Glucose, Bld: 105 mg/dL — ABNORMAL HIGH (ref 70–99)
Potassium: 3.2 mmol/L — ABNORMAL LOW (ref 3.5–5.1)
Sodium: 138 mmol/L (ref 135–145)
Total Bilirubin: 0.3 mg/dL (ref 0.0–1.2)
Total Protein: 7.1 g/dL (ref 6.5–8.1)

## 2024-03-10 LAB — CBC WITH DIFFERENTIAL (CANCER CENTER ONLY)
Abs Immature Granulocytes: 0.05 K/uL (ref 0.00–0.07)
Basophils Absolute: 0 K/uL (ref 0.0–0.1)
Basophils Relative: 1 %
Eosinophils Absolute: 0 K/uL (ref 0.0–0.5)
Eosinophils Relative: 0 %
HCT: 31.8 % — ABNORMAL LOW (ref 36.0–46.0)
Hemoglobin: 10.6 g/dL — ABNORMAL LOW (ref 12.0–15.0)
Immature Granulocytes: 1 %
Lymphocytes Relative: 18 %
Lymphs Abs: 0.9 K/uL (ref 0.7–4.0)
MCH: 29.6 pg (ref 26.0–34.0)
MCHC: 33.3 g/dL (ref 30.0–36.0)
MCV: 88.8 fL (ref 80.0–100.0)
Monocytes Absolute: 0.9 K/uL (ref 0.1–1.0)
Monocytes Relative: 17 %
Neutro Abs: 3.2 K/uL (ref 1.7–7.7)
Neutrophils Relative %: 63 %
Platelet Count: 462 K/uL — ABNORMAL HIGH (ref 150–400)
RBC: 3.58 MIL/uL — ABNORMAL LOW (ref 3.87–5.11)
RDW: 16 % — ABNORMAL HIGH (ref 11.5–15.5)
WBC Count: 5.1 K/uL (ref 4.0–10.5)
nRBC: 0 % (ref 0.0–0.2)

## 2024-03-10 MED ORDER — DOXORUBICIN HCL CHEMO IV INJECTION 2 MG/ML
60.0000 mg/m2 | Freq: Once | INTRAVENOUS | Status: AC
  Administered 2024-03-10: 112 mg via INTRAVENOUS
  Filled 2024-03-10: qty 56

## 2024-03-10 MED ORDER — SULFAMETHOXAZOLE-TRIMETHOPRIM 800-160 MG PO TABS
1.0000 | ORAL_TABLET | Freq: Two times a day (BID) | ORAL | 1 refills | Status: DC
  Filled 2024-03-10 (×2): qty 30, 15d supply, fill #0

## 2024-03-10 MED ORDER — SODIUM CHLORIDE 0.9 % IV SOLN: INTRAVENOUS | Status: DC

## 2024-03-10 MED ORDER — SODIUM CHLORIDE 0.9 % IV SOLN
200.0000 mg | Freq: Once | INTRAVENOUS | Status: AC
  Administered 2024-03-10: 200 mg via INTRAVENOUS
  Filled 2024-03-10: qty 200

## 2024-03-10 MED ORDER — PEGFILGRASTIM 6 MG/0.6ML ~~LOC~~ PSKT
6.0000 mg | PREFILLED_SYRINGE | Freq: Once | SUBCUTANEOUS | Status: AC
  Administered 2024-03-10: 6 mg via SUBCUTANEOUS
  Filled 2024-03-10: qty 0.6

## 2024-03-10 MED ORDER — INFLUENZA VIRUS VACC SPLIT PF (FLUZONE) 0.5 ML IM SUSY
0.5000 mL | PREFILLED_SYRINGE | Freq: Once | INTRAMUSCULAR | Status: AC
  Administered 2024-03-10: 0.5 mL via INTRAMUSCULAR
  Filled 2024-03-10: qty 0.5

## 2024-03-10 MED ORDER — SODIUM CHLORIDE 0.9 % IV SOLN
600.0000 mg/m2 | Freq: Once | INTRAVENOUS | Status: AC
  Administered 2024-03-10: 1120 mg via INTRAVENOUS
  Filled 2024-03-10: qty 56

## 2024-03-10 MED ORDER — DEXAMETHASONE SODIUM PHOSPHATE 10 MG/ML IJ SOLN
6.0000 mg | Freq: Once | INTRAMUSCULAR | Status: AC
  Administered 2024-03-10: 6 mg via INTRAVENOUS
  Filled 2024-03-10: qty 1

## 2024-03-10 MED ORDER — SODIUM CHLORIDE 0.9 % IV SOLN
150.0000 mg | Freq: Once | INTRAVENOUS | Status: AC
  Administered 2024-03-10: 150 mg via INTRAVENOUS
  Filled 2024-03-10: qty 150

## 2024-03-10 MED ORDER — PALONOSETRON HCL INJECTION 0.25 MG/5ML
0.2500 mg | Freq: Once | INTRAVENOUS | Status: AC
  Administered 2024-03-10: 0.25 mg via INTRAVENOUS
  Filled 2024-03-10: qty 5

## 2024-03-10 NOTE — Progress Notes (Signed)
 Patient Care Team: [name], NP as PCP - General (Obstetrics and Gynecology) Lavoie, Marie-Lyne, MD as Consulting Physician (Obstetrics and Gynecology) Odean Potts, MD as Consulting Physician (Hematology and Oncology) Tyree Nanetta SAILOR, RN as Oncology Nurse Navigator Curvin Deward MOULD, MD as Consulting Physician (General Surgery) Izell Domino, MD as Attending Physician (Radiation Oncology)  DIAGNOSIS:  Encounter Diagnosis  Name Primary?   Malignant neoplasm of upper-outer quadrant of right breast in female, estrogen receptor positive (HCC) Yes    SUMMARY OF ONCOLOGIC HISTORY: Oncology History  Malignant neoplasm of upper-outer quadrant of right breast in female, estrogen receptor positive (HCC)  10/07/2023 Initial Diagnosis   Screening mammogram detected right breast mass at 12:00 measuring 5.1 cm, 1 axillary lymph node biopsy was benign biopsy of the mass grade 3 IDC with necrosis ER 30% weak, PR 0%, Ki67 60%, HER2 negative   10/22/2023 Cancer Staging   Staging form: Breast, AJCC 8th Edition - Clinical stage from 10/22/2023: Stage IIIA (cT3, cN0, cM0, G3, ER+, PR-, HER2-) - Signed by Odean Potts, MD on 10/22/2023 Stage prefix: Initial diagnosis Histologic grading system: 3 grade system Laterality: Right Staged by: Pathologist and managing physician Stage used in treatment planning: Yes National guidelines used in treatment planning: Yes Type of national guideline used in treatment planning: NCCN   11/02/2023 Genetic Testing   Negative [facility] CancerNext +RNAinsight Panel.  VUS in BARD1 at p.K438E (c.1312A>G).  Report date is 11/02/2023.   The [facility] CancerNext+RNAinsight Panel includes sequencing, rearrangement analysis, and RNA analysis for the following 40 genes: APC, ATM, BAP1, BARD1, BMPR1A, BRCA1, BRCA2, BRIP1, CDH1, CDKN2A, CHEK2, FH, FLCN, MET, MLH1, MSH2, MSH6, MUTYH, NF1, NTHL1, PALB2, PMS2, PTEN, RAD51C, RAD51D, RSP20, SMAD4, STK11, TP53, TSC1, TSC2, and VHL  (sequencing and deletion/duplication); AXIN2, HOXB13, MBD4, MSH3, POLD1 and POLE (sequencing only); EPCAM and GREM1 (deletion/duplication only).   11/06/2023 -  Chemotherapy   Patient is on Treatment Plan : BREAST Pembrolizumab  (200) D1 + Carboplatin  (1.5) D1,8,15 + Paclitaxel  (80) D1,8,15 q21d X 4 cycles / Pembrolizumab  (200) D1 + AC D1 q21d x 4 cycles       CHIEF COMPLIANT: Cycle 3 Adriamycin  and Cytoxan  and Keytruda   HISTORY OF PRESENT ILLNESS:   History of Present Illness Gabrielle Cross is a 58 year old female undergoing chemotherapy who presents with follicular infections and abdominal pain.  She experiences follicular infections on her leg and eyebrow area, with lesions that become large, inflamed, and painful, sometimes reaching the size of a nickel before rupturing. Despite a 7-day course of Augmentin , the lesions persist and new ones appear. Warm compresses are used for symptom management, but resolution is slow.  She has constant abdominal pain that worsens after eating, with no associated diarrhea, constipation, nausea, or vomiting. The pain is persistent and unrelieved by dietary changes.  She is taking potassium supplements, initially twice daily for the first week, then once daily thereafter. She is undergoing chemotherapy with one treatment remaining, scheduled for March 31, 2024.     ALLERGIES:  is allergic to shellfish allergy.  MEDICATIONS:  Current Outpatient Medications  Medication Sig Dispense Refill   dexamethasone  (DECADRON ) 4 MG tablet With Adriamycin  Cytoxan  take 1 tablet day after chemo and 1 tablet 2 days after chemo with food 8 tablet 0   hydrochlorothiazide  (HYDRODIURIL ) 12.5 MG tablet Take 1 tablet (12.5 mg total) by mouth daily. 90 tablet 4   hydrOXYzine  (VISTARIL ) 25 MG capsule Take 1 capsule (25 mg total) by mouth 3 (three) times daily as  needed for itching. 30 capsule 0   lidocaine -prilocaine  (EMLA ) cream Apply to affected area once 30 g 3    ondansetron  (ZOFRAN ) 8 MG tablet Take 1 tablet (8 mg total) by mouth every 8 (eight) hours as needed for nausea or vomiting. Start on the third day after chemotherapy. 30 tablet 1   potassium chloride  (MICRO-K ) 10 MEQ CR capsule Take one (1) tablet by mouth twice daily for one week, then take one (1) tablet daily. 45 capsule 0   prochlorperazine  (COMPAZINE ) 10 MG tablet Take 1 tablet (10 mg total) by mouth every 6 (six) hours as needed for nausea or vomiting. 30 tablet 1   No current facility-administered medications for this visit.    PHYSICAL EXAMINATION: ECOG PERFORMANCE STATUS: 1 - Symptomatic but completely ambulatory  Vitals:   03/10/24 0930  BP: 112/68  Pulse: 80  Resp: 18  Temp: 98.6 F (37 C)  SpO2: 100%   Filed Weights   03/10/24 0930  Weight: 171 lb 11.2 oz (77.9 kg)    Physical Exam Papular lesions on the eyebrow area as well as nodules under the right arm and bilateral groins felt to be related to either folliculitis or sebaceous cyst  (exam performed in the presence of a chaperone)  LABORATORY DATA:  I have reviewed the data as listed    Latest Ref Rng & Units 03/10/2024    8:55 AM 02/19/2024    8:58 AM 02/06/2024    8:30 AM  CMP  Glucose 70 - 99 mg/dL 894  889  899   BUN 6 - 20 mg/dL 13  11  12    Creatinine 0.44 - 1.00 mg/dL 9.37  9.42  9.49   Sodium 135 - 145 mmol/L 138  140  136   Potassium 3.5 - 5.1 mmol/L 3.2  3.0  3.5   Chloride 98 - 111 mmol/L 103  105  102   CO2 22 - 32 mmol/L 28  29  27    Calcium  8.9 - 10.3 mg/dL 9.8  9.1  8.9   Total Protein 6.5 - 8.1 g/dL 7.1  6.7  6.7   Total Bilirubin 0.0 - 1.2 mg/dL 0.3  0.3  0.4   Alkaline Phos 38 - 126 U/L 76  73  71   AST 15 - 41 U/L 15  14  10    ALT 0 - 44 U/L 16  16  13      Lab Results  Component Value Date   WBC 5.1 03/10/2024   HGB 10.6 (L) 03/10/2024   HCT 31.8 (L) 03/10/2024   MCV 88.8 03/10/2024   PLT 462 (H) 03/10/2024   NEUTROABS 3.2 03/10/2024    ASSESSMENT & PLAN:  Malignant neoplasm  of upper-outer quadrant of right breast in female, estrogen receptor positive (HCC) 10/07/2023:Screening mammogram detected right breast mass at 12:00 measuring 5.1 cm, 1 axillary lymph node biopsy was benign biopsy of the mass grade 3 IDC with necrosis ER 30% weak, PR 0%, Ki67 60%, HER2 negative   Treatment plan: Neoadjuvant chemotherapy with Taxol  carbo Keytruda  followed by Adriamycin  Cytoxan  Keytruda  Breast conserving surgery with sentinel lymph node biopsy Adjuvant radiation therapy Antiestrogen therapy if the final path is estrogen receptor positive CT CAP and bone scan 11/04/2023: 4.7 cm right breast mass, right axillary lymph node 1.8 cm, 0.2 cm lung nodule benign Breast MRI 10/30/2023: Right breast malignancy 4.7 cm, enlarged right axillary lymph node ------------------------------------------------------------------------------------------------------------------------------------------------ Current treatment: Adriamycin , Cytoxan , Keytruda  Cycle 3 Chemo Toxicities: Chemo induced anemia: Hypokalemia Folliculitis:  I sent a prescription for Bactrim .  After the first week she will take Bactrim  3 times a week.  I discontinue dexamethasone . No immune mediated AE Hypokalemia: On oral potassium replacement therapy today's potassium is 3.2.  RTC in 3 weeks for cycle 4 AC-Keytruda  Plan Breast MRI and surgery follow up I sent a message to Dr. Curvin to see the patient after the breast MRI. She will continue maintenance Keytruda  ------------------------------------- Assessment and Plan Assessment & Plan Malignant neoplasm of upper-outer quadrant of right breast, ER+ Nearing the end of chemotherapy with one treatment left. Under the care of Dr. Curvin for surgical planning. Post-chemotherapy imaging is pending. Keytruda  will continue every three weeks, even if surgery is delayed. - Coordinate with Dr. Curvin for post-chemotherapy imaging and surgical planning. - Continue Keytruda  every three weeks. -  Schedule MRI and follow-up with Dr. Yvonne office for surgical appointment.  Chemotherapy-induced folliculitis and pustular skin lesions Persistent pustular lesions on the scalp and leg, with some lesions recurring. Previous treatment with Augmentin  was insufficient. Steroid involvement considered in exacerbating condition. Potential involvement of sweat glands in addition to hair follicles. - Prescribe Bactrim  daily for one week, then three times a week. - Discontinue post-chemotherapy steroid pills. - Advise use of cold compresses to reduce inflammation. - Monitor response to Bactrim  and adjust treatment if necessary.  Hypokalemia Potassium level has increased to 3.2. On potassium supplementation, initially twice daily, now once daily. - Continue current potassium supplementation regimen.  Abdominal pain Constant abdominal pain exacerbated by eating, without associated diarrhea, constipation, nausea, or vomiting. Pain is described as gastric in nature.     No orders of the defined types were placed in this encounter.  The patient has a good understanding of the overall plan. she agrees with it. she will call with any problems that may develop before the next visit here.  I personally spent a total of 30 minutes in the care of the patient today including preparing to see the patient, getting/reviewing separately obtained history, performing a medically appropriate exam/evaluation, counseling and educating, placing orders, referring and communicating with other health care professionals, documenting clinical information in the EHR, independently interpreting results, communicating results, and coordinating care.   Viinay K Dearius Hoffmann, MD 03/10/24

## 2024-03-10 NOTE — Patient Instructions (Signed)
 [facility] - A DEPT OF [facility]  Discharge Instructions: Thank you for choosing [facility] to provide your oncology and hematology care.   If you have a lab appointment with the Cancer Center, please go directly to the Cancer Center and check in at the registration area.   Wear comfortable clothing and clothing appropriate for easy access to any Portacath or PICC line.   We strive to give you quality time with your provider. You may need to reschedule your appointment if you arrive late (15 or more minutes).  Arriving late affects you and other patients whose appointments are after yours.  Also, if you miss three or more appointments without notifying the office, you may be dismissed from the clinic at the provider's discretion.      For prescription refill requests, have your pharmacy contact our office and allow 72 hours for refills to be completed.    Today you received the following chemotherapy and/or immunotherapy agents: pembrolizumab, doxorubicin, cyclophosphamide      To help prevent nausea and vomiting after your treatment, we encourage you to take your nausea medication as directed.  BELOW ARE SYMPTOMS THAT SHOULD BE REPORTED IMMEDIATELY: *FEVER GREATER THAN 100.4 F (38 C) OR HIGHER *CHILLS OR SWEATING *NAUSEA AND VOMITING THAT IS NOT CONTROLLED WITH YOUR NAUSEA MEDICATION *UNUSUAL SHORTNESS OF BREATH *UNUSUAL BRUISING OR BLEEDING *URINARY PROBLEMS (pain or burning when urinating, or frequent urination) *BOWEL PROBLEMS (unusual diarrhea, constipation, pain near the anus) TENDERNESS IN MOUTH AND THROAT WITH OR WITHOUT PRESENCE OF ULCERS (sore throat, sores in mouth, or a toothache) UNUSUAL RASH, SWELLING OR PAIN  UNUSUAL VAGINAL DISCHARGE OR ITCHING   Items with * indicate a potential emergency and should be followed up as soon as possible or go to the Emergency Department if any problems should occur.  Please show the  CHEMOTHERAPY ALERT CARD or IMMUNOTHERAPY ALERT CARD at check-in to the Emergency Department and triage nurse.  Should you have questions after your visit or need to cancel or reschedule your appointment, please contact [facility] - A DEPT OF [facility]  Dept: [phone]  and follow the prompts.  Office hours are 8:00 a.m. to 4:30 p.m. Monday - Friday. Please note that voicemails left after 4:00 p.m. may not be returned until the following business day.  We are closed weekends and major holidays. You have access to a nurse at all times for urgent questions. Please call the main number to the clinic Dept: [phone] and follow the prompts.   For any non-urgent questions, you may also contact your provider using MyChart. We now offer e-Visits for anyone 10 and older to request care online for non-urgent symptoms. For details visit [email or web address].   Also download the MyChart app! Go to the app store, search "MyChart", open the app, select [place], and log in with your MyChart username and password.

## 2024-03-10 NOTE — Assessment & Plan Note (Signed)
 10/07/2023:Screening mammogram detected right breast mass at 12:00 measuring 5.1 cm, 1 axillary lymph node biopsy was benign biopsy of the mass grade 3 IDC with necrosis ER 30% weak, PR 0%, Ki67 60%, HER2 negative   Treatment plan: Neoadjuvant chemotherapy with Taxol  carbo Keytruda  followed by Adriamycin  Cytoxan  Keytruda  Breast conserving surgery with sentinel lymph node biopsy Adjuvant radiation therapy Antiestrogen therapy if the final path is estrogen receptor positive CT CAP and bone scan 11/04/2023: 4.7 cm right breast mass, right axillary lymph node 1.8 cm, 0.2 cm lung nodule benign Breast MRI 10/30/2023: Right breast malignancy 4.7 cm, enlarged right axillary lymph node ------------------------------------------------------------------------------------------------------------------------------------------------ Current treatment: Adriamycin , Cytoxan , Keytruda  Cycle 3 Chemo Toxicities: Chemo induced anemia: Hypokalemia Folliculitis No immune mediated AE  RTC in 3 weeks for cycle 4 AC-Keytruda  Plan Breast MRI and surgery follow up She will continue maintenance Keytruda 

## 2024-03-11 ENCOUNTER — Encounter: Payer: Self-pay | Admitting: *Deleted

## 2024-03-12 ENCOUNTER — Other Ambulatory Visit: Payer: Self-pay | Admitting: Pharmacist

## 2024-03-12 ENCOUNTER — Encounter: Payer: Self-pay | Admitting: Hematology and Oncology

## 2024-03-16 ENCOUNTER — Encounter: Payer: Self-pay | Admitting: Hematology and Oncology

## 2024-03-23 ENCOUNTER — Encounter: Payer: Self-pay | Admitting: Hematology and Oncology

## 2024-03-24 ENCOUNTER — Encounter: Payer: Self-pay | Admitting: Hematology and Oncology

## 2024-03-25 ENCOUNTER — Encounter: Payer: Self-pay | Admitting: Hematology and Oncology

## 2024-03-29 ENCOUNTER — Other Ambulatory Visit (HOSPITAL_COMMUNITY): Payer: Self-pay

## 2024-03-29 ENCOUNTER — Other Ambulatory Visit: Payer: Self-pay | Admitting: Adult Health

## 2024-03-29 ENCOUNTER — Encounter: Payer: Self-pay | Admitting: Hematology and Oncology

## 2024-03-29 ENCOUNTER — Other Ambulatory Visit: Payer: Self-pay

## 2024-03-29 DIAGNOSIS — E876 Hypokalemia: Secondary | ICD-10-CM

## 2024-03-29 MED ORDER — POTASSIUM CHLORIDE ER 10 MEQ PO CPCR
ORAL_CAPSULE | ORAL | 0 refills | Status: DC
  Filled 2024-03-29: qty 45, 30d supply, fill #0

## 2024-03-30 ENCOUNTER — Encounter: Payer: Self-pay | Admitting: Hematology and Oncology

## 2024-03-30 MED FILL — Fosaprepitant Dimeglumine For IV Infusion 150 MG (Base Eq): INTRAVENOUS | Qty: 5 | Status: AC

## 2024-03-31 ENCOUNTER — Inpatient Hospital Stay: Admitting: Hematology and Oncology

## 2024-03-31 ENCOUNTER — Inpatient Hospital Stay

## 2024-03-31 VITALS — BP 115/69 | HR 95 | Temp 97.8°F | Resp 18 | Ht 60.0 in | Wt 169.3 lb

## 2024-03-31 DIAGNOSIS — Z5112 Encounter for antineoplastic immunotherapy: Secondary | ICD-10-CM | POA: Diagnosis not present

## 2024-03-31 DIAGNOSIS — Z170 Estrogen receptor positive status [ER+]: Secondary | ICD-10-CM

## 2024-03-31 DIAGNOSIS — C50411 Malignant neoplasm of upper-outer quadrant of right female breast: Secondary | ICD-10-CM

## 2024-03-31 LAB — CBC WITH DIFFERENTIAL (CANCER CENTER ONLY)
Abs Immature Granulocytes: 0.03 K/uL (ref 0.00–0.07)
Basophils Absolute: 0 K/uL (ref 0.0–0.1)
Basophils Relative: 1 %
Eosinophils Absolute: 0 K/uL (ref 0.0–0.5)
Eosinophils Relative: 0 %
HCT: 30.8 % — ABNORMAL LOW (ref 36.0–46.0)
Hemoglobin: 10.3 g/dL — ABNORMAL LOW (ref 12.0–15.0)
Immature Granulocytes: 1 %
Lymphocytes Relative: 15 %
Lymphs Abs: 0.9 K/uL (ref 0.7–4.0)
MCH: 29.7 pg (ref 26.0–34.0)
MCHC: 33.4 g/dL (ref 30.0–36.0)
MCV: 88.8 fL (ref 80.0–100.0)
Monocytes Absolute: 0.9 K/uL (ref 0.1–1.0)
Monocytes Relative: 16 %
Neutro Abs: 4 K/uL (ref 1.7–7.7)
Neutrophils Relative %: 67 %
Platelet Count: 388 K/uL (ref 150–400)
RBC: 3.47 MIL/uL — ABNORMAL LOW (ref 3.87–5.11)
RDW: 15.9 % — ABNORMAL HIGH (ref 11.5–15.5)
WBC Count: 5.8 K/uL (ref 4.0–10.5)
nRBC: 0 % (ref 0.0–0.2)

## 2024-03-31 LAB — CMP (CANCER CENTER ONLY)
ALT: 21 U/L (ref 0–44)
AST: 15 U/L (ref 15–41)
Albumin: 4 g/dL (ref 3.5–5.0)
Alkaline Phosphatase: 75 U/L (ref 38–126)
Anion gap: 7 (ref 5–15)
BUN: 15 mg/dL (ref 6–20)
CO2: 27 mmol/L (ref 22–32)
Calcium: 9.3 mg/dL (ref 8.9–10.3)
Chloride: 105 mmol/L (ref 98–111)
Creatinine: 0.67 mg/dL (ref 0.44–1.00)
GFR, Estimated: >60 mL/min (ref >60)
Glucose, Bld: 111 mg/dL — ABNORMAL HIGH (ref 70–99)
Potassium: 3.5 mmol/L (ref 3.5–5.1)
Sodium: 139 mmol/L (ref 135–145)
Total Bilirubin: 0.3 mg/dL (ref 0.0–1.2)
Total Protein: 6.8 g/dL (ref 6.5–8.1)

## 2024-03-31 MED ORDER — SODIUM CHLORIDE 0.9 % IV SOLN
150.0000 mg | Freq: Once | INTRAVENOUS | Status: AC
  Administered 2024-03-31: 150 mg via INTRAVENOUS
  Filled 2024-03-31: qty 150

## 2024-03-31 MED ORDER — PEGFILGRASTIM INF DEV 6 MG/0.6ML ~~LOC~~ SOSY
6.0000 mg | PREFILLED_SYRINGE | Freq: Once | SUBCUTANEOUS | Status: AC
  Administered 2024-03-31: 6 mg via SUBCUTANEOUS
  Filled 2024-03-31: qty 0.6

## 2024-03-31 MED ORDER — PALONOSETRON HCL INJECTION 0.25 MG/5ML
0.2500 mg | Freq: Once | INTRAVENOUS | Status: AC
  Administered 2024-03-31: 0.25 mg via INTRAVENOUS
  Filled 2024-03-31: qty 5

## 2024-03-31 MED ORDER — DEXAMETHASONE SOD PHOSPHATE PF 10 MG/ML IJ SOLN
6.0000 mg | Freq: Once | INTRAMUSCULAR | Status: AC
  Administered 2024-03-31: 6 mg via INTRAVENOUS

## 2024-03-31 MED ORDER — SODIUM CHLORIDE 0.9 % IV SOLN
600.0000 mg/m2 | Freq: Once | INTRAVENOUS | Status: AC
  Administered 2024-03-31: 1120 mg via INTRAVENOUS
  Filled 2024-03-31: qty 56

## 2024-03-31 MED ORDER — SODIUM CHLORIDE 0.9 % IV SOLN: INTRAVENOUS | Status: DC

## 2024-03-31 MED ORDER — SODIUM CHLORIDE 0.9% FLUSH
10.0000 mL | INTRAVENOUS | Status: DC | PRN
  Administered 2024-03-31: 10 mL

## 2024-03-31 MED ORDER — SODIUM CHLORIDE 0.9 % IV SOLN
200.0000 mg | Freq: Once | INTRAVENOUS | Status: AC
  Administered 2024-03-31: 200 mg via INTRAVENOUS
  Filled 2024-03-31: qty 200

## 2024-03-31 MED ORDER — HEPARIN SOD (PORK) LOCK FLUSH 100 UNIT/ML IV SOLN: 500.0000 [IU] | Freq: Once | INTRAVENOUS | Status: DC | PRN

## 2024-03-31 MED ORDER — DOXORUBICIN HCL CHEMO IV INJECTION 2 MG/ML
60.0000 mg/m2 | Freq: Once | INTRAVENOUS | Status: AC
  Administered 2024-03-31: 112 mg via INTRAVENOUS
  Filled 2024-03-31: qty 56

## 2024-03-31 NOTE — Assessment & Plan Note (Signed)
 10/07/2023:Screening mammogram detected right breast mass at 12:00 measuring 5.1 cm, 1 axillary lymph node biopsy was benign biopsy of the mass grade 3 IDC with necrosis ER 30% weak, PR 0%, Ki67 60%, HER2 negative   Treatment plan: Neoadjuvant chemotherapy with Taxol  carbo Keytruda  followed by Adriamycin  Cytoxan  Keytruda  Breast conserving surgery with sentinel lymph node biopsy Adjuvant radiation therapy Antiestrogen therapy if the final path is estrogen receptor positive CT CAP and bone scan 11/04/2023: 4.7 cm right breast mass, right axillary lymph node 1.8 cm, 0.2 cm lung nodule benign Breast MRI 10/30/2023: Right breast malignancy 4.7 cm, enlarged right axillary lymph node ------------------------------------------------------------------------------------------------------------------------------------------------ Current treatment: Adriamycin , Cytoxan , Keytruda  Cycle 4 Chemo Toxicities: Chemo induced anemia: Hypokalemia Folliculitis: I sent a prescription for Bactrim .  After the first week she will take Bactrim  3 times a week.  I discontinue dexamethasone . No immune mediated AE Hypokalemia: On oral potassium replacement therapy today's potassium is 3.2.    Plan Breast MRI and surgery follow up  She will continue maintenance Keytruda 

## 2024-03-31 NOTE — Progress Notes (Signed)
 Patient Care Team: [name], NP as PCP - General (Obstetrics and Gynecology) Odean Potts, MD as Consulting Physician (Hematology and Oncology) Tyree Nanetta SAILOR, RN as Oncology Nurse Navigator Curvin Deward MOULD, MD as Consulting Physician (General Surgery) Izell Domino, MD as Attending Physician (Radiation Oncology)  DIAGNOSIS:  Encounter Diagnosis  Name Primary?   Malignant neoplasm of upper-outer quadrant of right breast in female, estrogen receptor positive (HCC) Yes    SUMMARY OF ONCOLOGIC HISTORY: Oncology History  Malignant neoplasm of upper-outer quadrant of right breast in female, estrogen receptor positive (HCC)  10/07/2023 Initial Diagnosis   Screening mammogram detected right breast mass at 12:00 measuring 5.1 cm, 1 axillary lymph node biopsy was benign biopsy of the mass grade 3 IDC with necrosis ER 30% weak, PR 0%, Ki67 60%, HER2 negative   10/22/2023 Cancer Staging   Staging form: Breast, AJCC 8th Edition - Clinical stage from 10/22/2023: Stage IIIA (cT3, cN0, cM0, G3, ER+, PR-, HER2-) - Signed by Odean Potts, MD on 10/22/2023 Stage prefix: Initial diagnosis Histologic grading system: 3 grade system Laterality: Right Staged by: Pathologist and managing physician Stage used in treatment planning: Yes National guidelines used in treatment planning: Yes Type of national guideline used in treatment planning: NCCN   11/02/2023 Genetic Testing   Negative [facility] CancerNext +RNAinsight Panel.  VUS in BARD1 at p.K438E (c.1312A>G).  Report date is 11/02/2023.   The [facility] CancerNext+RNAinsight Panel includes sequencing, rearrangement analysis, and RNA analysis for the following 40 genes: APC, ATM, BAP1, BARD1, BMPR1A, BRCA1, BRCA2, BRIP1, CDH1, CDKN2A, CHEK2, FH, FLCN, MET, MLH1, MSH2, MSH6, MUTYH, NF1, NTHL1, PALB2, PMS2, PTEN, RAD51C, RAD51D, RSP20, SMAD4, STK11, TP53, TSC1, TSC2, and VHL (sequencing and deletion/duplication); AXIN2, HOXB13, MBD4, MSH3, POLD1 and POLE  (sequencing only); EPCAM and GREM1 (deletion/duplication only).   11/06/2023 -  Chemotherapy   Patient is on Treatment Plan : BREAST Pembrolizumab  (200) D1 + Carboplatin  (1.5) D1,8,15 + Paclitaxel  (80) D1,8,15 q21d X 4 cycles / Pembrolizumab  (200) D1 + AC D1 q21d x 4 cycles       CHIEF COMPLIANT: Cycle 4 Adriamycin  Cytoxan  Keytruda   HISTORY OF PRESENT ILLNESS:   History of Present Illness Gabrielle Cross is a 58 year old female undergoing chemotherapy who presents for her final treatment session.  She experienced significant gastrointestinal upset for almost a week following her last chemotherapy session, which was longer than usual. She discontinued Bactrim  after three days, suspecting it contributed to her symptoms, and subsequently noted improvement, though irritation persists with eating. Her hemoglobin is 10.3, with white blood cell and platelet counts within normal limits. She continues potassium supplementation, with previous levels in the high 3 to 3.5 range. An MRI is scheduled for October 31st, with a follow-up on November 4th, and her next Keytruda  treatment is on November 19th.     ALLERGIES:  is allergic to shellfish allergy.  MEDICATIONS:  Current Outpatient Medications  Medication Sig Dispense Refill   hydrochlorothiazide  (HYDRODIURIL ) 12.5 MG tablet Take 1 tablet (12.5 mg total) by mouth daily. 90 tablet 4   hydrOXYzine  (VISTARIL ) 25 MG capsule Take 1 capsule (25 mg total) by mouth 3 (three) times daily as needed for itching. 30 capsule 0   ondansetron  (ZOFRAN ) 8 MG tablet Take 1 tablet (8 mg total) by mouth every 8 (eight) hours as needed for nausea or vomiting. Start on the third day after chemotherapy. 30 tablet 1   potassium chloride  (MICRO-K ) 10 MEQ CR capsule Take one (1) tablet by mouth twice daily  for one week, then take one (1) tablet daily. 45 capsule 0   prochlorperazine  (COMPAZINE ) 10 MG tablet Take 1 tablet (10 mg total) by mouth every 6 (six) hours as needed for  nausea or vomiting. 30 tablet 1   lidocaine -prilocaine  (EMLA ) cream Apply to affected area once (Patient not taking: Reported on 03/31/2024) 30 g 3   sulfamethoxazole -trimethoprim  (BACTRIM  DS) 800-160 MG tablet Take 1 tablet by mouth 2 (two) times daily. (Patient not taking: Reported on 03/31/2024) 30 tablet 1   No current facility-administered medications for this visit.    PHYSICAL EXAMINATION: ECOG PERFORMANCE STATUS: 1 - Symptomatic but completely ambulatory  Vitals:   03/31/24 0926  BP: 115/69  Pulse: 95  Resp: 18  Temp: 97.8 F (36.6 C)  SpO2: 99%   Filed Weights   03/31/24 0926  Weight: 169 lb 4.8 oz (76.8 kg)      LABORATORY DATA:  I have reviewed the data as listed    Latest Ref Rng & Units 03/10/2024    8:55 AM 02/19/2024    8:58 AM 02/06/2024    8:30 AM  CMP  Glucose 70 - 99 mg/dL 894  889  899   BUN 6 - 20 mg/dL 13  11  12    Creatinine 0.44 - 1.00 mg/dL 9.37  9.42  9.49   Sodium 135 - 145 mmol/L 138  140  136   Potassium 3.5 - 5.1 mmol/L 3.2  3.0  3.5   Chloride 98 - 111 mmol/L 103  105  102   CO2 22 - 32 mmol/L 28  29  27    Calcium  8.9 - 10.3 mg/dL 9.8  9.1  8.9   Total Protein 6.5 - 8.1 g/dL 7.1  6.7  6.7   Total Bilirubin 0.0 - 1.2 mg/dL 0.3  0.3  0.4   Alkaline Phos 38 - 126 U/L 76  73  71   AST 15 - 41 U/L 15  14  10    ALT 0 - 44 U/L 16  16  13      Lab Results  Component Value Date   WBC 5.8 03/31/2024   HGB 10.3 (L) 03/31/2024   HCT 30.8 (L) 03/31/2024   MCV 88.8 03/31/2024   PLT 388 03/31/2024   NEUTROABS 4.0 03/31/2024    ASSESSMENT & PLAN:  Malignant neoplasm of upper-outer quadrant of right breast in female, estrogen receptor positive (HCC) 10/07/2023:Screening mammogram detected right breast mass at 12:00 measuring 5.1 cm, 1 axillary lymph node biopsy was benign biopsy of the mass grade 3 IDC with necrosis ER 30% weak, PR 0%, Ki67 60%, HER2 negative   Treatment plan: Neoadjuvant chemotherapy with Taxol  carbo Keytruda  followed by  Adriamycin  Cytoxan  Keytruda  Breast conserving surgery with sentinel lymph node biopsy Adjuvant radiation therapy Antiestrogen therapy if the final path is estrogen receptor positive CT CAP and bone scan 11/04/2023: 4.7 cm right breast mass, right axillary lymph node 1.8 cm, 0.2 cm lung nodule benign Breast MRI 10/30/2023: Right breast malignancy 4.7 cm, enlarged right axillary lymph node ------------------------------------------------------------------------------------------------------------------------------------------------ Current treatment: Adriamycin , Cytoxan , Keytruda  Cycle 4 Chemo Toxicities: Chemo induced anemia: Hypokalemia Folliculitis: She felt the Bactrim  was causing abdominal pain and she discontinued it. No immune mediated AE Hypokalemia: On oral potassium replacement therapy today's potassium is 3.2.  I instructed her to continue potassium for at least another month    Plan Breast MRI and surgery follow up I will place new orders for maintenance Keytruda  I discussed with her that once her surgery  is done we will have to make a decision regarding her maintenance therapy.  If she has a complete response then she will continue maintenance Keytruda .  If she does not have a complete response then we will talk to her about [id number] clinical trial. She will continue maintenance Keytruda  ------------------------------------- Assessment and Plan Assessment & Plan Malignant neoplasm of upper-outer quadrant of right breast Completed chemotherapy for estrogen receptor-positive breast cancer. Experienced gastrointestinal upset from Bactrim , discontinued after three days. Hemoglobin stable, WBC and platelets normal. - Schedule MRI on April 02, 2024. - Follow up with Dr. Avelina on April 06, 2024. - Administer Keytruda  infusion on April 21, 2024. - Discuss surgical results and further treatment options post-surgery, including maintenance Keytruda  or clinical trial if less than  complete response.  Hypokalemia Potassium levels in the high 3 to 3.5 range. Current level pending, no significant changes anticipated. - Continue potassium chloride  supplementation for three more weeks.      No orders of the defined types were placed in this encounter.  The patient has a good understanding of the overall plan. she agrees with it. she will call with any problems that may develop before the next visit here.  I personally spent a total of 30 minutes in the care of the patient today including preparing to see the patient, getting/reviewing separately obtained history, performing a medically appropriate exam/evaluation, counseling and educating, placing orders, referring and communicating with other health care professionals, documenting clinical information in the EHR, independently interpreting results, communicating results, and coordinating care.   Viinay K Rael Tilly, MD 03/31/24

## 2024-03-31 NOTE — Patient Instructions (Addendum)
 [facility] - A DEPT OF [facility]  Discharge Instructions: Thank you for choosing [facility] to provide your oncology and hematology care.   If you have a lab appointment with the Cancer Center, please go directly to the Cancer Center and check in at the registration area.   Wear comfortable clothing and clothing appropriate for easy access to any Portacath or PICC line.   We strive to give you quality time with your provider. You may need to reschedule your appointment if you arrive late (15 or more minutes).  Arriving late affects you and other patients whose appointments are after yours.  Also, if you miss three or more appointments without notifying the office, you may be dismissed from the clinic at the provider's discretion.      For prescription refill requests, have your pharmacy contact our office and allow 72 hours for refills to be completed.    Today you received the following chemotherapy and/or immunotherapy agents: Pembrolizumab  (Keytruda ), Doxorubicin  (Adriamycin ), and Cyclophosphamide  (Cytoxan )   To help prevent nausea and vomiting after your treatment, we encourage you to take your nausea medication as directed.  BELOW ARE SYMPTOMS THAT SHOULD BE REPORTED IMMEDIATELY: *FEVER GREATER THAN 100.4 F (38 C) OR HIGHER *CHILLS OR SWEATING *NAUSEA AND VOMITING THAT IS NOT CONTROLLED WITH YOUR NAUSEA MEDICATION *UNUSUAL SHORTNESS OF BREATH *UNUSUAL BRUISING OR BLEEDING *URINARY PROBLEMS (pain or burning when urinating, or frequent urination) *BOWEL PROBLEMS (unusual diarrhea, constipation, pain near the anus) TENDERNESS IN MOUTH AND THROAT WITH OR WITHOUT PRESENCE OF ULCERS (sore throat, sores in mouth, or a toothache) UNUSUAL RASH, SWELLING OR PAIN  UNUSUAL VAGINAL DISCHARGE OR ITCHING   Items with * indicate a potential emergency and should be followed up as soon as possible or go to the Emergency Department if any problems  should occur.  Please show the CHEMOTHERAPY ALERT CARD or IMMUNOTHERAPY ALERT CARD at check-in to the Emergency Department and triage nurse.  Should you have questions after your visit or need to cancel or reschedule your appointment, please contact [facility] - A DEPT OF [facility]  Dept: [phone]  and follow the prompts.  Office hours are 8:00 a.m. to 4:30 p.m. Monday - Friday. Please note that voicemails left after 4:00 p.m. may not be returned until the following business day.  We are closed weekends and major holidays. You have access to a nurse at all times for urgent questions. Please call the main number to the clinic Dept: [phone] and follow the prompts.   For any non-urgent questions, you may also contact your provider using MyChart. We now offer e-Visits for anyone 50 and older to request care online for non-urgent symptoms. For details visit [email or web address].   Also download the MyChart app! Go to the app store, search MyChart, open the app, select [place], and log in with your MyChart username and password.

## 2024-03-31 NOTE — Research (Signed)
 [name], ICE COMPRESS: RANDOMIZED TRIAL OF LIMB CRYOCOMPRESSION VERSUS CONTINUOUS COMPRESSION VERSUS LOW CYCLIC COMPRESSION FOR THE PREVENTION OF TAXANE-INDUCED PERIPHERAL NEUROPATHY   Week 24 Assessments:  Patient arrives today Accompanied by her husbandfor the Week 24 Assessments. Confirmed patient does not have wounds, sores, or lesions to extremities. Patient has not had any vaccinations since last visit.   PROs: Per study protocol, all PROs required for this visit were completed prior to other study activities.This clinical research coordinator reviewed both forms for completeness and accuracy.  Neuropathy Assessment: All neuro assessments (Neuropen, [place], and Timed Get Up and Go) were completed by Gabrielle Cross PEAK. This CRC acted as a neurosurgeon. Patient tolerated all testing without complaint.   Supplemental Agents: Patient denies taking Duloxetine for any reason. Pt denies any treatments for prescribed for symptom management of peripheral neuropathy since the start of the study. Pt denies taking vitamins and/or supplements for symptom management of peripheral neuropathy. Pt denies receiving any complimentary alternative therapies for symptom management of peripheral neuropathy.  MD/Provider Visit: Patient saw Dr. Gudena for today's visit.    Adverse Events: Solicited AEs reviewed with pt. Attribution per Dr. Gudena. Pt has been off device since 11/28/23. No AEs are related to the study. See AE table below.  Adverse Event CTCAE Grade Onset date Resolved date Relationship to Study Intervention Relationship to Taxane Action Taken Comments  Skin atrophy (solicited) 0   na     Skin hyperpigmentation (solicited) 0   na     Skin hypopigmentation (solicited) 0   na     Skin induration (solicited) 0   na     Skin ulceration (solicited) 0   na     Rash maculopapular (solicited) 0   na     Nail changes (solicited) 0   na     Cold intolerance (solicited)  (general disorders and administration  site conditions- other) 0   na     Frostbite (solicited)  (skin and subcutaneous tissue disorders- other) 0   na     Peripheral sensory neuropathy 1 12/11/23 ongoing  unrelated Definite  Related to taxane  per MD note  Peripheral sensory neuropathy 2 12/23/23 ongoing unrelated Definite  Per MD note     Disposition: Upon completion off all study requirements, patient was escorted to the infusion waiting room to await her next appt.   Plan: The patient was thanked for her time and continued voluntary participation in this study. The next study assessment is the 52-weeks Neuropathy Assessment with PROs, and the appt will be coordinated to occur during a regularly scheduled clinic visit. Patient agreed with this plan. [name] has been provided direct contact information and is encouraged to contact this Coordinator for any needs or questions.  Jahnay Lantier, Ph.D. Clinical Research Coordinator [phone] 03/31/2024

## 2024-04-02 ENCOUNTER — Ambulatory Visit
Admission: RE | Admit: 2024-04-02 | Discharge: 2024-04-02 | Disposition: A | Discharge status: Home / Self Care | Source: Ambulatory Visit | Attending: Hematology and Oncology | Admitting: Hematology and Oncology

## 2024-04-02 DIAGNOSIS — Z170 Estrogen receptor positive status [ER+]: Secondary | ICD-10-CM

## 2024-04-02 DIAGNOSIS — Z1239 Encounter for other screening for malignant neoplasm of breast: Secondary | ICD-10-CM | POA: Diagnosis not present

## 2024-04-02 MED ORDER — GADOPICLENOL 0.5 MMOL/ML IV SOLN
6.0000 mL | Freq: Once | INTRAVENOUS | Status: AC | PRN
Start: 2024-04-02 — End: 2024-04-02
  Administered 2024-04-02: 6 mL via INTRAVENOUS

## 2024-04-05 ENCOUNTER — Other Ambulatory Visit: Payer: Self-pay | Admitting: Hematology and Oncology

## 2024-04-05 DIAGNOSIS — Z170 Estrogen receptor positive status [ER+]: Secondary | ICD-10-CM

## 2024-04-05 NOTE — Progress Notes (Signed)
 Maintenance Keytruda  plan has been ordered

## 2024-04-05 NOTE — Progress Notes (Signed)
 DISCONTINUE ON PATHWAY REGIMEN - Breast     Cycles 1 through 4: A cycle is every 21 days:     Pembrolizumab       Paclitaxel       Carboplatin       Filgrastim -xxxx    Cycles 5 through 8: A cycle is every 21 days:     Pembrolizumab       Doxorubicin       Cyclophosphamide       Pegfilgrastim -xxxx   **Always confirm dose/schedule in your pharmacy ordering system**  PRIOR TREATMENT: [id number]: Pembrolizumab  200 mg D1 + Paclitaxel  80 mg/m2 D1, 8, 15 + Carboplatin  AUC=1.5 D1, 8, 15 q21 Days x 12 Weeks, Followed by Pembrolizumab  200 mg + Doxorubicin  + Cyclophosphamide  q21 Days x 12 Weeks, Followed by Surgery  START ON PATHWAY REGIMEN - Breast     A cycle is every 21 days:     Pembrolizumab    **Always confirm dose/schedule in your pharmacy ordering system**  Patient Characteristics: Post-Neoadjuvant Therapy and Resection, M0, HER2 Negative, No Residual Disease, ER Negative, Adjuvant Therapy - Continuation After Neoadjuvant Therapy Therapeutic Status: Post-Neoadjuvant Therapy and Resection, M0 Residual Invasive Disease Post-Neoadjuvant Therapy<= No ER Status: Negative (-) HER2 Status: Negative (-) PR Status: Negative (-) Intent of Therapy: Curative Intent, Discussed with Patient

## 2024-04-06 ENCOUNTER — Ambulatory Visit: Payer: Self-pay | Admitting: General Surgery

## 2024-04-06 ENCOUNTER — Encounter: Payer: Self-pay | Admitting: *Deleted

## 2024-04-06 DIAGNOSIS — Z170 Estrogen receptor positive status [ER+]: Secondary | ICD-10-CM | POA: Diagnosis not present

## 2024-04-06 DIAGNOSIS — C50411 Malignant neoplasm of upper-outer quadrant of right female breast: Secondary | ICD-10-CM | POA: Diagnosis not present

## 2024-04-06 NOTE — Progress Notes (Signed)
 REFERRING PHYSICIAN:  Ginette Cozier PROVIDER:  DEWARD GARNETTE NULL, MD MRN: [id number] DOB: 02-27-1966 DATE OF ENCOUNTER: 04/06/2024 Subjective     Chief Complaint:  RE-CHECK ( breast checck, fin chemo MR 10/31/)   History of Present Illness: Gabrielle Cross is a 58 y.o. female who is seen today as an office consultation for evaluation of  RE-CHECK ( breast checck, fin chemo MR 10/31/)   We are asked to see the patient in consultation by Dr. Odean to evaluate her for a new right breast cancer.  The patient is a 58 year old female who recently went for a routine screening mammogram.  It has been several years since she has had a mammogram.  She was found to have a 5.1 cm mass in the central portion of the right breast.  There was 1 abnormal lymph node which was biopsied and benign.  The mass was biopsied and came back as a grade 3 invasive ductal cancer that was weakly ER positive and PR negative and HER2 negative with a Ki-67 of 60%.  She does have some hypertension but does not smoke.  She finished neoadjuvant chemotherapy on October 29.  Her recent MRI showed that the cancer shrunk from 5.1 to 3.5 cm.  Her lymph nodes looked normal.    Review of Systems: A complete review of systems was obtained from the patient.  I have reviewed this information and discussed as appropriate with the patient.  See HPI as well for other ROS.  ROS   Medical History: Past Medical History:  Diagnosis Date  . Arthritis   . Hyperlipidemia     Patient Active Problem List  Diagnosis  . Malignant neoplasm of upper-outer quadrant of right breast in female, estrogen receptor positive (CMS/HHS-HCC)  . Malignant neoplasm of central portion of right breast in female, estrogen receptor negative (CMS/HHS-HCC)    Past Surgical History:  Procedure Laterality Date  . Dilatation and Curettage/hysteroscopy N/A 04/19/2022  . SABRARight Breast Biopsy Right 10/07/2023     Allergies  Allergen Reactions  .  Shellfish Containing Products Diarrhea    Current Outpatient Medications on File Prior to Visit  Medication Sig Dispense Refill  . cholecalciferol  (VITAMIN D3) 2,000 unit tablet Take 2,000 Units by mouth once daily    . FLUoxetine  (PROZAC ) 10 MG capsule Take 10 mg by mouth once daily    . hydroCHLOROthiazide  (HYDRODIURIL ) 12.5 MG tablet Take 12.5 mg by mouth once daily    . progesterone  (PROMETRIUM ) 100 MG capsule Take 100 mg by mouth once daily    . rizatriptan  (MAXALT ) 10 MG tablet Take 10 mg by mouth once as needed for Migraine     No current facility-administered medications on file prior to visit.    Family History  Problem Relation Age of Onset  . Hyperlipidemia (Elevated cholesterol) Father      Social History   Tobacco Use  Smoking Status Never  Smokeless Tobacco Never     Social History   Socioeconomic History  . Marital status: Married  Tobacco Use  . Smoking status: Never  . Smokeless tobacco: Never  Vaping Use  . Vaping status: Never Used  Substance and Sexual Activity  . Alcohol use: Yes    Comment: Socially  . Drug use: Never   Social Drivers of Corporate Investment Banker Strain: Low Risk  (12/08/2023)   Received from [facility]   Overall Financial Resource Strain (CARDIA)   . How hard is it for you to pay for  the very basics like food, housing, medical care, and heating?: Not hard at all  Food Insecurity: No Food Insecurity (12/08/2023)   Received from [facility]   Hunger Vital Sign   . Within the past 12 months, you worried that your food would run out before you got the money to buy more.: Never true   . Within the past 12 months, the food you bought just didn't last and you didn't have money to get more.: Never true  Transportation Needs: No Transportation Needs (12/08/2023)   Received from [facility] - Transportation   . In the past 12 months, has lack of transportation kept you from medical appointments or from getting medications?:  No   . In the past 12 months, has lack of transportation kept you from meetings, work, or from getting things needed for daily living?: No  Physical Activity: Insufficiently Active (12/08/2023)   Received from [facility]   Exercise Vital Sign   . On average, how many days per week do you engage in moderate to strenuous exercise (like a brisk walk)?: 1 day   . On average, how many minutes do you engage in exercise at this level?: 30 min  Stress: No Stress Concern Present (12/08/2023)   Received from [facility] - Occupational Stress Questionnaire   . Do you feel stress - tense, restless, nervous, or anxious, or unable to sleep at night because your mind is troubled all the time - these days?: Not at all  Social Connections: Unknown (12/08/2023)   Received from [facility]   Social Connection and Isolation Panel   . In a typical week, how many times do you talk on the phone with family, friends, or neighbors?: Once a week   . How often do you get together with friends or relatives?: Patient declined   . How often do you attend church or religious services?: Patient declined   . Do you belong to any clubs or organizations such as church groups, unions, fraternal or athletic groups, or school groups?: No   . Are you married, widowed, divorced, separated, never married, or living with a partner?: Married  Housing Stability: Unknown (04/06/2024)   Housing Stability Vital Sign   . Homeless in the Last Year: No    Objective:   Vitals:   04/06/24 1117 04/06/24 1118  Pulse: 97   Resp: 16   Temp: 36.2 C (97.2 F)   SpO2: 97%   Weight: 77 kg (169 lb 12.8 oz)   Height: 152.4 cm (5')   PainSc:  0-No pain    Body mass index is 33.16 kg/m.  Physical Exam Vitals reviewed.  Constitutional:      General: She is not in acute distress.    Appearance: Normal appearance.  HENT:     Head: Normocephalic and atraumatic.     Right Ear: External ear normal.      Left Ear: External ear normal.     Nose: Nose normal.     Mouth/Throat:     Mouth: Mucous membranes are moist.     Pharynx: Oropharynx is clear.  Eyes:     General: No scleral icterus.    Extraocular Movements: Extraocular movements intact.     Conjunctiva/sclera: Conjunctivae normal.     Pupils: Pupils are equal, round, and reactive to light.  Cardiovascular:     Rate and Rhythm: Normal rate and regular rhythm.     Pulses: Normal  pulses.     Heart sounds: Normal heart sounds.  Pulmonary:     Effort: Pulmonary effort is normal. No respiratory distress.     Breath sounds: Normal breath sounds.  Abdominal:     General: Bowel sounds are normal.     Palpations: Abdomen is soft.     Tenderness: There is no abdominal tenderness.  Musculoskeletal:        General: No swelling, tenderness or deformity. Normal range of motion.     Cervical back: Normal range of motion and neck supple.  Skin:    General: Skin is warm and dry.     Coloration: Skin is not jaundiced.  Neurological:     General: No focal deficit present.     Mental Status: She is alert and oriented to person, place, and time.  Psychiatric:        Mood and Affect: Mood normal.        Behavior: Behavior normal.      Breast: It is difficult to palpate the tumor in the right breast.  There is some skin puckering of the upper areola.  There is no palpable mass in the left breast.  There is no palpable axillary, supraclavicular, or or cervical lymphadenopathy.  Labs, Imaging and Diagnostic Testing:   Assessment and Plan:     Diagnoses and all orders for this visit:  Malignant neoplasm of upper-outer quadrant of right breast in female, estrogen receptor positive (CMS/HHS-HCC) -     CCS Case Posting Request; Future    The patient appears to have a 5.1 cm mass in the central portion of the right breast with clinically negative nodes and is essentially triple negative.  The tumor has shrunk some with neoadjuvant  chemotherapy.  She is now ready to schedule her definitive surgery.  She has elected for bilateral mastectomies with right sentinel lymph node biopsy.  She does not want reconstruction.  I have discussed with her in detail the risks and benefits of the operation as well as some of the technical aspects including skin flap necrosis and she understands and wishes to proceed.  We will move forward with surgical scheduling    DEWARD GARNETTE NULL, MD    I spent a total of 60 minutes in both face-to-face and non-face-to-face activities, excluding procedures performed, for this visit on the date of this encounter.

## 2024-04-07 ENCOUNTER — Encounter: Payer: Self-pay | Admitting: Hematology and Oncology

## 2024-04-08 ENCOUNTER — Other Ambulatory Visit: Payer: Self-pay | Admitting: Hematology and Oncology

## 2024-04-08 DIAGNOSIS — Z170 Estrogen receptor positive status [ER+]: Secondary | ICD-10-CM

## 2024-04-12 ENCOUNTER — Encounter: Payer: Self-pay | Admitting: *Deleted

## 2024-04-13 ENCOUNTER — Ambulatory Visit

## 2024-04-13 ENCOUNTER — Encounter: Payer: Self-pay | Admitting: Hematology and Oncology

## 2024-04-21 ENCOUNTER — Telehealth: Payer: Self-pay

## 2024-04-21 NOTE — Telephone Encounter (Signed)
 Spoke with pt regarding her FMLA forms being completed,faxed,and confirmation received. Pt Copy was emailed upon request.

## 2024-04-22 ENCOUNTER — Encounter: Payer: Self-pay | Admitting: Hematology and Oncology

## 2024-04-22 ENCOUNTER — Inpatient Hospital Stay (HOSPITAL_BASED_OUTPATIENT_CLINIC_OR_DEPARTMENT_OTHER): Admitting: Hematology and Oncology

## 2024-04-22 ENCOUNTER — Inpatient Hospital Stay

## 2024-04-22 ENCOUNTER — Inpatient Hospital Stay: Attending: Hematology and Oncology

## 2024-04-22 VITALS — BP 120/73 | HR 75 | Resp 16

## 2024-04-22 VITALS — BP 123/65 | HR 81 | Temp 97.7°F | Resp 18 | Ht 60.0 in | Wt 169.1 lb

## 2024-04-22 DIAGNOSIS — Z5112 Encounter for antineoplastic immunotherapy: Secondary | ICD-10-CM | POA: Insufficient documentation

## 2024-04-22 DIAGNOSIS — C50411 Malignant neoplasm of upper-outer quadrant of right female breast: Secondary | ICD-10-CM

## 2024-04-22 DIAGNOSIS — Z79620 Long term (current) use of immunosuppressive biologic: Secondary | ICD-10-CM | POA: Insufficient documentation

## 2024-04-22 DIAGNOSIS — C50412 Malignant neoplasm of upper-outer quadrant of left female breast: Secondary | ICD-10-CM | POA: Insufficient documentation

## 2024-04-22 DIAGNOSIS — Z1732 Human epidermal growth factor receptor 2 negative status: Secondary | ICD-10-CM | POA: Diagnosis not present

## 2024-04-22 DIAGNOSIS — Z1722 Progesterone receptor negative status: Secondary | ICD-10-CM | POA: Insufficient documentation

## 2024-04-22 DIAGNOSIS — Z170 Estrogen receptor positive status [ER+]: Secondary | ICD-10-CM | POA: Diagnosis not present

## 2024-04-22 LAB — CMP (CANCER CENTER ONLY)
ALT: 25 U/L (ref 0–44)
AST: 22 U/L (ref 15–41)
Albumin: 4 g/dL (ref 3.5–5.0)
Alkaline Phosphatase: 81 U/L (ref 38–126)
Anion gap: 11 (ref 5–15)
BUN: 17 mg/dL (ref 6–20)
CO2: 26 mmol/L (ref 22–32)
Calcium: 9.4 mg/dL (ref 8.9–10.3)
Chloride: 101 mmol/L (ref 98–111)
Creatinine: 0.72 mg/dL (ref 0.44–1.00)
GFR, Estimated: >60 mL/min (ref >60)
Glucose, Bld: 113 mg/dL — ABNORMAL HIGH (ref 70–99)
Potassium: 3.5 mmol/L (ref 3.5–5.1)
Sodium: 138 mmol/L (ref 135–145)
Total Bilirubin: 0.2 mg/dL (ref 0.0–1.2)
Total Protein: 6.5 g/dL (ref 6.5–8.1)

## 2024-04-22 LAB — CBC WITH DIFFERENTIAL (CANCER CENTER ONLY)
Abs Immature Granulocytes: 0.02 K/uL (ref 0.00–0.07)
Basophils Absolute: 0 K/uL (ref 0.0–0.1)
Basophils Relative: 0 %
Eosinophils Absolute: 0.1 K/uL (ref 0.0–0.5)
Eosinophils Relative: 1 %
HCT: 29.9 % — ABNORMAL LOW (ref 36.0–46.0)
Hemoglobin: 9.7 g/dL — ABNORMAL LOW (ref 12.0–15.0)
Immature Granulocytes: 0 %
Lymphocytes Relative: 19 %
Lymphs Abs: 1 K/uL (ref 0.7–4.0)
MCH: 28.8 pg (ref 26.0–34.0)
MCHC: 32.4 g/dL (ref 30.0–36.0)
MCV: 88.7 fL (ref 80.0–100.0)
Monocytes Absolute: 0.8 K/uL (ref 0.1–1.0)
Monocytes Relative: 16 %
Neutro Abs: 3.3 K/uL (ref 1.7–7.7)
Neutrophils Relative %: 64 %
Platelet Count: 374 K/uL (ref 150–400)
RBC: 3.37 MIL/uL — ABNORMAL LOW (ref 3.87–5.11)
RDW: 15.9 % — ABNORMAL HIGH (ref 11.5–15.5)
WBC Count: 5.1 K/uL (ref 4.0–10.5)
nRBC: 0 % (ref 0.0–0.2)

## 2024-04-22 LAB — TSH: TSH: 0.591 u[IU]/mL (ref 0.350–4.500)

## 2024-04-22 MED ORDER — SODIUM CHLORIDE 0.9% FLUSH: 10.0000 mL | INTRAVENOUS | Status: DC | PRN

## 2024-04-22 MED ORDER — SODIUM CHLORIDE 0.9 % IV SOLN
200.0000 mg | Freq: Once | INTRAVENOUS | Status: AC
  Administered 2024-04-22: 200 mg via INTRAVENOUS
  Filled 2024-04-22: qty 200

## 2024-04-22 MED ORDER — SODIUM CHLORIDE 0.9 % IV SOLN: INTRAVENOUS | Status: DC

## 2024-04-22 NOTE — Assessment & Plan Note (Addendum)
 10/07/2023:Screening mammogram detected right breast mass at 12:00 measuring 5.1 cm, 1 axillary lymph node biopsy was benign biopsy of the mass grade 3 IDC with necrosis ER 30% weak, PR 0%, Ki67 60%, HER2 negative   Treatment plan: Neoadjuvant chemotherapy with Taxol  carbo Keytruda  followed by Adriamycin  Cytoxan  Keytruda  11/06/2023-03/11/2024 Breast conserving surgery with sentinel lymph node biopsy Adjuvant radiation therapy Antiestrogen therapy if the final path is estrogen receptor positive CT CAP and bone scan 11/04/2023: 4.7 cm right breast mass, right axillary lymph node 1.8 cm, 0.2 cm lung nodule benign Breast MRI 10/30/2023: Right breast malignancy 4.7 cm, enlarged right axillary lymph node ------------------------------------------------------------------------------------------------------------------------------------------------ Current treatment: Keytruda  maintenance Breast MRI 04/02/2024: Partial response with significant debulking, residual non-mass enhancement 3.5 cm Follow-up after surgery to discuss pathology report along with Keytruda  infusion

## 2024-04-22 NOTE — Patient Instructions (Signed)
 [facility] - A DEPT OF [facility]   Discharge Instructions: Thank you for choosing [facility] to provide your oncology and hematology care.   If you have a lab appointment with the Cancer Center, please go directly to the Cancer Center and check in at the registration area.   Wear comfortable clothing and clothing appropriate for easy access to any Portacath or PICC line.   We strive to give you quality time with your provider. You may need to reschedule your appointment if you arrive late (15 or more minutes).  Arriving late affects you and other patients whose appointments are after yours.  Also, if you miss three or more appointments without notifying the office, you may be dismissed from the clinic at the provider's discretion.      For prescription refill requests, have your pharmacy contact our office and allow 72 hours for refills to be completed.    Today you received the following chemotherapy and/or immunotherapy agents: Pembrolizumab Rande Lawman)      To help prevent nausea and vomiting after your treatment, we encourage you to take your nausea medication as directed.  BELOW ARE SYMPTOMS THAT SHOULD BE REPORTED IMMEDIATELY: *FEVER GREATER THAN 100.4 F (38 C) OR HIGHER *CHILLS OR SWEATING *NAUSEA AND VOMITING THAT IS NOT CONTROLLED WITH YOUR NAUSEA MEDICATION *UNUSUAL SHORTNESS OF BREATH *UNUSUAL BRUISING OR BLEEDING *URINARY PROBLEMS (pain or burning when urinating, or frequent urination) *BOWEL PROBLEMS (unusual diarrhea, constipation, pain near the anus) TENDERNESS IN MOUTH AND THROAT WITH OR WITHOUT PRESENCE OF ULCERS (sore throat, sores in mouth, or a toothache) UNUSUAL RASH, SWELLING OR PAIN  UNUSUAL VAGINAL DISCHARGE OR ITCHING   Items with * indicate a potential emergency and should be followed up as soon as possible or go to the Emergency Department if any problems should occur.  Please show the CHEMOTHERAPY ALERT CARD  or IMMUNOTHERAPY ALERT CARD at check-in to the Emergency Department and triage nurse.  Should you have questions after your visit or need to cancel or reschedule your appointment, please contact [facility]  Dept: [phone]  and follow the prompts.  Office hours are 8:00 a.m. to 4:30 p.m. Monday - Friday. Please note that voicemails left after 4:00 p.m. may not be returned until the following business day.  We are closed weekends and major holidays. You have access to a nurse at all times for urgent questions. Please call the main number to the clinic Dept: [phone] and follow the prompts.   For any non-urgent questions, you may also contact your provider using MyChart. We now offer e-Visits for anyone 75 and older to request care online for non-urgent symptoms. For details visit [email or web address].   Also download the MyChart app! Go to the app store, search "MyChart", open the app, select , and log in with your MyChart username and password.

## 2024-04-22 NOTE — Progress Notes (Signed)
 Patient Care Team: [name], NP as PCP - General (Obstetrics and Gynecology) Odean Potts, MD as Consulting Physician (Hematology and Oncology) Tyree Nanetta SAILOR, RN as Oncology Nurse Navigator Curvin Deward MOULD, MD as Consulting Physician (General Surgery) Izell Domino, MD as Attending Physician (Radiation Oncology)  DIAGNOSIS:  Encounter Diagnosis  Name Primary?   Malignant neoplasm of upper-outer quadrant of right breast in female, estrogen receptor positive (HCC) Yes    SUMMARY OF ONCOLOGIC HISTORY: Oncology History  Malignant neoplasm of upper-outer quadrant of right breast in female, estrogen receptor positive (HCC)  10/07/2023 Initial Diagnosis   Screening mammogram detected right breast mass at 12:00 measuring 5.1 cm, 1 axillary lymph node biopsy was benign biopsy of the mass grade 3 IDC with necrosis ER 30% weak, PR 0%, Ki67 60%, HER2 negative   10/22/2023 Cancer Staging   Staging form: Breast, AJCC 8th Edition - Clinical stage from 10/22/2023: Stage IIIA (cT3, cN0, cM0, G3, ER+, PR-, HER2-) - Signed by Odean Potts, MD on 10/22/2023 Stage prefix: Initial diagnosis Histologic grading system: 3 grade system Laterality: Right Staged by: Pathologist and managing physician Stage used in treatment planning: Yes National guidelines used in treatment planning: Yes Type of national guideline used in treatment planning: NCCN   11/02/2023 Genetic Testing   Negative [facility] +RNAinsight Panel.  VUS in BARD1 at p.K438E (c.1312A>G).  Report date is 11/02/2023.   The [facility] Cancer[employer]+RNAinsight Panel includes sequencing, rearrangement analysis, and RNA analysis for the following 40 genes: APC, ATM, BAP1, BARD1, BMPR1A, BRCA1, BRCA2, BRIP1, CDH1, CDKN2A, CHEK2, FH, FLCN, MET, MLH1, MSH2, MSH6, MUTYH, NF1, NTHL1, PALB2, PMS2, PTEN, RAD51C, RAD51D, RSP20, SMAD4, STK11, TP53, TSC1, TSC2, and VHL (sequencing and deletion/duplication); AXIN2, HOXB13, MBD4, MSH3, POLD1 and POLE  (sequencing only); EPCAM and GREM1 (deletion/duplication only).   11/06/2023 - 03/31/2024 Chemotherapy   Patient is on Treatment Plan : BREAST Pembrolizumab  (200) D1 + Carboplatin  (1.5) D1,8,15 + Paclitaxel  (80) D1,8,15 q21d X 4 cycles / Pembrolizumab  (200) D1 + AC D1 q21d x 4 cycles     04/22/2024 -  Chemotherapy   Patient is on Treatment Plan : BREAST Pembrolizumab  (200) q21d x 27 weeks       CHIEF COMPLIANT: Follow-up on Keytruda   HISTORY OF PRESENT ILLNESS:  History of Present Illness Gabrielle Cross is a 58 year old female who presents for follow-up and treatment with Keytruda .  She is currently receiving Keytruda  and is here for her scheduled infusion. Recent MRI results show a significant reduction in tumor size. Hemoglobin level is 9.7, indicating slight anemia, while white blood cell count and platelets are normal. She inquires about the timing of radiation therapy.     ALLERGIES:  is allergic to shellfish allergy.  MEDICATIONS:  Current Outpatient Medications  Medication Sig Dispense Refill   hydrochlorothiazide  (HYDRODIURIL ) 12.5 MG tablet Take 1 tablet (12.5 mg total) by mouth daily. 90 tablet 4   potassium chloride  (MICRO-K ) 10 MEQ CR capsule Take one (1) tablet by mouth twice daily for one week, then take one (1) tablet daily. 45 capsule 0   No current facility-administered medications for this visit.   Facility-Administered Medications Ordered in Other Visits  Medication Dose Route Frequency Provider Last Rate Last Admin   0.9 %  sodium chloride  infusion   Intravenous Continuous Adelynne Joerger, MD 10 mL/hr at 04/22/24 1539 New Bag at 04/22/24 1539   sodium chloride  flush (NS) 0.9 % injection 10 mL  10 mL Intracatheter PRN Odean Potts, MD  PHYSICAL EXAMINATION: ECOG PERFORMANCE STATUS: 1 - Symptomatic but completely ambulatory  Vitals:   04/22/24 1450  BP: 123/65  Pulse: 81  Resp: 18  Temp: 97.7 F (36.5 C)  SpO2: 100%   Filed Weights   04/22/24  1450  Weight: 169 lb 1.6 oz (76.7 kg)    LABORATORY DATA:  I have reviewed the data as listed    Latest Ref Rng & Units 04/22/2024    2:25 PM 03/31/2024    9:08 AM 03/10/2024    8:55 AM  CMP  Glucose 70 - 99 mg/dL 886  888  894   BUN 6 - 20 mg/dL 17  15  13    Creatinine 0.44 - 1.00 mg/dL 9.27  9.32  9.37   Sodium 135 - 145 mmol/L 138  139  138   Potassium 3.5 - 5.1 mmol/L 3.5  3.5  3.2   Chloride 98 - 111 mmol/L 101  105  103   CO2 22 - 32 mmol/L 26  27  28    Calcium  8.9 - 10.3 mg/dL 9.4  9.3  9.8   Total Protein 6.5 - 8.1 g/dL 6.5  6.8  7.1   Total Bilirubin 0.0 - 1.2 mg/dL 0.2  0.3  0.3   Alkaline Phos 38 - 126 U/L 81  75  76   AST 15 - 41 U/L 22  15  15    ALT 0 - 44 U/L 25  21  16      Lab Results  Component Value Date   WBC 5.1 04/22/2024   HGB 9.7 (L) 04/22/2024   HCT 29.9 (L) 04/22/2024   MCV 88.7 04/22/2024   PLT 374 04/22/2024   NEUTROABS 3.3 04/22/2024    ASSESSMENT & PLAN:  Malignant neoplasm of upper-outer quadrant of right breast in female, estrogen receptor positive (HCC) 10/07/2023:Screening mammogram detected right breast mass at 12:00 measuring 5.1 cm, 1 axillary lymph node biopsy was benign biopsy of the mass grade 3 IDC with necrosis ER 30% weak, PR 0%, Ki67 60%, HER2 negative   Treatment plan: Neoadjuvant chemotherapy with Taxol  carbo Keytruda  followed by Adriamycin  Cytoxan  Keytruda  11/06/2023-03/11/2024 Breast conserving surgery with sentinel lymph node biopsy Adjuvant radiation therapy Antiestrogen therapy if the final path is estrogen receptor positive CT CAP and bone scan 11/04/2023: 4.7 cm right breast mass, right axillary lymph node 1.8 cm, 0.2 cm lung nodule benign Breast MRI 10/30/2023: Right breast malignancy 4.7 cm, enlarged right axillary lymph node ------------------------------------------------------------------------------------------------------------------------------------------------ Current treatment: Keytruda  maintenance Breast MRI  04/02/2024: Partial response with significant debulking, residual non-mass enhancement 3.5 cm Follow-up after surgery to discuss pathology report along with Keytruda  infusion Assessment & Plan Right upper-outer quadrant breast cancer MRI indicated significant tumor reduction. Uncertainty about necrotic versus viable cells. Awaiting final pathology for further treatment decisions. Discussed clinical trial eligibility post-surgery. - Continue Pembrolizumab  (Keytruda ) every 21 days. - Proceed with scheduled surgery. - Discuss final pathology and treatment options post-surgery. - Consider clinical trial eligibility post-surgery. - Plan radiation therapy 4-6 weeks post-surgery.  Anemia secondary to cancer therapy Mild anemia with hemoglobin at 9.7. White blood cell count and platelets normal.      No orders of the defined types were placed in this encounter.  The patient has a good understanding of the overall plan. she agrees with it. she will call with any problems that may develop before the next visit here.  I personally spent a total of 30 minutes in the care of the patient today including preparing to see  the patient, getting/reviewing separately obtained history, performing a medically appropriate exam/evaluation, counseling and educating, placing orders, referring and communicating with other health care professionals, documenting clinical information in the EHR, independently interpreting results, communicating results, and coordinating care.   Viinay K Lundyn Coste, MD 04/22/24

## 2024-04-23 ENCOUNTER — Inpatient Hospital Stay

## 2024-04-23 ENCOUNTER — Inpatient Hospital Stay: Admitting: Adult Health

## 2024-04-23 ENCOUNTER — Telehealth: Payer: Self-pay

## 2024-04-23 LAB — T4: T4, Total: 5.5 ug/dL (ref 4.5–12.0)

## 2024-04-23 NOTE — Telephone Encounter (Signed)
 Spoke with the pt regarding her spouse completed FMLA forms being completed,faxed,and confirmation received. Pt Was emailed upon request.

## 2024-04-25 ENCOUNTER — Other Ambulatory Visit: Payer: Self-pay | Admitting: Adult Health

## 2024-04-25 DIAGNOSIS — E876 Hypokalemia: Secondary | ICD-10-CM

## 2024-04-26 ENCOUNTER — Other Ambulatory Visit: Payer: Self-pay

## 2024-04-26 ENCOUNTER — Other Ambulatory Visit (HOSPITAL_COMMUNITY): Payer: Self-pay

## 2024-04-26 MED ORDER — POTASSIUM CHLORIDE ER 10 MEQ PO CPCR
ORAL_CAPSULE | ORAL | 0 refills | Status: DC
  Filled 2024-04-26: qty 45, 30d supply, fill #0

## 2024-04-27 ENCOUNTER — Other Ambulatory Visit: Payer: Self-pay

## 2024-04-27 ENCOUNTER — Encounter (HOSPITAL_BASED_OUTPATIENT_CLINIC_OR_DEPARTMENT_OTHER): Payer: Self-pay | Admitting: General Surgery

## 2024-05-07 ENCOUNTER — Ambulatory Visit (HOSPITAL_BASED_OUTPATIENT_CLINIC_OR_DEPARTMENT_OTHER)
Admission: RE | Admit: 2024-05-07 | Discharge: 2024-05-08 | Disposition: A | Attending: General Surgery | Admitting: General Surgery

## 2024-05-07 ENCOUNTER — Encounter (HOSPITAL_BASED_OUTPATIENT_CLINIC_OR_DEPARTMENT_OTHER): Payer: Self-pay | Admitting: General Surgery

## 2024-05-07 ENCOUNTER — Ambulatory Visit (HOSPITAL_BASED_OUTPATIENT_CLINIC_OR_DEPARTMENT_OTHER): Admitting: Anesthesiology

## 2024-05-07 ENCOUNTER — Encounter (HOSPITAL_BASED_OUTPATIENT_CLINIC_OR_DEPARTMENT_OTHER): Admission: RE | Disposition: A | Payer: Self-pay | Source: Home / Self Care | Attending: General Surgery

## 2024-05-07 ENCOUNTER — Ambulatory Visit (HOSPITAL_COMMUNITY)
Admission: RE | Admit: 2024-05-07 | Discharge: 2024-05-07 | Disposition: A | Source: Ambulatory Visit | Attending: General Surgery | Admitting: General Surgery

## 2024-05-07 DIAGNOSIS — C50911 Malignant neoplasm of unspecified site of right female breast: Secondary | ICD-10-CM | POA: Diagnosis present

## 2024-05-07 DIAGNOSIS — Z170 Estrogen receptor positive status [ER+]: Secondary | ICD-10-CM

## 2024-05-07 HISTORY — PX: TOTAL MASTECTOMY: SHX6129

## 2024-05-07 HISTORY — PX: MASTECTOMY W/ SENTINEL NODE BIOPSY: SHX2001

## 2024-05-07 SURGERY — MASTECTOMY WITH SENTINEL LYMPH NODE BIOPSY
Anesthesia: Regional | Site: Breast | Laterality: Right

## 2024-05-07 MED ORDER — DEXAMETHASONE SOD PHOSPHATE PF 10 MG/ML IJ SOLN
INTRAMUSCULAR | Status: DC | PRN
  Administered 2024-05-07: 5 mg via INTRAVENOUS

## 2024-05-07 MED ORDER — OXYCODONE HCL 5 MG PO TABS: 5.0000 mg | ORAL_TABLET | Freq: Once | ORAL | Status: DC | PRN

## 2024-05-07 MED ORDER — ROCURONIUM BROMIDE 10 MG/ML (PF) SYRINGE
PREFILLED_SYRINGE | INTRAVENOUS | Status: AC
  Filled 2024-05-07: qty 10

## 2024-05-07 MED ORDER — AMISULPRIDE (ANTIEMETIC) 5 MG/2ML IV SOLN: 10.0000 mg | Freq: Once | INTRAVENOUS | Status: DC | PRN

## 2024-05-07 MED ORDER — MIDAZOLAM HCL 2 MG/2ML IJ SOLN
INTRAMUSCULAR | Status: AC
  Filled 2024-05-07: qty 2

## 2024-05-07 MED ORDER — MIDAZOLAM HCL (PF) 2 MG/2ML IJ SOLN
2.0000 mg | Freq: Once | INTRAMUSCULAR | Status: AC
  Administered 2024-05-07: 2 mg via INTRAVENOUS

## 2024-05-07 MED ORDER — HYDROCHLOROTHIAZIDE 12.5 MG PO TABS
12.5000 mg | ORAL_TABLET | Freq: Every day | ORAL | Status: DC
  Filled 2024-05-07: qty 1

## 2024-05-07 MED ORDER — LIDOCAINE 2% (20 MG/ML) 5 ML SYRINGE
INTRAMUSCULAR | Status: AC
  Filled 2024-05-07: qty 5

## 2024-05-07 MED ORDER — KETOROLAC TROMETHAMINE 30 MG/ML IJ SOLN: 30.0000 mg | Freq: Once | INTRAMUSCULAR | Status: AC | PRN

## 2024-05-07 MED ORDER — PROPOFOL 500 MG/50ML IV EMUL
INTRAVENOUS | Status: AC
  Filled 2024-05-07: qty 150

## 2024-05-07 MED ORDER — LIDOCAINE 2% (20 MG/ML) 5 ML SYRINGE
INTRAMUSCULAR | Status: DC | PRN
  Administered 2024-05-07: 60 mg via INTRAVENOUS

## 2024-05-07 MED ORDER — CHLORHEXIDINE GLUCONATE CLOTH 2 % EX PADS: 6.0000 | MEDICATED_PAD | Freq: Once | CUTANEOUS | Status: DC

## 2024-05-07 MED ORDER — METHYLENE BLUE 20 MG/2ML IV SOSY
PREFILLED_SYRINGE | INTRAVENOUS | Status: AC
  Filled 2024-05-07: qty 2

## 2024-05-07 MED ORDER — PHENYLEPHRINE 80 MCG/ML (10ML) SYRINGE FOR IV PUSH (FOR BLOOD PRESSURE SUPPORT)
PREFILLED_SYRINGE | INTRAVENOUS | Status: AC
  Filled 2024-05-07: qty 10

## 2024-05-07 MED ORDER — SCOPOLAMINE 1 MG/3DAYS TD PT72
MEDICATED_PATCH | TRANSDERMAL | Status: AC
  Filled 2024-05-07: qty 2

## 2024-05-07 MED ORDER — FENTANYL CITRATE (PF) 100 MCG/2ML IJ SOLN
INTRAMUSCULAR | Status: AC
  Filled 2024-05-07: qty 2

## 2024-05-07 MED ORDER — GABAPENTIN 100 MG PO CAPS
100.0000 mg | ORAL_CAPSULE | ORAL | Status: AC
  Administered 2024-05-07: 100 mg via ORAL

## 2024-05-07 MED ORDER — ACETAMINOPHEN 500 MG PO TABS
1000.0000 mg | ORAL_TABLET | ORAL | Status: AC
  Administered 2024-05-07: 1000 mg via ORAL

## 2024-05-07 MED ORDER — PHENYLEPHRINE 80 MCG/ML (10ML) SYRINGE FOR IV PUSH (FOR BLOOD PRESSURE SUPPORT)
PREFILLED_SYRINGE | INTRAVENOUS | Status: DC | PRN
  Administered 2024-05-07: 80 ug via INTRAVENOUS
  Administered 2024-05-07: 120 ug via INTRAVENOUS
  Administered 2024-05-07: 80 ug via INTRAVENOUS

## 2024-05-07 MED ORDER — METHOCARBAMOL 500 MG PO TABS
500.0000 mg | ORAL_TABLET | Freq: Four times a day (QID) | ORAL | Status: DC | PRN
  Administered 2024-05-07: 500 mg via ORAL
  Filled 2024-05-07: qty 1

## 2024-05-07 MED ORDER — ONDANSETRON HCL 4 MG/2ML IJ SOLN
INTRAMUSCULAR | Status: AC
  Filled 2024-05-07: qty 2

## 2024-05-07 MED ORDER — ENOXAPARIN SODIUM 30 MG/0.3ML IJ SOSY: 30.0000 mg | PREFILLED_SYRINGE | INTRAMUSCULAR | Status: DC

## 2024-05-07 MED ORDER — DEXMEDETOMIDINE HCL IN NACL 80 MCG/20ML IV SOLN
INTRAVENOUS | Status: DC | PRN
  Administered 2024-05-07 (×2): 4 ug via INTRAVENOUS

## 2024-05-07 MED ORDER — 0.9 % SODIUM CHLORIDE (POUR BTL) OPTIME
TOPICAL | Status: DC | PRN
  Administered 2024-05-07: 1000 mL

## 2024-05-07 MED ORDER — BUPIVACAINE HCL (PF) 0.25 % IJ SOLN
INTRAMUSCULAR | Status: DC | PRN
  Administered 2024-05-07 (×2): 30 mL via PERINEURAL

## 2024-05-07 MED ORDER — BUPIVACAINE-EPINEPHRINE (PF) 0.25% -1:200000 IJ SOLN
INTRAMUSCULAR | Status: AC
  Filled 2024-05-07: qty 30

## 2024-05-07 MED ORDER — MORPHINE SULFATE (PF) 4 MG/ML IV SOLN: 1.0000 mg | INTRAVENOUS | Status: DC | PRN

## 2024-05-07 MED ORDER — PROPOFOL 500 MG/50ML IV EMUL
INTRAVENOUS | Status: DC | PRN
  Administered 2024-05-07 (×2): 50 ug/kg/min via INTRAVENOUS

## 2024-05-07 MED ORDER — DEXMEDETOMIDINE HCL IN NACL 80 MCG/20ML IV SOLN
INTRAVENOUS | Status: AC
  Filled 2024-05-07: qty 20

## 2024-05-07 MED ORDER — ACETAMINOPHEN 500 MG PO TABS
ORAL_TABLET | ORAL | Status: AC
  Filled 2024-05-07: qty 2

## 2024-05-07 MED ORDER — CEFAZOLIN SODIUM-DEXTROSE 2-4 GM/100ML-% IV SOLN
INTRAVENOUS | Status: AC
  Filled 2024-05-07: qty 100

## 2024-05-07 MED ORDER — FENTANYL CITRATE (PF) 100 MCG/2ML IJ SOLN
INTRAMUSCULAR | Status: DC | PRN
  Administered 2024-05-07 (×2): 50 ug via INTRAVENOUS

## 2024-05-07 MED ORDER — ARTIFICIAL TEARS OPHTHALMIC OINT
TOPICAL_OINTMENT | OPHTHALMIC | Status: AC
  Filled 2024-05-07: qty 3.5

## 2024-05-07 MED ORDER — CEFAZOLIN SODIUM-DEXTROSE 2-4 GM/100ML-% IV SOLN: 2.0000 g | INTRAVENOUS | Status: DC

## 2024-05-07 MED ORDER — LACTATED RINGERS IV SOLN: INTRAVENOUS | Status: DC

## 2024-05-07 MED ORDER — PANTOPRAZOLE SODIUM 40 MG IV SOLR
40.0000 mg | Freq: Every day | INTRAVENOUS | Status: DC
  Filled 2024-05-07: qty 10

## 2024-05-07 MED ORDER — OXYCODONE HCL 5 MG/5ML PO SOLN: 5.0000 mg | Freq: Once | ORAL | Status: DC | PRN

## 2024-05-07 MED ORDER — CEFAZOLIN SODIUM-DEXTROSE 2-3 GM-%(50ML) IV SOLR
INTRAVENOUS | Status: DC | PRN
  Administered 2024-05-07: 2 g via INTRAVENOUS

## 2024-05-07 MED ORDER — OXYCODONE HCL 5 MG PO TABS
5.0000 mg | ORAL_TABLET | ORAL | Status: DC | PRN
  Administered 2024-05-07: 5 mg via ORAL
  Filled 2024-05-07: qty 1

## 2024-05-07 MED ORDER — ONDANSETRON HCL 4 MG/2ML IJ SOLN: 4.0000 mg | Freq: Four times a day (QID) | INTRAMUSCULAR | Status: DC | PRN

## 2024-05-07 MED ORDER — SODIUM CHLORIDE 0.9 % IV SOLN: INTRAVENOUS | Status: DC

## 2024-05-07 MED ORDER — FENTANYL CITRATE (PF) 100 MCG/2ML IJ SOLN
100.0000 ug | Freq: Once | INTRAMUSCULAR | Status: AC
  Administered 2024-05-07: 100 ug via INTRAVENOUS

## 2024-05-07 MED ORDER — ONDANSETRON 4 MG PO TBDP
4.0000 mg | ORAL_TABLET | Freq: Four times a day (QID) | ORAL | Status: DC | PRN
  Administered 2024-05-07: 4 mg via ORAL
  Filled 2024-05-07: qty 1

## 2024-05-07 MED ORDER — ONDANSETRON HCL 4 MG/2ML IJ SOLN
INTRAMUSCULAR | Status: DC | PRN
  Administered 2024-05-07: 4 mg via INTRAVENOUS

## 2024-05-07 MED ORDER — GABAPENTIN 100 MG PO CAPS
ORAL_CAPSULE | ORAL | Status: AC
  Filled 2024-05-07: qty 1

## 2024-05-07 MED ORDER — PROPOFOL 10 MG/ML IV BOLUS
INTRAVENOUS | Status: DC | PRN
  Administered 2024-05-07: 200 ug via INTRAVENOUS

## 2024-05-07 MED ORDER — FENTANYL CITRATE (PF) 100 MCG/2ML IJ SOLN
25.0000 ug | INTRAMUSCULAR | Status: DC | PRN
  Administered 2024-05-07: 50 ug via INTRAVENOUS

## 2024-05-07 MED ORDER — TECHNETIUM TC 99M TILMANOCEPT KIT
1.0000 | PACK | Freq: Once | INTRAVENOUS | Status: AC
  Administered 2024-05-07: 1.1 via INTRADERMAL

## 2024-05-07 SURGICAL SUPPLY — 42 items
BINDER BREAST LRG (GAUZE/BANDAGES/DRESSINGS) IMPLANT
BINDER BREAST XLRG (GAUZE/BANDAGES/DRESSINGS) IMPLANT
BINDER BREAST XXLRG (GAUZE/BANDAGES/DRESSINGS) IMPLANT
BIOPATCH RED 1 DISK 7.0 (GAUZE/BANDAGES/DRESSINGS) ×2 IMPLANT
BLADE SURG 10 STRL SS (BLADE) ×2 IMPLANT
BLADE SURG 15 STRL LF DISP TIS (BLADE) ×2 IMPLANT
CANISTER SUCT 1200ML W/VALVE (MISCELLANEOUS) ×2 IMPLANT
CHLORAPREP W/TINT 26 (MISCELLANEOUS) ×2 IMPLANT
CLIP APPLIE 9.375 MED OPEN (MISCELLANEOUS) ×2 IMPLANT
COVER BACK TABLE 60X90IN (DRAPES) ×2 IMPLANT
COVER MAYO STAND STRL (DRAPES) ×2 IMPLANT
COVER PROBE CYLINDRICAL 5X96 (MISCELLANEOUS) ×2 IMPLANT
DERMABOND ADVANCED .7 DNX12 (GAUZE/BANDAGES/DRESSINGS) ×2 IMPLANT
DEVICE DSSCT PLSMBLD 3.0S LGHT (MISCELLANEOUS) ×2 IMPLANT
DRAIN CHANNEL 19F RND (DRAIN) ×2 IMPLANT
DRAPE LAPAROSCOPIC ABDOMINAL (DRAPES) ×2 IMPLANT
DRAPE UTILITY XL STRL (DRAPES) ×2 IMPLANT
DRSG TEGADERM 2-3/8X2-3/4 SM (GAUZE/BANDAGES/DRESSINGS) ×2 IMPLANT
ELECT COATED BLADE 2.86 ST (ELECTRODE) IMPLANT
ELECTRODE REM PT RTRN 9FT ADLT (ELECTROSURGICAL) ×2 IMPLANT
EVACUATOR SILICONE 100CC (DRAIN) ×2 IMPLANT
GAUZE PAD ABD 8X10 STRL (GAUZE/BANDAGES/DRESSINGS) ×2 IMPLANT
GAUZE SPONGE 4X4 12PLY STRL LF (GAUZE/BANDAGES/DRESSINGS) ×2 IMPLANT
GLOVE BIO SURGEON STRL SZ7.5 (GLOVE) ×2 IMPLANT
GOWN STRL REUS W/ TWL LRG LVL3 (GOWN DISPOSABLE) ×4 IMPLANT
NDL HYPO 25X1 1.5 SAFETY (NEEDLE) ×2 IMPLANT
PACK BASIN DAY SURGERY FS (CUSTOM PROCEDURE TRAY) ×2 IMPLANT
PENCIL SMOKE EVACUATOR (MISCELLANEOUS) IMPLANT
PIN SAFETY STERILE (MISCELLANEOUS) ×2 IMPLANT
SLEEVE SCD COMPRESS KNEE MED (STOCKING) ×2 IMPLANT
SOLN 0.9% NACL POUR BTL 1000ML (IV SOLUTION) ×2 IMPLANT
SPIKE FLUID TRANSFER (MISCELLANEOUS) IMPLANT
SPONGE T-LAP 18X18 ~~LOC~~+RFID (SPONGE) ×2 IMPLANT
SUT ETHILON 2 0 FS 18 (SUTURE) ×2 IMPLANT
SUT MNCRL AB 4-0 PS2 18 (SUTURE) ×2 IMPLANT
SUT SILK 2 0 SH (SUTURE) IMPLANT
SUT VICRYL 3-0 CR8 SH (SUTURE) ×2 IMPLANT
SYR CONTROL 10ML LL (SYRINGE) ×2 IMPLANT
TOWEL GREEN STERILE FF (TOWEL DISPOSABLE) ×2 IMPLANT
TRAY FOLEY W/BAG SLVR 14FR LF (SET/KITS/TRAYS/PACK) IMPLANT
TUBE CONNECTING 20X1/4 (TUBING) ×2 IMPLANT
YANKAUER SUCT BULB TIP NO VENT (SUCTIONS) ×2 IMPLANT

## 2024-05-07 NOTE — Op Note (Signed)
 05/07/2024  10:55 AM  PATIENT:  Gabrielle Cross  58 y.o. female  PRE-OPERATIVE DIAGNOSIS:  RIGHT BREAST CANCER  POST-OPERATIVE DIAGNOSIS:  RIGHT BREAST CANCER  PROCEDURE:  Procedure(s) with comments: RIGHT MASTECTOMY WITH DEEP RIGHT AXILLARY SENTINEL LYMPH NODE BIOPSY  LEFT PROPHYLACTIC MASTECTOMY  SURGEON:  Surgeons and Role:    * Curvin Deward MOULD, MD - Primary  PHYSICIAN ASSISTANT:   ASSISTANTS: none   ANESTHESIA:   general  EBL:  Minimal   BLOOD ADMINISTERED:none  DRAINS: (2) Blake drain(s) in the prepectoral space   LOCAL MEDICATIONS USED:  NONE  SPECIMEN:  Source of Specimen:  right mastectomy and sentinel node x 2, left mastectomy  DISPOSITION OF SPECIMEN:  PATHOLOGY  COUNTS:  YES  TOURNIQUET:  * No tourniquets in log *  DICTATION: .Dragon Dictation  After informed consent was obtained the patient was brought to the waiting room and placed in the supine position on the operating table.  After adequate induction of general anesthesia the patient's bilateral chest, breast, and axillary areas were prepped with ChloraPrep, allowed to dry, and draped in usual sterile manner.  An appropriate timeout was performed.  Earlier today the patient underwent injection 1 mCi of sulfur colloid in the subareolar position on the right.  Attention was first turned to the left breast.  An elliptical incision was made and was applied areola complex in order to minimize the excess skin with a 10 blade knife.  The incision was carried through the skin subcutaneous tissue sharply with the PlasmaBlade.  Breast hooks were used to elevate the skin flaps anteriorly towards the ceiling.  Then skin flaps were then created by dissecting between the breast tissue and the subcutaneous fat and skin.  Next the breast was removed the drain was anchored to the skin with a 3-0 nylon stitch.  Next the superior and inferior skin flaps were grossly reapproximated with interrupted 3-0 Vicryl stitches.  The skin  was closed with a running 4-0 Monocryl subcuticular stitch.  attention was then turned to the right breast.  Then the neoprobe was set to technetium and a good signal was seen in the right axilla.  A elliptical incision was made around the nipple areolar complex in order to minimize the excess skin. The incision was carried throught the skin and subcutaneous tissue sharply with the plasma blade.  Dissection was then carried out between the breast tissue and the subcutaneous fat and skin sharply with the PlasmaBlade.  This dissection was carried circumferentially all the way to the chest wall muscle.  Next the breast was removed from the pectoralis muscle with the pectoralis fascia.  Once this dissection was complete then the entire right breast was removed from the patient.  It was oriented with a stitch on the lateral skin.  The breast was then sent to pathology for further evaluation.  The neoprobe was then used to direct blunt hemostat dissection into the deep right axillary space.  I was able to identify 1 lymph node with signal.  This lymph node was excised sharply with the PlasmaBlade and the surrounding small vessels and lymphatics were controlled with clips.  Ex vivo counts on this node were approximately 3000.  A second palpable node was also identified and this was also excised sharply with the PlasmaBlade and the surrounding small vessels and lymphatics were controlled with clips.  These were sent as sentinel nodes numbers 1 and 2.  No other hot or palpable nodes were identified in the right axilla.  Hemostasis was achieved using the PlasmaBlade.  The wound was irrigated with copious amounts of saline.  Next a small stab incision was made near the anterior axillary line inferior to the operative bed.  A tonsil clamp was placed through this opening and used to bring a 19 French round Blake drain into the operative bed.  The drain was curled along the chest wall.  The drain was anchored to the skin with a  3-0 nylon stitch.  Next the superior and inferior skin flaps were grossly reapproximated with interrupted 3-0 Vicryl stitches.  The skin was then closed with a running 4-0 Monocryl subcuticular stitch.  Dermabond dressings and sterile drain dressings were applied.  The patient tolerated the procedure well.  At the end of the case all needle sponge and instrument counts were correct.  The drains were placed to bulb suction and there was a good seal.  The patient was then awakened and taken to recovery in stable condition.  The patient's skin flaps were healthy appearing.  PLAN OF CARE: Admit for overnight observation  PATIENT DISPOSITION:  PACU - hemodynamically stable.   Delay start of Pharmacological VTE agent (>24hrs) due to surgical blood loss or risk of bleeding: no

## 2024-05-07 NOTE — Anesthesia Procedure Notes (Signed)
 Anesthesia Regional Block: Pectoralis block   Pre-Anesthetic Checklist: , timeout performed,  Correct Patient, Correct Site, Correct Laterality,  Correct Procedure, Correct Position, site marked,  Risks and benefits discussed,  Surgical consent,  Pre-op evaluation,  At surgeon's request and post-op pain management  Laterality: Left  Prep: chloraprep       Needles:  Injection technique: Single-shot  Needle Type: Echogenic Stimulator Needle     Needle Length: 10cm  Needle Gauge: 20     Additional Needles:   Procedures:,,,, ultrasound used (permanent image in chart),,    Narrative:  Start time: 05/07/2024 7:55 AM End time: 05/07/2024 8:05 AM Injection made incrementally with aspirations every 5 mL.  Performed by: Personally  Anesthesiologist: Patrisha Bernardino SQUIBB, MD  Additional Notes: Functioning IV was confirmed and monitors were applied.  A timeout was performed. Sterile prep, hand hygiene and sterile gloves were used. A 20ga Bbraun echogenic stimulator needle was used. Negative aspiration and negative test dose prior to incremental administration of local anesthetic. The patient tolerated the procedure well.  Ultrasound guidance: relevent anatomy identified, needle position confirmed, local anesthetic spread visualized around nerve(s), vascular puncture avoided.  Image printed for medical record.

## 2024-05-07 NOTE — Anesthesia Procedure Notes (Signed)
 Procedure Name: LMA Insertion Date/Time: 05/07/2024 8:36 AM  Performed by: Leotha Andrez DEL, CRNAPre-anesthesia Checklist: Patient identified, Emergency Drugs available, Suction available, Patient being monitored and Timeout performed Patient Re-evaluated:Patient Re-evaluated prior to induction Oxygen Delivery Method: Circle system utilized Preoxygenation: Pre-oxygenation with 100% oxygen Induction Type: IV induction Ventilation: Mask ventilation without difficulty LMA: LMA with gastric port inserted LMA Size: 4.0 Number of attempts: 1 Placement Confirmation: breath sounds checked- equal and bilateral and positive ETCO2 Tube secured with: Tape Dental Injury: Teeth and Oropharynx as per pre-operative assessment

## 2024-05-07 NOTE — Anesthesia Postprocedure Evaluation (Signed)
 Anesthesia Post Note  Patient: Gabrielle Cross  Procedure(s) Performed: MASTECTOMY WITH SENTINEL LYMPH NODE BIOPSY (Right: Breast) MASTECTOMY, SIMPLE (Left: Breast)     Patient location during evaluation: PACU Anesthesia Type: Regional and General Level of consciousness: awake Pain management: pain level controlled Vital Signs Assessment: post-procedure vital signs reviewed and stable Respiratory status: spontaneous breathing, nonlabored ventilation and respiratory function stable Cardiovascular status: blood pressure returned to baseline and stable Postop Assessment: no apparent nausea or vomiting Anesthetic complications: no   No notable events documented.  Last Vitals:  Vitals:   05/07/24 1200 05/07/24 1252  BP: (!) 145/81 120/69  Pulse: 74 71  Resp: 11 14  Temp:  (!) 36.3 C  SpO2: 93% 98%    Last Pain:  Vitals:   05/07/24 1252  TempSrc:   PainSc: 4                  Bonner Larue P Charissa Knowles

## 2024-05-07 NOTE — Anesthesia Preprocedure Evaluation (Addendum)
 Anesthesia Evaluation  Patient identified by MRN, date of birth, ID band Patient awake    Reviewed: Allergy & Precautions, NPO status , Patient's Chart, lab work & pertinent test results  Airway Mallampati: II  TM Distance: >3 FB Neck ROM: Full    Dental no notable dental hx.    Pulmonary sleep apnea    Pulmonary exam normal        Cardiovascular hypertension, Pt. on medications Normal cardiovascular exam     Neuro/Psych  Headaches    GI/Hepatic negative GI ROS, Neg liver ROS,,,  Endo/Other  negative endocrine ROS    Renal/GU negative Renal ROS     Musculoskeletal   Abdominal  (+) + obese  Peds  Hematology negative hematology ROS (+)   Anesthesia Other Findings RIGHT BREAST CANCER  Reproductive/Obstetrics                              Anesthesia Physical Anesthesia Plan  ASA: 3  Anesthesia Plan: General and Regional   Post-op Pain Management: Regional block*   Induction: Intravenous  PONV Risk Score and Plan: 3 and Ondansetron , Dexamethasone , Midazolam  and Treatment may vary due to age or medical condition  Airway Management Planned: LMA  Additional Equipment:   Intra-op Plan:   Post-operative Plan: Extubation in OR  Informed Consent: I have reviewed the patients History and Physical, chart, labs and discussed the procedure including the risks, benefits and alternatives for the proposed anesthesia with the patient or authorized representative who has indicated his/her understanding and acceptance.     Dental advisory given  Plan Discussed with: CRNA  Anesthesia Plan Comments:          Anesthesia Quick Evaluation

## 2024-05-07 NOTE — Discharge Instructions (Signed)
 About my Jackson-Pratt Bulb Drain  What is a Jackson-Pratt bulb? A Jackson-Pratt is a soft, round device used to collect drainage. It is connected to a long, thin drainage catheter, which is held in place by one or two small stiches near your surgical incision site. When the bulb is squeezed, it forms a vacuum, forcing the drainage to empty into the bulb.  Emptying the Jackson-Pratt bulb- To empty the bulb: 1. Release the plug on the top of the bulb. 2. Pour the bulb's contents into a measuring container which your nurse will provide. 3. Record the time emptied and amount of drainage. Empty the drain(s) as often as your     doctor or nurse recommends.  Date                  Time                    Amount (Drain right)                 Amount (Drain left)  _____________________________________________________________________  _____________________________________________________________________  _____________________________________________________________________  _____________________________________________________________________  _____________________________________________________________________  _____________________________________________________________________  _____________________________________________________________________  _____________________________________________________________________  Squeezing the Jackson-Pratt Bulb- To squeeze the bulb: 1. Make sure the plug at the top of the bulb is open. 2. Squeeze the bulb tightly in your fist. You will hear air squeezing from the bulb. 3. Replace the plug while the bulb is squeezed. 4. Use a safety pin to attach the bulb to your clothing. This will keep the catheter from     pulling at the bulb insertion site.  When to call your doctor- Call your doctor if: Drain site becomes red, swollen or hot. You have a fever greater than 101 degrees F. There is oozing at the drain site. Drain falls out (apply a guaze  bandage over the drain hole and secure it with tape). Drainage increases daily not related to activity patterns. (You will usually have more drainage when you are active than when you are resting.) Drainage has a bad odor.

## 2024-05-07 NOTE — Progress Notes (Signed)
 Assisted Dr. Bradley Ferris with left, right, pectoralis, ultrasound guided block. Side rails up, monitors on throughout procedure. See vital signs in flow sheet. Tolerated Procedure well. ?

## 2024-05-07 NOTE — Interval H&P Note (Signed)
 History and Physical Interval Note:  05/07/2024 7:56 AM  Gabrielle Cross  has presented today for surgery, with the diagnosis of RIGHT BREAST CANCER.  The various methods of treatment have been discussed with the patient and family. After consideration of risks, benefits and other options for treatment, the patient has consented to  Procedure(s) with comments: MASTECTOMY WITH SENTINEL LYMPH NODE BIOPSY (Right) - GEN w/PEC BLOCK RIGHT MASTECTOMY SENTINEL NODE BIOPSY MASTECTOMY, SIMPLE (Left) - LEFT PROPHYLACTIC MASTECTOMY as a surgical intervention.  The patient's history has been reviewed, patient examined, no change in status, stable for surgery.  I have reviewed the patient's chart and labs.  Questions were answered to the patient's satisfaction.     [name]

## 2024-05-07 NOTE — Anesthesia Procedure Notes (Signed)
 Anesthesia Regional Block: Pectoralis block   Pre-Anesthetic Checklist: , timeout performed,  Correct Patient, Correct Site, Correct Laterality,  Correct Procedure, Correct Position, site marked,  Risks and benefits discussed,  Surgical consent,  Pre-op evaluation,  At surgeon's request and post-op pain management  Laterality: Right  Prep: chloraprep       Needles:  Injection technique: Single-shot  Needle Type: Echogenic Stimulator Needle     Needle Length: 10cm  Needle Gauge: 20     Additional Needles:   Procedures:,,,, ultrasound used (permanent image in chart),,    Narrative:  Start time: 05/07/2024 8:05 AM End time: 05/07/2024 8:15 AM Injection made incrementally with aspirations every 5 mL.  Performed by: Personally  Anesthesiologist: Patrisha Bernardino SQUIBB, MD  Additional Notes: Functioning IV was confirmed and monitors were applied.  A timeout was performed. Sterile prep, hand hygiene and sterile gloves were used. A 20ga Bbaun  echogenic stimulator needle was used. Negative aspiration and negative test dose prior to incremental administration of local anesthetic. The patient tolerated the procedure well.  Ultrasound guidance: relevent anatomy identified, needle position confirmed, local anesthetic spread visualized around nerve(s), vascular puncture avoided.  Image printed for medical record.

## 2024-05-07 NOTE — Transfer of Care (Signed)
 Immediate Anesthesia Transfer of Care Note  Patient: Gabrielle Cross  Procedure(s) Performed: MASTECTOMY WITH SENTINEL LYMPH NODE BIOPSY (Right: Breast) MASTECTOMY, SIMPLE (Left: Breast)  Patient Location: PACU  Anesthesia Type:General  Level of Consciousness: drowsy and patient cooperative  Airway & Oxygen Therapy: Patient Spontanous Breathing and Patient connected to face mask oxygen  Post-op Assessment: Report given to RN and Post -op Vital signs reviewed and stable  Post vital signs: Reviewed and stable  Last Vitals:  Vitals Value Taken Time  BP 130/73 05/07/24 11:15  Temp    Pulse 78 05/07/24 11:22  Resp 14 05/07/24 11:22  SpO2 100 % 05/07/24 11:22  Vitals shown include unfiled device data.  Last Pain:  Vitals:   05/07/24 0635  TempSrc: Temporal  PainSc: 0-No pain         Complications: No notable events documented.

## 2024-05-07 NOTE — H&P (Signed)
 REFERRING PHYSICIAN: Ginette Cozier PROVIDER: DEWARD GARNETTE NULL, MD MRN: [id number] DOB: Sep 19, 1965 Subjective    Chief Complaint: RE-CHECK ( breast checck, fin chemo MR 10/31/)  History of Present Illness: Gabrielle Cross is a 58 y.o. female who is seen today as an office consultation for evaluation of RE-CHECK ( breast checck, fin chemo MR 10/31/)  We are asked to see the patient in consultation by Dr. Odean to evaluate her for a new right breast cancer. The patient is a 58 year old female who recently went for a routine screening mammogram. It has been several years since she has had a mammogram. She was found to have a 5.1 cm mass in the central portion of the right breast. There was 1 abnormal lymph node which was biopsied and benign. The mass was biopsied and came back as a grade 3 invasive ductal cancer that was weakly ER positive and PR negative and HER2 negative with a Ki-67 of 60%. She does have some hypertension but does not smoke. She finished neoadjuvant chemotherapy on October 29. Her recent MRI showed that the cancer shrunk from 5.1 to 3.5 cm. Her lymph nodes looked normal.  Review of Systems: A complete review of systems was obtained from the patient. I have reviewed this information and discussed as appropriate with the patient. See HPI as well for other ROS.  ROS   Medical History: Past Medical History:  Diagnosis Date  Arthritis  Hyperlipidemia   Patient Active Problem List  Diagnosis  Malignant neoplasm of upper-outer quadrant of right breast in female, estrogen receptor positive (CMS/HHS-HCC)  Malignant neoplasm of central portion of right breast in female, estrogen receptor negative (CMS/HHS-HCC)   Past Surgical History:  Procedure Laterality Date  Dilatation and Curettage/hysteroscopy N/A 04/19/2022  .Right Breast Biopsy Right 10/07/2023    Allergies  Allergen Reactions  Shellfish Containing Products Diarrhea   Current Outpatient Medications on File  Prior to Visit  Medication Sig Dispense Refill  cholecalciferol  (VITAMIN D3) 2,000 unit tablet Take 2,000 Units by mouth once daily  FLUoxetine  (PROZAC ) 10 MG capsule Take 10 mg by mouth once daily  hydroCHLOROthiazide  (HYDRODIURIL ) 12.5 MG tablet Take 12.5 mg by mouth once daily  progesterone  (PROMETRIUM ) 100 MG capsule Take 100 mg by mouth once daily  rizatriptan  (MAXALT ) 10 MG tablet Take 10 mg by mouth once as needed for Migraine   No current facility-administered medications on file prior to visit.   Family History  Problem Relation Age of Onset  Hyperlipidemia (Elevated cholesterol) Father    Social History   Tobacco Use  Smoking Status Never  Smokeless Tobacco Never    Social History   Socioeconomic History  Marital status: Married  Tobacco Use  Smoking status: Never  Smokeless tobacco: Never  Vaping Use  Vaping status: Never Used  Substance and Sexual Activity  Alcohol use: Yes  Comment: Socially  Drug use: Never   Social Drivers of Corporate Investment Banker Strain: Low Risk (12/08/2023)  Received from [facility]  Overall Financial Resource Strain (CARDIA)  How hard is it for you to pay for the very basics like food, housing, medical care, and heating?: Not hard at all  Food Insecurity: No Food Insecurity (12/08/2023)  Received from [facility]  Hunger Vital Sign  Within the past 12 months, you worried that your food would run out before you got the money to buy more.: Never true  Within the past 12 months, the food you bought just didn't last and you didn't have money  to get more.: Never true  Transportation Needs: No Transportation Needs (12/08/2023)  Received from [facility] - Transportation  In the past 12 months, has lack of transportation kept you from medical appointments or from getting medications?: No  In the past 12 months, has lack of transportation kept you from meetings, work, or from getting things needed for daily living?: No   Physical Activity: Insufficiently Active (12/08/2023)  Received from [facility]  Exercise Vital Sign  On average, how many days per week do you engage in moderate to strenuous exercise (like a brisk walk)?: 1 day  On average, how many minutes do you engage in exercise at this level?: 30 min  Stress: No Stress Concern Present (12/08/2023)  Received from [facility] - Occupational Stress Questionnaire  Do you feel stress - tense, restless, nervous, or anxious, or unable to sleep at night because your mind is troubled all the time - these days?: Not at all  Social Connections: Unknown (12/08/2023)  Received from [facility]  Social Connection and Isolation Panel  In a typical week, how many times do you talk on the phone with family, friends, or neighbors?: Once a week  How often do you get together with friends or relatives?: Patient declined  How often do you attend church or religious services?: Patient declined  Do you belong to any clubs or organizations such as church groups, unions, fraternal or athletic groups, or school groups?: No  Are you married, widowed, divorced, separated, never married, or living with a partner?: Married  Housing Stability: Unknown (04/06/2024)  Housing Stability Vital Sign  Homeless in the Last Year: No   Objective:   Vitals:  Pulse: 97  Resp: 16  Temp: 36.2 C (97.2 F)  SpO2: 97%  Weight: 77 kg (169 lb 12.8 oz)  Height: 152.4 cm (5')  PainSc: 0-No pain   Body mass index is 33.16 kg/m.  Physical Exam Vitals reviewed.  Constitutional:  General: She is not in acute distress. Appearance: Normal appearance.  HENT:  Head: Normocephalic and atraumatic.  Right Ear: External ear normal.  Left Ear: External ear normal.  Nose: Nose normal.  Mouth/Throat:  Mouth: Mucous membranes are moist.  Pharynx: Oropharynx is clear.  Eyes:  General: No scleral icterus. Extraocular Movements: Extraocular movements intact.   Conjunctiva/sclera: Conjunctivae normal.  Pupils: Pupils are equal, round, and reactive to light.  Cardiovascular:  Rate and Rhythm: Normal rate and regular rhythm.  Pulses: Normal pulses.  Heart sounds: Normal heart sounds.  Pulmonary:  Effort: Pulmonary effort is normal. No respiratory distress.  Breath sounds: Normal breath sounds.  Abdominal:  General: Bowel sounds are normal.  Palpations: Abdomen is soft.  Tenderness: There is no abdominal tenderness.  Musculoskeletal:  General: No swelling, tenderness or deformity. Normal range of motion.  Cervical back: Normal range of motion and neck supple.  Skin: General: Skin is warm and dry.  Coloration: Skin is not jaundiced.  Neurological:  General: No focal deficit present.  Mental Status: She is alert and oriented to person, place, and time.  Psychiatric:  Mood and Affect: Mood normal.  Behavior: Behavior normal.     Breast: It is difficult to palpate the tumor in the right breast. There is some skin puckering of the upper areola. There is no palpable mass in the left breast. There is no palpable axillary, supraclavicular, or or cervical lymphadenopathy.  Labs, Imaging and Diagnostic Testing:  Assessment and Plan:  Diagnoses and all orders for this visit:  Malignant neoplasm of upper-outer quadrant of right breast in female, estrogen receptor positive (CMS/HHS-HCC) - [facility] Case Posting Request; Future   The patient appears to have a 5.1 cm mass in the central portion of the right breast with clinically negative nodes and is essentially triple negative. The tumor has shrunk some with neoadjuvant chemotherapy. She is now ready to schedule her definitive surgery. She has elected for bilateral mastectomies with right sentinel lymph node biopsy. She does not want reconstruction. I have discussed with her in detail the risks and benefits of the operation as well as some of the technical aspects including skin flap necrosis and she  understands and wishes to proceed. We will move forward with surgical scheduling

## 2024-05-08 ENCOUNTER — Encounter (HOSPITAL_BASED_OUTPATIENT_CLINIC_OR_DEPARTMENT_OTHER): Payer: Self-pay | Admitting: General Surgery

## 2024-05-08 ENCOUNTER — Other Ambulatory Visit (HOSPITAL_COMMUNITY): Payer: Self-pay

## 2024-05-08 MED ORDER — OXYCODONE HCL 5 MG PO TABS
5.0000 mg | ORAL_TABLET | Freq: Four times a day (QID) | ORAL | 0 refills | Status: DC | PRN
  Filled 2024-05-08: qty 10, 3d supply, fill #0

## 2024-05-08 MED ORDER — METHOCARBAMOL 500 MG PO TABS
500.0000 mg | ORAL_TABLET | Freq: Four times a day (QID) | ORAL | 2 refills | Status: DC | PRN
  Filled 2024-05-08: qty 30, 8d supply, fill #0

## 2024-05-08 NOTE — Progress Notes (Signed)
 1 Day Post-Op   Subjective/Chief Complaint: No complaints   Objective: Vital signs in last 24 hours: Temp:  [97.1 F (36.2 C)-98.4 F (36.9 C)] 98.2 F (36.8 C) (12/06 0645) Pulse Rate:  [67-87] 81 (12/06 0645) Resp:  [5-22] 16 (12/06 0645) BP: (92-151)/(52-91) 119/67 (12/06 0645) SpO2:  [93 %-100 %] 100 % (12/06 0645)    Intake/Output from previous day: 12/05 0701 - 12/06 0700 In: 2360 [P.O.:960; I.V.:1400] Out: 780 [Urine:500; Drains:255; Blood:25] Intake/Output this shift: No intake/output data recorded.  General appearance: alert and cooperative Resp: clear to auscultation bilaterally Chest wall: skin flaps look good Cardio: regular rate and rhythm GI: soft, non-tender; bowel sounds normal; no masses,  no organomegaly  Lab Results:  No results for input(s): WBC, HGB, HCT, PLT in the last 72 hours. BMET No results for input(s): NA, K, CL, CO2, GLUCOSE, BUN, CREATININE, CALCIUM  in the last 72 hours. PT/INR No results for input(s): LABPROT, INR in the last 72 hours. ABG No results for input(s): PHART, HCO3 in the last 72 hours.  Invalid input(s): PCO2, PO2  Studies/Results: NM Sentinel Node Inj-No Rpt (Breast) Result Date: 05/07/2024 Lymphoseek was injected by the Nuclear Medicine Technologist for sentinel lymph node localization.    Anti-infectives: Anti-infectives (From admission, onward)    Start     Dose/Rate Route Frequency Ordered Stop   05/07/24 0630  ceFAZolin  (ANCEF ) IVPB 2g/100 mL premix  Status:  Discontinued        2 g 200 mL/hr over 30 Minutes Intravenous On call to O.R. 05/07/24 0619 05/07/24 1243       Assessment/Plan: s/p Procedure(s) with comments: MASTECTOMY WITH SENTINEL LYMPH NODE BIOPSY (Right) - GEN w/PEC BLOCK RIGHT MASTECTOMY SENTINEL NODE BIOPSY MASTECTOMY, SIMPLE (Left) - LEFT PROPHYLACTIC MASTECTOMY Advance diet Discharge Teach pt drain care  LOS: 0 days    Deward Null  III 05/08/2024

## 2024-05-10 LAB — SURGICAL PATHOLOGY

## 2024-05-11 ENCOUNTER — Ambulatory Visit: Payer: Self-pay | Admitting: General Surgery

## 2024-05-12 ENCOUNTER — Inpatient Hospital Stay: Admitting: Hematology and Oncology

## 2024-05-12 ENCOUNTER — Inpatient Hospital Stay

## 2024-05-13 ENCOUNTER — Inpatient Hospital Stay

## 2024-05-13 ENCOUNTER — Encounter: Payer: Self-pay | Admitting: *Deleted

## 2024-05-13 ENCOUNTER — Inpatient Hospital Stay (HOSPITAL_BASED_OUTPATIENT_CLINIC_OR_DEPARTMENT_OTHER): Admitting: Hematology and Oncology

## 2024-05-13 ENCOUNTER — Inpatient Hospital Stay: Attending: Hematology and Oncology

## 2024-05-13 VITALS — BP 118/78 | HR 76 | Temp 98.5°F | Resp 18 | Ht 61.0 in | Wt 164.2 lb

## 2024-05-13 DIAGNOSIS — C50411 Malignant neoplasm of upper-outer quadrant of right female breast: Secondary | ICD-10-CM

## 2024-05-13 DIAGNOSIS — Z170 Estrogen receptor positive status [ER+]: Secondary | ICD-10-CM | POA: Diagnosis not present

## 2024-05-13 DIAGNOSIS — Z1722 Progesterone receptor negative status: Secondary | ICD-10-CM | POA: Diagnosis not present

## 2024-05-13 DIAGNOSIS — Z79899 Other long term (current) drug therapy: Secondary | ICD-10-CM | POA: Insufficient documentation

## 2024-05-13 DIAGNOSIS — Z1732 Human epidermal growth factor receptor 2 negative status: Secondary | ICD-10-CM | POA: Insufficient documentation

## 2024-05-13 DIAGNOSIS — Z5112 Encounter for antineoplastic immunotherapy: Secondary | ICD-10-CM | POA: Insufficient documentation

## 2024-05-13 LAB — CBC WITH DIFFERENTIAL (CANCER CENTER ONLY)
Abs Immature Granulocytes: 0.01 K/uL (ref 0.00–0.07)
Basophils Absolute: 0 K/uL (ref 0.0–0.1)
Basophils Relative: 0 %
Eosinophils Absolute: 1 K/uL — ABNORMAL HIGH (ref 0.0–0.5)
Eosinophils Relative: 15 %
HCT: 37 % (ref 36.0–46.0)
Hemoglobin: 12 g/dL (ref 12.0–15.0)
Immature Granulocytes: 0 %
Lymphocytes Relative: 20 %
Lymphs Abs: 1.3 K/uL (ref 0.7–4.0)
MCH: 27.8 pg (ref 26.0–34.0)
MCHC: 32.4 g/dL (ref 30.0–36.0)
MCV: 85.6 fL (ref 80.0–100.0)
Monocytes Absolute: 0.6 K/uL (ref 0.1–1.0)
Monocytes Relative: 10 %
Neutro Abs: 3.6 K/uL (ref 1.7–7.7)
Neutrophils Relative %: 55 %
Platelet Count: 289 K/uL (ref 150–400)
RBC: 4.32 MIL/uL (ref 3.87–5.11)
RDW: 14.8 % (ref 11.5–15.5)
WBC Count: 6.5 K/uL (ref 4.0–10.5)
nRBC: 0 % (ref 0.0–0.2)

## 2024-05-13 LAB — CMP (CANCER CENTER ONLY)
ALT: 25 U/L (ref 0–44)
AST: 20 U/L (ref 15–41)
Albumin: 4.2 g/dL (ref 3.5–5.0)
Alkaline Phosphatase: 91 U/L (ref 38–126)
Anion gap: 11 (ref 5–15)
BUN: 13 mg/dL (ref 6–20)
CO2: 25 mmol/L (ref 22–32)
Calcium: 9.8 mg/dL (ref 8.9–10.3)
Chloride: 102 mmol/L (ref 98–111)
Creatinine: 0.68 mg/dL (ref 0.44–1.00)
GFR, Estimated: >60 mL/min (ref >60)
Glucose, Bld: 101 mg/dL — ABNORMAL HIGH (ref 70–99)
Potassium: 3.9 mmol/L (ref 3.5–5.1)
Sodium: 139 mmol/L (ref 135–145)
Total Bilirubin: 0.3 mg/dL (ref 0.0–1.2)
Total Protein: 7.3 g/dL (ref 6.5–8.1)

## 2024-05-13 MED ORDER — PEMBROLIZUMAB CHEMO INJECTION 100 MG/4ML
200.0000 mg | Freq: Once | INTRAVENOUS | Status: AC
  Administered 2024-05-13: 200 mg via INTRAVENOUS
  Filled 2024-05-13: qty 200

## 2024-05-13 NOTE — Assessment & Plan Note (Signed)
° °  10/07/2023:Screening mammogram detected right breast mass at 12:00 measuring 5.1 cm, 1 axillary lymph node biopsy was benign biopsy of the mass grade 3 IDC with necrosis ER 30% weak, PR 0%, Ki67 60%, HER2 negative   Treatment plan: Neoadjuvant chemotherapy with Taxol  carbo Keytruda  followed by Adriamycin  Cytoxan  Keytruda  11/06/2023-03/11/2024 05/07/2024: Bilateral mastectomies: Left breast: Benign, right mastectomy: Benign no residual cancer, 0/3 lymph nodes negative (pathologic complete response) Adjuvant radiation therapy Antiestrogen therapy CT CAP and bone scan 11/04/2023: 4.7 cm right breast mass, right axillary lymph node 1.8 cm, 0.2 cm lung nodule benign Breast MRI 10/30/2023: Right breast malignancy 4.7 cm, enlarged right axillary lymph node ------------------------------------------------------------------------------------------------------------------------------------------------ Current treatment: Keytruda  maintenance Discussed with the patient about participating in optimize PCR clinical trial which randomizes patients between immunotherapy versus surveillance for those who had achieved a complete pathological response to neoadjuvant chemotherapy.

## 2024-05-13 NOTE — Progress Notes (Signed)
 Patient Care Team: [name], NP as PCP - General (Obstetrics and Gynecology) Odean Potts, MD as Consulting Physician (Hematology and Oncology) Tyree Nanetta SAILOR, RN as Oncology Nurse Navigator Curvin Deward MOULD, MD as Consulting Physician (General Surgery) Izell Domino, MD as Attending Physician (Radiation Oncology)  DIAGNOSIS:  Encounter Diagnosis  Name Primary?   Malignant neoplasm of upper-outer quadrant of right breast in female, estrogen receptor positive (HCC) Yes    SUMMARY OF ONCOLOGIC HISTORY: Oncology History  Malignant neoplasm of upper-outer quadrant of right breast in female, estrogen receptor positive (HCC)  10/07/2023 Initial Diagnosis   Screening mammogram detected right breast mass at 12:00 measuring 5.1 cm, 1 axillary lymph node biopsy was benign biopsy of the mass grade 3 IDC with necrosis ER 30% weak, PR 0%, Ki67 60%, HER2 negative   10/22/2023 Cancer Staging   Staging form: Breast, AJCC 8th Edition - Clinical stage from 10/22/2023: Stage IIIA (cT3, cN0, cM0, G3, ER+, PR-, HER2-) - Signed by Odean Potts, MD on 10/22/2023 Stage prefix: Initial diagnosis Histologic grading system: 3 grade system Laterality: Right Staged by: Pathologist and managing physician Stage used in treatment planning: Yes National guidelines used in treatment planning: Yes Type of national guideline used in treatment planning: NCCN   11/02/2023 Genetic Testing   Negative [facility] CancerNext +RNAinsight Panel.  VUS in BARD1 at p.K438E (c.1312A>G).  Report date is 11/02/2023.   The [employer] CancerNext+RNAinsight Panel includes sequencing, rearrangement analysis, and RNA analysis for the following 40 genes: APC, ATM, BAP1, BARD1, BMPR1A, BRCA1, BRCA2, BRIP1, CDH1, CDKN2A, CHEK2, FH, FLCN, MET, MLH1, MSH2, MSH6, MUTYH, NF1, NTHL1, PALB2, PMS2, PTEN, RAD51C, RAD51D, RSP20, SMAD4, STK11, TP53, TSC1, TSC2, and VHL (sequencing and deletion/duplication); AXIN2, HOXB13, MBD4, MSH3, POLD1 and POLE  (sequencing only); EPCAM and GREM1 (deletion/duplication only).   11/06/2023 - 03/31/2024 Chemotherapy   Patient is on Treatment Plan : BREAST Pembrolizumab  (200) D1 + Carboplatin  (1.5) D1,8,15 + Paclitaxel  (80) D1,8,15 q21d X 4 cycles / Pembrolizumab  (200) D1 + AC D1 q21d x 4 cycles     04/22/2024 -  Chemotherapy   Patient is on Treatment Plan : BREAST Pembrolizumab  (200) q21d x 27 weeks       CHIEF COMPLIANT: Follow-up after surgery, Keytruda  infusion today  HISTORY OF PRESENT ILLNESS:    History of Present Illness Gabrielle Cross is a 58 year old female with stage IIIA estrogen receptor-positive invasive ductal carcinoma of the right breast, status post neoadjuvant chemoimmunotherapy and recent right mastectomy, who presents for oncology follow-up and ongoing maintenance pembrolizumab .  She is recovering from right mastectomy with final pathology showing no residual malignancy and weak estrogen receptor positivity. She reports increasing soreness and localized swelling at the surgical site over the past several days compared with the immediate postoperative period. She is concerned about possible lymphedema but has not noted objective signs and is awaiting surgical guidance on the swelling.  She completed neoadjuvant chemotherapy and immunotherapy with reported complete response before surgery. She started maintenance pembrolizumab  on November 06, 2023, with a planned one-year course. She understands the weak estrogen receptor status of the tumor, and discussion of antiestrogen therapy is deferred until further surgical recovery.  She had prior mild chemotherapy-related anemia that has resolved, with recent labs showing normal hemoglobin and hematocrit and no current anemia symptoms.  No other new symptoms or complications were reported.        ALLERGIES:  is allergic to shellfish allergy.  MEDICATIONS:  Current Outpatient Medications  Medication Sig Dispense Refill  hydrochlorothiazide  (HYDRODIURIL ) 12.5 MG tablet Take 1 tablet (12.5 mg total) by mouth daily. 90 tablet 4   methocarbamol  (ROBAXIN ) 500 MG tablet Take 1 tablet (500 mg total) by mouth every 6 (six) hours as needed for muscle spasms. 30 tablet 2   oxyCODONE  (OXY IR/ROXICODONE ) 5 MG immediate release tablet Take 1 tablet (5 mg total) by mouth every 6 (six) hours as needed for moderate pain (pain score 4-6). 10 tablet 0   potassium chloride  (MICRO-K ) 10 MEQ CR capsule Take one (1) tablet by mouth twice daily for one week, then take one (1) tablet daily. 45 capsule 0   No current facility-administered medications for this visit.    PHYSICAL EXAMINATION: ECOG PERFORMANCE STATUS: 1 - Symptomatic but completely ambulatory  Vitals:   05/13/24 1100  BP: 118/78  Pulse: 76  Resp: 18  Temp: 98.5 F (36.9 C)  SpO2: 100%   Filed Weights   05/13/24 1100  Weight: 164 lb 3.2 oz (74.5 kg)      LABORATORY DATA:  I have reviewed the data as listed    Latest Ref Rng & Units 04/22/2024    2:25 PM 03/31/2024    9:08 AM 03/10/2024    8:55 AM  CMP  Glucose 70 - 99 mg/dL 886  888  894   BUN 6 - 20 mg/dL 17  15  13    Creatinine 0.44 - 1.00 mg/dL 9.27  9.32  9.37   Sodium 135 - 145 mmol/L 138  139  138   Potassium 3.5 - 5.1 mmol/L 3.5  3.5  3.2   Chloride 98 - 111 mmol/L 101  105  103   CO2 22 - 32 mmol/L 26  27  28    Calcium  8.9 - 10.3 mg/dL 9.4  9.3  9.8   Total Protein 6.5 - 8.1 g/dL 6.5  6.8  7.1   Total Bilirubin 0.0 - 1.2 mg/dL 0.2  0.3  0.3   Alkaline Phos 38 - 126 U/L 81  75  76   AST 15 - 41 U/L 22  15  15    ALT 0 - 44 U/L 25  21  16      Lab Results  Component Value Date   WBC 6.5 05/13/2024   HGB 12.0 05/13/2024   HCT 37.0 05/13/2024   MCV 85.6 05/13/2024   PLT 289 05/13/2024   NEUTROABS 3.6 05/13/2024    ASSESSMENT & PLAN:  Malignant neoplasm of upper-outer quadrant of right breast in female, estrogen receptor positive (HCC)   10/07/2023:Screening mammogram detected right  breast mass at 12:00 measuring 5.1 cm, 1 axillary lymph node biopsy was benign biopsy of the mass grade 3 IDC with necrosis ER 30% weak, PR 0%, Ki67 60%, HER2 negative   Treatment plan: Neoadjuvant chemotherapy with Taxol  carbo Keytruda  followed by Adriamycin  Cytoxan  Keytruda  11/06/2023-03/11/2024 05/07/2024: Bilateral mastectomies: Left breast: Benign, right mastectomy: Benign no residual cancer, 0/3 lymph nodes negative (pathologic complete response) Adjuvant radiation therapy Antiestrogen therapy CT CAP and bone scan 11/04/2023: 4.7 cm right breast mass, right axillary lymph node 1.8 cm, 0.2 cm lung nodule benign Breast MRI 10/30/2023: Right breast malignancy 4.7 cm, enlarged right axillary lymph node ------------------------------------------------------------------------------------------------------------------------------------------------ Current treatment: Keytruda  maintenance Because she is ER 30% positive  We will have to see if she is eligible for the optimize PCR clinical trial. Otherwise we will continue with Keytruda  maintenance Present her tumor board to discuss the role of radiation Antiestrogen therapy will be started once she heals well from the  surgery. Assessment & Plan Estrogen receptor positive malignant neoplasm of upper-outer quadrant of right breast She remains in remission post-neoadjuvant chemoimmunotherapy and mastectomy. Complete response is favorable. Maintenance pembrolizumab  ongoing. Adjuvant radiation therapy under review due to borderline tumor size and no nodal involvement. Antiestrogen therapy deferred due to weak ER positivity. Ineligible for clinical trial due to ER status. - Presented case at tumor board to clarify adjuvant radiation therapy need. - Deferred antiestrogen therapy; plan to discuss risks and benefits at a future visit. - Referred to physical therapy for post-mastectomy recovery and evaluation for lymphedema and long-term effects.  Anemia secondary  to cancer therapy Anemia resolved with normalization of hemoglobin and hematocrit. - Reviewed recent laboratory results confirming resolution of anemia.      No orders of the defined types were placed in this encounter.  The patient has a good understanding of the overall plan. she agrees with it. she will call with any problems that may develop before the next visit here.  I personally spent a total of 30 minutes in the care of the patient today including preparing to see the patient, getting/reviewing separately obtained history, performing a medically appropriate exam/evaluation, counseling and educating, placing orders, referring and communicating with other health care professionals, documenting clinical information in the EHR, independently interpreting results, communicating results, and coordinating care.   Viinay K Karle Desrosier, MD 05/13/2024

## 2024-05-13 NOTE — Patient Instructions (Signed)
 [facility] - A DEPT OF [facility]  Discharge Instructions: Thank you for choosing [facility] to provide your oncology and hematology care.   If you have a lab appointment with the Cancer Center, please go directly to the Cancer Center and check in at the registration area.   Wear comfortable clothing and clothing appropriate for easy access to any Portacath or PICC line.   We strive to give you quality time with your provider. You may need to reschedule your appointment if you arrive late (15 or more minutes).  Arriving late affects you and other patients whose appointments are after yours.  Also, if you miss three or more appointments without notifying the office, you may be dismissed from the clinic at the provider's discretion.      For prescription refill requests, have your pharmacy contact our office and allow 72 hours for refills to be completed.    Today you received the following chemotherapy and/or immunotherapy agents: pembrolizumab      To help prevent nausea and vomiting after your treatment, we encourage you to take your nausea medication as directed.  BELOW ARE SYMPTOMS THAT SHOULD BE REPORTED IMMEDIATELY: *FEVER GREATER THAN 100.4 F (38 C) OR HIGHER *CHILLS OR SWEATING *NAUSEA AND VOMITING THAT IS NOT CONTROLLED WITH YOUR NAUSEA MEDICATION *UNUSUAL SHORTNESS OF BREATH *UNUSUAL BRUISING OR BLEEDING *URINARY PROBLEMS (pain or burning when urinating, or frequent urination) *BOWEL PROBLEMS (unusual diarrhea, constipation, pain near the anus) TENDERNESS IN MOUTH AND THROAT WITH OR WITHOUT PRESENCE OF ULCERS (sore throat, sores in mouth, or a toothache) UNUSUAL RASH, SWELLING OR PAIN  UNUSUAL VAGINAL DISCHARGE OR ITCHING   Items with * indicate a potential emergency and should be followed up as soon as possible or go to the Emergency Department if any problems should occur.  Please show the CHEMOTHERAPY ALERT CARD or  IMMUNOTHERAPY ALERT CARD at check-in to the Emergency Department and triage nurse.  Should you have questions after your visit or need to cancel or reschedule your appointment, please contact [facility] - A DEPT OF [facility]  Dept: [phone]  and follow the prompts.  Office hours are 8:00 a.m. to 4:30 p.m. Monday - Friday. Please note that voicemails left after 4:00 p.m. may not be returned until the following business day.  We are closed weekends and major holidays. You have access to a nurse at all times for urgent questions. Please call the main number to the clinic Dept: [phone] and follow the prompts.   For any non-urgent questions, you may also contact your provider using MyChart. We now offer e-Visits for anyone 78 and older to request care online for non-urgent symptoms. For details visit [email or web address].   Also download the MyChart app! Go to the app store, search "MyChart", open the app, select [place], and log in with your MyChart username and password.

## 2024-05-19 ENCOUNTER — Encounter: Payer: Self-pay | Admitting: *Deleted

## 2024-05-19 ENCOUNTER — Telehealth: Payer: Self-pay | Admitting: *Deleted

## 2024-05-19 NOTE — Telephone Encounter (Signed)
 Called and spoke with patient to relay that per Dr. Izell that the benefit of postmastectomy radiation  would be very small if any an that she is comfortable holding radiation.   Patient was happy to hear this news and verbalized understanding. No other questions or concerns at this time.

## 2024-05-29 ENCOUNTER — Other Ambulatory Visit (HOSPITAL_COMMUNITY): Payer: Self-pay

## 2024-05-29 ENCOUNTER — Encounter: Payer: Self-pay | Admitting: Internal Medicine

## 2024-06-02 ENCOUNTER — Inpatient Hospital Stay

## 2024-06-02 ENCOUNTER — Inpatient Hospital Stay: Admitting: Hematology and Oncology

## 2024-06-04 ENCOUNTER — Encounter: Payer: Self-pay | Admitting: Adult Health

## 2024-06-04 ENCOUNTER — Inpatient Hospital Stay: Attending: Hematology and Oncology

## 2024-06-04 ENCOUNTER — Other Ambulatory Visit: Payer: Self-pay

## 2024-06-04 ENCOUNTER — Inpatient Hospital Stay

## 2024-06-04 ENCOUNTER — Inpatient Hospital Stay: Admitting: Adult Health

## 2024-06-04 VITALS — BP 127/76 | HR 73 | Temp 98.4°F | Resp 18 | Wt 169.9 lb

## 2024-06-04 VITALS — BP 138/75 | HR 76 | Temp 98.2°F | Resp 20

## 2024-06-04 DIAGNOSIS — Z170 Estrogen receptor positive status [ER+]: Secondary | ICD-10-CM | POA: Diagnosis not present

## 2024-06-04 DIAGNOSIS — C50411 Malignant neoplasm of upper-outer quadrant of right female breast: Secondary | ICD-10-CM

## 2024-06-04 DIAGNOSIS — Z79620 Long term (current) use of immunosuppressive biologic: Secondary | ICD-10-CM | POA: Insufficient documentation

## 2024-06-04 DIAGNOSIS — Z1732 Human epidermal growth factor receptor 2 negative status: Secondary | ICD-10-CM | POA: Diagnosis not present

## 2024-06-04 DIAGNOSIS — Z5112 Encounter for antineoplastic immunotherapy: Secondary | ICD-10-CM | POA: Insufficient documentation

## 2024-06-04 DIAGNOSIS — Z1722 Progesterone receptor negative status: Secondary | ICD-10-CM | POA: Diagnosis not present

## 2024-06-04 LAB — T4, FREE: Free T4: 0.91 ng/dL (ref 0.80–2.00)

## 2024-06-04 LAB — CBC WITH DIFFERENTIAL (CANCER CENTER ONLY)
Abs Immature Granulocytes: 0.02 K/uL (ref 0.00–0.07)
Basophils Absolute: 0 K/uL (ref 0.0–0.1)
Basophils Relative: 0 %
Eosinophils Absolute: 1.6 K/uL — ABNORMAL HIGH (ref 0.0–0.5)
Eosinophils Relative: 28 %
HCT: 37 % (ref 36.0–46.0)
Hemoglobin: 12 g/dL (ref 12.0–15.0)
Immature Granulocytes: 0 %
Lymphocytes Relative: 27 %
Lymphs Abs: 1.6 K/uL (ref 0.7–4.0)
MCH: 27.4 pg (ref 26.0–34.0)
MCHC: 32.4 g/dL (ref 30.0–36.0)
MCV: 84.5 fL (ref 80.0–100.0)
Monocytes Absolute: 0.5 K/uL (ref 0.1–1.0)
Monocytes Relative: 9 %
Neutro Abs: 2.1 K/uL (ref 1.7–7.7)
Neutrophils Relative %: 36 %
Platelet Count: 250 K/uL (ref 150–400)
RBC: 4.38 MIL/uL (ref 3.87–5.11)
RDW: 14.2 % (ref 11.5–15.5)
WBC Count: 5.8 K/uL (ref 4.0–10.5)
nRBC: 0 % (ref 0.0–0.2)

## 2024-06-04 LAB — CMP (CANCER CENTER ONLY)
ALT: 27 U/L (ref 0–44)
AST: 23 U/L (ref 15–41)
Albumin: 4.2 g/dL (ref 3.5–5.0)
Alkaline Phosphatase: 85 U/L (ref 38–126)
Anion gap: 11 (ref 5–15)
BUN: 14 mg/dL (ref 6–20)
CO2: 23 mmol/L (ref 22–32)
Calcium: 9.4 mg/dL (ref 8.9–10.3)
Chloride: 105 mmol/L (ref 98–111)
Creatinine: 0.67 mg/dL (ref 0.44–1.00)
GFR, Estimated: >60 mL/min
Glucose, Bld: 104 mg/dL — ABNORMAL HIGH (ref 70–99)
Potassium: 3.7 mmol/L (ref 3.5–5.1)
Sodium: 139 mmol/L (ref 135–145)
Total Bilirubin: 0.3 mg/dL (ref 0.0–1.2)
Total Protein: 6.9 g/dL (ref 6.5–8.1)

## 2024-06-04 LAB — TSH: TSH: 0.656 u[IU]/mL (ref 0.350–4.500)

## 2024-06-04 MED ORDER — SODIUM CHLORIDE 0.9 % IV SOLN: INTRAVENOUS | Status: DC

## 2024-06-04 MED ORDER — SODIUM CHLORIDE 0.9 % IV SOLN
200.0000 mg | Freq: Once | INTRAVENOUS | Status: AC
  Administered 2024-06-04: 200 mg via INTRAVENOUS
  Filled 2024-06-04: qty 200

## 2024-06-04 NOTE — Progress Notes (Signed)
 [facility] Cancer Follow up:    Gabrielle Shasta NOVAK, NP [address] [place] [address]   DIAGNOSIS:  Cancer Staging  Malignant neoplasm of upper-outer quadrant of right breast in female, estrogen receptor positive (HCC) Staging form: Breast, AJCC 8th Edition - Clinical stage from 10/22/2023: Stage IIIA (cT3, cN0, cM0, G3, ER+, PR-, HER2-) - Signed by Odean Potts, MD on 10/22/2023 Stage prefix: Initial diagnosis Histologic grading system: 3 grade system Laterality: Right Staged by: Pathologist and managing physician Stage used in treatment planning: Yes National guidelines used in treatment planning: Yes Type of national guideline used in treatment planning: NCCN    SUMMARY OF ONCOLOGIC HISTORY: Oncology History  Malignant neoplasm of upper-outer quadrant of right breast in female, estrogen receptor positive (HCC)  10/07/2023 Initial Diagnosis   Screening mammogram detected right breast mass at 12:00 measuring 5.1 cm, 1 axillary lymph node biopsy was benign biopsy of the mass grade 3 IDC with necrosis ER 30% weak, PR 0%, Ki67 60%, HER2 negative   10/22/2023 Cancer Staging   Staging form: Breast, AJCC 8th Edition - Clinical stage from 10/22/2023: Stage IIIA (cT3, cN0, cM0, G3, ER+, PR-, HER2-) - Signed by Odean Potts, MD on 10/22/2023 Stage prefix: Initial diagnosis Histologic grading system: 3 grade system Laterality: Right Staged by: Pathologist and managing physician Stage used in treatment planning: Yes National guidelines used in treatment planning: Yes Type of national guideline used in treatment planning: NCCN   11/02/2023 Genetic Testing   Negative [facility] CancerNext +RNAinsight Panel.  VUS in BARD1 at p.K438E (c.1312A>G).  Report date is 11/02/2023.   The [facility] CancerNext+RNAinsight Panel includes sequencing, rearrangement analysis, and RNA analysis for the following 40 genes: APC, ATM, BAP1, BARD1, BMPR1A, BRCA1, BRCA2, BRIP1, CDH1, CDKN2A,  CHEK2, FH, FLCN, MET, MLH1, MSH2, MSH6, MUTYH, NF1, NTHL1, PALB2, PMS2, PTEN, RAD51C, RAD51D, RSP20, SMAD4, STK11, TP53, TSC1, TSC2, and VHL (sequencing and deletion/duplication); AXIN2, HOXB13, MBD4, MSH3, POLD1 and POLE (sequencing only); EPCAM and GREM1 (deletion/duplication only).   11/04/2023 Imaging   CT CAP/bone scan: 4.7 cm right breast mass, right axillary lymph node 1.8 cm, 0.2 cm lung nodule benign    11/06/2023 - 03/31/2024 Chemotherapy   Patient is on Treatment Plan : BREAST Pembrolizumab  (200) D1 + Carboplatin  (1.5) D1,8,15 + Paclitaxel  (80) D1,8,15 q21d X 4 cycles / Pembrolizumab  (200) D1 + AC D1 q21d x 4 cycles     03/07/2024 Surgery   Bilateral mastectomies: Left breast: Benign, right mastectomy: Benign no residual cancer, 0/3 lymph nodes negative (pathologic complete response)    04/22/2024 -  Chemotherapy   Patient is on Treatment Plan : BREAST Pembrolizumab  (200) q21d x 27 weeks       CURRENT THERAPY: Pembrolizumab   INTERVAL HISTORY:  Discussed the use of AI scribe software for clinical note transcription with the patient, who gave verbal consent to proceed.  History of Present Illness Gabrielle Cross is a 59 year old female with ER-positive right breast cancer, status post recent surgery, who presents for oncology follow-up and management of chemotherapy-induced peripheral neuropathy.  She is healing well after right breast surgery with tube removal by Dr. Curvin, with occasional sharp pain on one side. She reports no issues or questions with ongoing Keytruda  or current radiation therapy.  She continues to have tingling and numbness in three fingers, which is gradually improving.She has no gastrointestinal complaints and normal bowel function.     Patient Active Problem List   Diagnosis Date Noted   Cancer of  right female breast (HCC) 05/07/2024   Chemotherapy induced neutropenia 02/06/2024   Acute folliculitis 12/09/2023   Chronic hyperglycemia 12/09/2023    Diuretic-induced hypokalemia 12/09/2023   Diabetes mellitus (HCC) 12/09/2023   Port-A-Cath in place 11/13/2023   Genetic testing 11/04/2023   Malignant neoplasm of upper-outer quadrant of right breast in female, estrogen receptor positive (HCC) 10/20/2023   Dyslipidemia, goal LDL below 130 09/17/2022   Acne rosacea 08/23/2022   Inflamed seborrheic keratosis 08/23/2022   Keratosis pilaris 08/23/2022   Vitamin D  deficiency 04/19/2021   Loud snoring 04/18/2021   Primary hypertension 04/18/2021   Encounter for general adult medical examination with abnormal findings 04/18/2021   Visit for screening mammogram 04/18/2021   Erythrocytosis 02/22/2021   Squamous cell cancer of skin of left cheek 03/24/2019   Migraines 01/09/2012    is allergic to shellfish allergy.  MEDICAL HISTORY: Past Medical History:  Diagnosis Date   Arthritis    knees   Blood transfusion without reported diagnosis 2002   Carpal tunnel syndrome, bilateral    Elevated hemoglobin    Hypertension    Incompetent cervix    DELIVERED AT 31 WEEKS   Migraines    OSA (obstructive sleep apnea)    Rx for autopap, does not use   Ruptured ectopic pregnancy 2002   WITH BLOOD TRANSFUSION 2U'S PRBC'S   Wears glasses     SURGICAL HISTORY: Past Surgical History:  Procedure Laterality Date   BREAST BIOPSY Right 10/07/2023   US  RT BREAST BX W LOC DEV 1ST LESION IMG BX SPEC US  GUIDE 10/07/2023 [facility] MAMMOGRAPHY   CERCLAGE PLACEMENT     DILATATION & CURETTAGE/HYSTEROSCOPY WITH MYOSURE N/A 04/19/2022   Procedure: DILATATION & CURETTAGE/HYSTEROSCOPY WITH MYOSURE;  Surgeon: Lavoie, Marie-Lyne, MD;  Location: [facility];  Service: Gynecology;  Laterality: N/A;   IR IMAGING GUIDED PORT INSERTION  11/10/2023   MASTECTOMY W/ SENTINEL NODE BIOPSY Right 05/07/2024   Procedure: MASTECTOMY WITH SENTINEL LYMPH NODE BIOPSY;  Surgeon: Curvin Deward MOULD, MD;  Location: [facility];  Service: General;  Laterality: Right;   GEN w/PEC BLOCK RIGHT MASTECTOMY SENTINEL NODE BIOPSY   PORTACATH PLACEMENT Left 11/05/2023   Procedure: ATTEMPTED INSERTION, TUNNELED CENTRAL VENOUS DEVICE, WITH PORT;  Surgeon: Curvin Deward MOULD, MD;  Location: [facility];  Service: General;  Laterality: Left;  PORT PLACEMENT WITH ULTRASOUND GUIDANCE   RESECTOSCOPIC POLYPECTOMY  2001   SALPINGECTOMY Right    RIGHT, RUPTURED ECTOPIC   SKIN CANCER EXCISION  2020   tangential bx  02/22/2019   TOTAL MASTECTOMY Left 05/07/2024   Procedure: MASTECTOMY, SIMPLE;  Surgeon: Curvin Deward MOULD, MD;  Location: [facility];  Service: General;  Laterality: Left;  LEFT PROPHYLACTIC MASTECTOMY    SOCIAL HISTORY: Social History   Socioeconomic History   Marital status: Married    Spouse name: Not on file   Number of children: Not on file   Years of education: Not on file   Highest education level: Not on file  Occupational History   Not on file  Tobacco Use   Smoking status: Never    Passive exposure: Never   Smokeless tobacco: Never  Vaping Use   Vaping status: Never Used  Substance and Sexual Activity   Alcohol use: Yes    Comment: weekends - social    Drug use: No   Sexual activity: Yes    Partners: Male    Birth control/protection: Post-menopausal    Comment: intercoure age 60,  less than 5 sexual partners, des neg  Other Topics Concern   Not on file  Social History Narrative   Not on file   Social Drivers of Health   Tobacco Use: Low Risk (06/07/2024)   Patient History    Smoking Tobacco Use: Never    Smokeless Tobacco Use: Never    Passive Exposure: Never  Financial Resource Strain: Low Risk (06/04/2024)   Overall Financial Resource Strain (CARDIA)    Difficulty of Paying Living Expenses: Not hard at all  Food Insecurity: No Food Insecurity (06/04/2024)   Epic    Worried About Radiation Protection Practitioner of Food in the Last Year: Never true    Ran Out of Food in the Last Year: Never true  Transportation Needs: No  Transportation Needs (06/04/2024)   Epic    Lack of Transportation (Medical): No    Lack of Transportation (Non-Medical): No  Physical Activity: Inactive (06/04/2024)   Exercise Vital Sign    Days of Exercise per Week: 0 days    Minutes of Exercise per Session: 0 min  Stress: No Stress Concern Present (06/04/2024)   [employer] of Occupational Health - Occupational Stress Questionnaire    Feeling of Stress: Only a little  Social Connections: Moderately Integrated (06/04/2024)   Social Connection and Isolation Panel    Frequency of Communication with Friends and Family: More than three times a week    Frequency of Social Gatherings with Friends and Family: More than three times a week    Attends Religious Services: More than 4 times per year    Active Member of Clubs or Organizations: No    Attends Banker Meetings: Never    Marital Status: Married  Catering Manager Violence: Not At Risk (06/04/2024)   Epic    Fear of Current or Ex-Partner: No    Emotionally Abused: No    Physically Abused: No    Sexually Abused: No  Depression (PHQ2-9): Low Risk (06/04/2024)   Depression (PHQ2-9)    PHQ-2 Score: 0  Alcohol Screen: Low Risk (06/04/2024)   Alcohol Screen    Last Alcohol Screening Score (AUDIT): 1  Housing: Unknown (06/04/2024)   Epic    Unable to Pay for Housing in the Last Year: No    Number of Times Moved in the Last Year: Not on file    Homeless in the Last Year: No  Utilities: Not At Risk (06/04/2024)   Epic    Threatened with loss of utilities: No  Health Literacy: Adequate Health Literacy (06/04/2024)   B1300 Health Literacy    Frequency of need for help with medical instructions: Never    FAMILY HISTORY: Family History  Problem Relation Age of Onset   Colon polyps Mother    Hyperlipidemia Father    Colon polyps Sister    Colon polyps Brother    Colon cancer Neg Hx    Esophageal cancer Neg Hx    Rectal cancer Neg Hx    Stomach cancer Neg Hx    Sleep apnea Neg  Hx     Review of Systems  Constitutional:  Negative for appetite change, chills, fatigue, fever and unexpected weight change.  HENT:   Negative for hearing loss, lump/mass and trouble swallowing.   Eyes:  Negative for eye problems and icterus.  Respiratory:  Negative for chest tightness, cough and shortness of breath.   Cardiovascular:  Negative for chest pain, leg swelling and palpitations.  Gastrointestinal:  Negative for abdominal distention, abdominal pain, constipation, diarrhea,  nausea and vomiting.  Endocrine: Negative for hot flashes.  Genitourinary:  Negative for difficulty urinating.   Musculoskeletal:  Negative for arthralgias.  Skin:  Negative for itching and rash.  Neurological:  Positive for numbness. Negative for dizziness, extremity weakness and headaches.  Hematological:  Negative for adenopathy. Does not bruise/bleed easily.  Psychiatric/Behavioral:  Negative for depression. The patient is not nervous/anxious.       PHYSICAL EXAMINATION    Vitals:   06/04/24 1111  BP: 127/76  Pulse: 73  Resp: 18  Temp: 98.4 F (36.9 C)  SpO2: 99%    Physical Exam Constitutional:      General: She is not in acute distress.    Appearance: Normal appearance. She is not toxic-appearing.  HENT:     Head: Normocephalic and atraumatic.     Mouth/Throat:     Mouth: Mucous membranes are moist.     Pharynx: Oropharynx is clear. No oropharyngeal exudate or posterior oropharyngeal erythema.  Eyes:     General: No scleral icterus. Cardiovascular:     Rate and Rhythm: Normal rate and regular rhythm.     Pulses: Normal pulses.     Heart sounds: Normal heart sounds.  Pulmonary:     Effort: Pulmonary effort is normal.     Breath sounds: Normal breath sounds.  Abdominal:     General: Abdomen is flat. Bowel sounds are normal. There is no distension.     Palpations: Abdomen is soft.     Tenderness: There is no abdominal tenderness.  Musculoskeletal:        General: No swelling.      Cervical back: Neck supple.  Lymphadenopathy:     Cervical: No cervical adenopathy.  Skin:    General: Skin is warm and dry.     Findings: No rash.  Neurological:     General: No focal deficit present.     Mental Status: She is alert.  Psychiatric:        Mood and Affect: Mood normal.        Behavior: Behavior normal.     LABORATORY DATA:  CBC    Component Value Date/Time   WBC 5.8 06/04/2024 1031   WBC 5.6 09/17/2022 0830   RBC 4.38 06/04/2024 1031   HGB 12.0 06/04/2024 1031   HCT 37.0 06/04/2024 1031   PLT 250 06/04/2024 1031   MCV 84.5 06/04/2024 1031   MCH 27.4 06/04/2024 1031   MCHC 32.4 06/04/2024 1031   RDW 14.2 06/04/2024 1031   LYMPHSABS 1.6 06/04/2024 1031   MONOABS 0.5 06/04/2024 1031   EOSABS 1.6 (H) 06/04/2024 1031   BASOSABS 0.0 06/04/2024 1031    CMP     Component Value Date/Time   NA 139 06/04/2024 1031   K 3.7 06/04/2024 1031   CL 105 06/04/2024 1031   CO2 23 06/04/2024 1031   GLUCOSE 104 (H) 06/04/2024 1031   BUN 14 06/04/2024 1031   CREATININE 0.67 06/04/2024 1031   CREATININE 0.89 09/05/2020 0857   CALCIUM  9.4 06/04/2024 1031   PROT 6.9 06/04/2024 1031   ALBUMIN 4.2 06/04/2024 1031   AST 23 06/04/2024 1031   ALT 27 06/04/2024 1031   ALKPHOS 85 06/04/2024 1031   BILITOT 0.3 06/04/2024 1031   GFRNONAA >60 06/04/2024 1031     ASSESSMENT and THERAPY PLAN:   No problem-specific Assessment & Plan notes found for this encounter.   Assessment and Plan Assessment & Plan Estrogen receptor positive right breast cancer, upper-outer quadrant Postoperative recovery is  excellent with minor discomfort. Pembrolizumab  therapy continues. Radiation therapy completed.  - Confirmed healing progress post-surgery and tube removal. - Reviewed pembrolizumab  therapy, patient will proceed with treatment today. - Sent message to head of CT for breast cancer patients regarding physical therapy referral to avoid duplication or delay. - Adjusted  laboratory orders for improved monitoring. - Requested scheduling of February follow-up appointment. - Provided anticipatory guidance for influenza season, including recommendation for at-home influenza testing and instructions for virtual urgent care and oseltamivir (Tamiflu) if indicated.  Chemotherapy-induced peripheral neuropathy Persistent tingling and numbness in three fingers with gradual improvement. - Assessed neuropathy symptoms; advised that gradual improvement is expected. - Scheduled follow-up appointment in three weeks for ongoing evaluation.   RTC in 3 weeks for labs, f/u, and next treatment.    All questions were answered. The patient knows to call the clinic with any problems, questions or concerns. We can certainly see the patient much sooner if necessary.  Total encounter time: 20 minutes*in face-to-face visit time, chart review, lab review, care coordination, order entry, and documentation of the encounter time.    Morna Kendall, NP 06/07/2024 3:12 PM Medical Oncology and Hematology [facility] [address] [place], [place] [address] Tel. [phone]    Fax. [phone]  *Total Encounter Time as defined by the Centers for Medicare and Medicaid Services includes, in addition to the face-to-face time of a patient visit (documented in the note above) non-face-to-face time: obtaining and reviewing outside history, ordering and reviewing medications, tests or procedures, care coordination (communications with other health care professionals or caregivers) and documentation in the medical record.

## 2024-06-04 NOTE — Patient Instructions (Signed)
 [facility] - A DEPT OF [facility]  Discharge Instructions: Thank you for choosing [facility] to provide your oncology and hematology care.   If you have a lab appointment with the Cancer Center, please go directly to the Cancer Center and check in at the registration area.   Wear comfortable clothing and clothing appropriate for easy access to any Portacath or PICC line.   We strive to give you quality time with your provider. You may need to reschedule your appointment if you arrive late (15 or more minutes).  Arriving late affects you and other patients whose appointments are after yours.  Also, if you miss three or more appointments without notifying the office, you may be dismissed from the clinic at the provider's discretion.      For prescription refill requests, have your pharmacy contact our office and allow 72 hours for refills to be completed.    Today you received the following chemotherapy and/or immunotherapy agents: pembrolizumab      To help prevent nausea and vomiting after your treatment, we encourage you to take your nausea medication as directed.  BELOW ARE SYMPTOMS THAT SHOULD BE REPORTED IMMEDIATELY: *FEVER GREATER THAN 100.4 F (38 C) OR HIGHER *CHILLS OR SWEATING *NAUSEA AND VOMITING THAT IS NOT CONTROLLED WITH YOUR NAUSEA MEDICATION *UNUSUAL SHORTNESS OF BREATH *UNUSUAL BRUISING OR BLEEDING *URINARY PROBLEMS (pain or burning when urinating, or frequent urination) *BOWEL PROBLEMS (unusual diarrhea, constipation, pain near the anus) TENDERNESS IN MOUTH AND THROAT WITH OR WITHOUT PRESENCE OF ULCERS (sore throat, sores in mouth, or a toothache) UNUSUAL RASH, SWELLING OR PAIN  UNUSUAL VAGINAL DISCHARGE OR ITCHING   Items with * indicate a potential emergency and should be followed up as soon as possible or go to the Emergency Department if any problems should occur.  Please show the CHEMOTHERAPY ALERT CARD or  IMMUNOTHERAPY ALERT CARD at check-in to the Emergency Department and triage nurse.  Should you have questions after your visit or need to cancel or reschedule your appointment, please contact [facility] - A DEPT OF [facility]  Dept: [phone]  and follow the prompts.  Office hours are 8:00 a.m. to 4:30 p.m. Monday - Friday. Please note that voicemails left after 4:00 p.m. may not be returned until the following business day.  We are closed weekends and major holidays. You have access to a nurse at all times for urgent questions. Please call the main number to the clinic Dept: [phone] and follow the prompts.   For any non-urgent questions, you may also contact your provider using MyChart. We now offer e-Visits for anyone 78 and older to request care online for non-urgent symptoms. For details visit [email or web address].   Also download the MyChart app! Go to the app store, search "MyChart", open the app, select [place], and log in with your MyChart username and password.

## 2024-06-07 ENCOUNTER — Other Ambulatory Visit: Payer: Self-pay

## 2024-06-07 ENCOUNTER — Other Ambulatory Visit (HOSPITAL_COMMUNITY): Payer: Self-pay

## 2024-06-07 ENCOUNTER — Encounter: Payer: Self-pay | Admitting: Adult Health

## 2024-06-07 ENCOUNTER — Other Ambulatory Visit: Payer: Self-pay | Admitting: Internal Medicine

## 2024-06-07 ENCOUNTER — Encounter: Payer: Self-pay | Admitting: Hematology and Oncology

## 2024-06-07 DIAGNOSIS — C50411 Malignant neoplasm of upper-outer quadrant of right female breast: Secondary | ICD-10-CM

## 2024-06-07 DIAGNOSIS — E785 Hyperlipidemia, unspecified: Secondary | ICD-10-CM

## 2024-06-07 MED ORDER — ROSUVASTATIN CALCIUM 10 MG PO TABS
10.0000 mg | ORAL_TABLET | Freq: Every day | ORAL | 0 refills | Status: AC
  Filled 2024-06-07: qty 90, 90d supply, fill #0

## 2024-06-10 ENCOUNTER — Encounter: Payer: Self-pay | Admitting: Physical Therapy

## 2024-06-10 ENCOUNTER — Ambulatory Visit: Attending: Adult Health | Admitting: Physical Therapy

## 2024-06-10 DIAGNOSIS — M25611 Stiffness of right shoulder, not elsewhere classified: Secondary | ICD-10-CM

## 2024-06-10 DIAGNOSIS — R600 Localized edema: Secondary | ICD-10-CM

## 2024-06-10 DIAGNOSIS — C50411 Malignant neoplasm of upper-outer quadrant of right female breast: Secondary | ICD-10-CM | POA: Diagnosis present

## 2024-06-10 DIAGNOSIS — Z170 Estrogen receptor positive status [ER+]: Secondary | ICD-10-CM | POA: Insufficient documentation

## 2024-06-10 DIAGNOSIS — R293 Abnormal posture: Secondary | ICD-10-CM

## 2024-06-10 DIAGNOSIS — Z483 Aftercare following surgery for neoplasm: Secondary | ICD-10-CM

## 2024-06-10 DIAGNOSIS — M25612 Stiffness of left shoulder, not elsewhere classified: Secondary | ICD-10-CM

## 2024-06-10 NOTE — Therapy (Deleted)
 " OUTPATIENT PHYSICAL THERAPY BREAST CANCER POST OP FOLLOW UP   Patient Name: Gabrielle Cross MRN: [id number] DOB:1965-07-06, 59 y.o., female Today's Date: 06/10/2024  END OF SESSION:   Past Medical History:  Diagnosis Date   Arthritis    knees   Blood transfusion without reported diagnosis 2002   Carpal tunnel syndrome, bilateral    Elevated hemoglobin    Hypertension    Incompetent cervix    DELIVERED AT 31 WEEKS   Migraines    OSA (obstructive sleep apnea)    Rx for autopap, does not use   Ruptured ectopic pregnancy 2002   WITH BLOOD TRANSFUSION 2U'S PRBC'S   Wears glasses    Past Surgical History:  Procedure Laterality Date   BREAST BIOPSY Right 10/07/2023   US  RT BREAST BX W LOC DEV 1ST LESION IMG BX SPEC US  GUIDE 10/07/2023 [facility] MAMMOGRAPHY   CERCLAGE PLACEMENT     DILATATION & CURETTAGE/HYSTEROSCOPY WITH MYOSURE N/A 04/19/2022   Procedure: DILATATION & CURETTAGE/HYSTEROSCOPY WITH MYOSURE;  Surgeon: Lavoie, Marie-Lyne, MD;  Location: [facility];  Service: Gynecology;  Laterality: N/A;   IR IMAGING GUIDED PORT INSERTION  11/10/2023   MASTECTOMY W/ SENTINEL NODE BIOPSY Right 05/07/2024   Procedure: MASTECTOMY WITH SENTINEL LYMPH NODE BIOPSY;  Surgeon: Curvin Deward MOULD, MD;  Location: [facility];  Service: General;  Laterality: Right;  GEN w/PEC BLOCK RIGHT MASTECTOMY SENTINEL NODE BIOPSY   PORTACATH PLACEMENT Left 11/05/2023   Procedure: ATTEMPTED INSERTION, TUNNELED CENTRAL VENOUS DEVICE, WITH PORT;  Surgeon: Curvin Deward MOULD, MD;  Location: [facility];  Service: General;  Laterality: Left;  PORT PLACEMENT WITH ULTRASOUND GUIDANCE   RESECTOSCOPIC POLYPECTOMY  2001   SALPINGECTOMY Right    RIGHT, RUPTURED ECTOPIC   SKIN CANCER EXCISION  2020   tangential bx  02/22/2019   TOTAL MASTECTOMY Left 05/07/2024   Procedure: MASTECTOMY, SIMPLE;  Surgeon: Curvin Deward MOULD, MD;  Location: [facility];  Service: General;  Laterality:  Left;  LEFT PROPHYLACTIC MASTECTOMY   Patient Active Problem List   Diagnosis Date Noted   Cancer of right female breast (HCC) 05/07/2024   Chemotherapy induced neutropenia 02/06/2024   Acute folliculitis 12/09/2023   Chronic hyperglycemia 12/09/2023   Diuretic-induced hypokalemia 12/09/2023   Diabetes mellitus (HCC) 12/09/2023   Port-A-Cath in place 11/13/2023   Genetic testing 11/04/2023   Malignant neoplasm of upper-outer quadrant of right breast in female, estrogen receptor positive (HCC) 10/20/2023   Dyslipidemia, goal LDL below 130 09/17/2022   Acne rosacea 08/23/2022   Inflamed seborrheic keratosis 08/23/2022   Keratosis pilaris 08/23/2022   Vitamin D  deficiency 04/19/2021   Loud snoring 04/18/2021   Primary hypertension 04/18/2021   Encounter for general adult medical examination with abnormal findings 04/18/2021   Visit for screening mammogram 04/18/2021   Erythrocytosis 02/22/2021   Squamous cell cancer of skin of left cheek 03/24/2019   Migraines 01/09/2012    REFERRING PROVIDER: Dr. Deward Curvin  REFERRING DIAG: Right breast cancer  THERAPY DIAG:  Malignant neoplasm of upper-outer quadrant of right breast in female, estrogen receptor positive (HCC)  Abnormal posture  Aftercare following surgery for neoplasm  Rationale for Evaluation and Treatment: Rehabilitation  ONSET DATE: 05/07/2024  SUBJECTIVE:  SUBJECTIVE STATEMENT: Patient reports she completed neoadjuvant chemotherapy on 03/31/2024 and underwent a bilateral mastectomy with a right sentinel node biopsy (3 negative nodes) on 05/07/2024.   PERTINENT HISTORY:  Patient was diagnosed on 09/12/2023 with right grade 3 invasive ductal carcinoma breast cancer. She underwent a bilateral mastectomy with a right sentinel node biopsy (3  negative nodes) on 05/07/2024. It is weakly ER positive, PR and HER2 negative with a Ki67 of 60%.   PATIENT GOALS:  Reassess how my recovery is going related to arm function, pain, and swelling.  PAIN:  Are you having pain? {OPRCPAIN:27236}  PRECAUTIONS: Recent Surgery, right UE Lymphedema risk  RED FLAGS: {PT Red Flags:29287}   ACTIVITY LEVEL / LEISURE: ***   OBJECTIVE:   PATIENT SURVEYS:  QUICK DASH: ***  OBSERVATIONS: ***  POSTURE:  ***  LYMPHEDEMA ASSESSMENT:   UPPER EXTREMITY AROM/PROM:   A/PROM RIGHT   eval   RIGHT 06/10/2024  Shoulder extension 45   Shoulder flexion 158   Shoulder abduction 160   Shoulder internal rotation 65   Shoulder external rotation 80                           (Blank rows = not tested)   A/PROM LEFT   eval  Shoulder extension 42  Shoulder flexion 141  Shoulder abduction 163  Shoulder internal rotation 58  Shoulder external rotation 81                          (Blank rows = not tested)   CERVICAL AROM: All within normal limits   UPPER EXTREMITY STRENGTH: WFL   LYMPHEDEMA ASSESSMENTS (in cm):    LANDMARK RIGHT   eval RIGHT 06/10/2024  10 cm proximal to olecranon process 32.8   Olecranon process 26.9   10 cm proximal to ulnar styloid process 26   Just proximal to ulnar styloid process 17.6   Across hand at thumb web space 19.9   At base of 2nd digit 6.2   (Blank rows = not tested)   LANDMARK LEFT   eval LEFT 06/10/2024  10 cm proximal to olecranon process 32.7   Olecranon process 26.6   10 cm proximal to ulnar styloid process 24.5   Just proximal to ulnar styloid process 18.5   Across hand at thumb web space 18.2   At base of 2nd digit 5.9   (Blank rows = not tested)  Surgery type/Date: 05/07/2024 bil mastectomy with right sentinel node biopsy Number of lymph nodes removed: 3 Current/past treatment (chemo, radiation, hormone therapy): chemo completed 03/31/2024; currently on Keytruda .  Other symptoms:   Heaviness/tightness {yes/no:20286} Pain {yes/no:20286} Pitting edema {yes/no:20286} Infections {yes/no:20286} Decreased scar mobility {yes/no:20286} Stemmer sign {yes/no:20286}  PATIENT EDUCATION:  Education details: *** Person educated: {Person educated:25204} Education method: {Education Method:25205} Education comprehension: {Education Comprehension:25206}  HOME EXERCISE PROGRAM: Reviewed previously given post op HEP. ***  ASSESSMENT:  CLINICAL IMPRESSION: ***  Pt will benefit from skilled therapeutic intervention to improve on the following deficits: Decreased knowledge of precautions, impaired UE functional use, pain, decreased ROM, postural dysfunction.   PT treatment/interventions: ADL/Self care home management, {rehab planned interventions:25118::97110-Therapeutic exercises,97530- Therapeutic [phone]- Neuromuscular re-education,97535- Self Rjmz,02859- Manual therapy,Patient/Family education}   GOALS: Goals reviewed with patient? {yes/no:20286}  GOALS MET AT EVAL:  GOALS Name Target Date Goal status  1 Pt will be able to verbalize understanding of pertinent lymphedema risk reduction practices relevant to her dx  specifically related to skin care.  Baseline:  No knowledge Eval Achieved at eval  2 Pt will be able to return demo and/or verbalize understanding of the post op HEP related to regaining shoulder ROM. Baseline:  No knowledge Eval Achieved at eval  3 Pt will be able to verbalize understanding of the importance of viewing the post op After Breast CA Class video for further lymphedema risk reduction education and therapeutic exercise.  Baseline:  No knowledge Eval Achieved at eval   LONG TERM GOALS:  (STG=LTG)  GOALS Name Target Date  Goal status  1 Pt will demonstrate she has regained full shoulder ROM and function post operatively compared to baselines.  Baseline: 04/23/2024 INITIAL  2  *** INITIAL  3  *** INITIAL  4  *** INITIAL      PLAN:  PT FREQUENCY/DURATION: ***  PLAN FOR NEXT SESSION: ***   [facility]  [address]  [place] [address]  [phone]  After Breast Cancer Class Video It is recommended you view the ABC class video to be educated on lymphedema risk reduction. This video lasts for about 30 minutes. It can be viewed on our website here: [email or web address]  Scar massage You can begin gentle scar massage to you incision sites. Gently place one hand on the incision and move the skin (without sliding on the skin) in various directions. Do this for a few minutes and then you can gently massage either coconut oil or vitamin E cream into the scars.  Compression garment You should continue wearing your compression bra until you feel like you no longer have swelling.  Home exercise Program Continue doing the exercises you were given until you feel like you can do them without feeling any tightness at the end.   Walking Program Studies show that 30 minutes of walking per day (fast enough to elevate your heart rate) can significantly reduce the risk of a cancer recurrence. If you can't walk due to other medical reasons, we encourage you to find another activity you could do (like a stationary bike or water exercise).  Posture After breast cancer surgery, people frequently sit with rounded shoulders posture because it puts their incisions on slack and feels better. If you sit like this and scar tissue forms in that position, you can become very tight and have pain sitting or standing with good posture. Try to be aware of your posture and sit and stand up tall to heal properly.  Follow up PT: It is recommended you return every 3 months for the first 3 years following surgery to be assessed on the SOZO machine for an L-Dex score. This helps prevent clinically significant lymphedema in 95% of patients.  These follow up screens are 10 minute appointments that you are not billed for.  Londan Coplen,MARTI COOPER, PT 06/10/2024, 8:32 AM  "

## 2024-06-10 NOTE — Therapy (Signed)
 " OUTPATIENT PHYSICAL THERAPY BREAST CANCER POST OP FOLLOW UP   Patient Name: Gabrielle Cross MRN: [id number] DOB:[birth year], 59 y.o., female Today's Date: 06/10/2024  END OF SESSION:  PT End of Session - 06/10/24 0943     Visit Number 2    Number of Visits 10    Date for Recertification  07/08/24    PT Start Time 0903    PT Stop Time 0941    PT Time Calculation (min) 38 min    Activity Tolerance Patient tolerated treatment well    Behavior During Therapy [facility] for tasks assessed/performed          Past Medical History:  Diagnosis Date   Arthritis    knees   Blood transfusion without reported diagnosis 2002   Carpal tunnel syndrome, bilateral    Elevated hemoglobin    Hypertension    Incompetent cervix    DELIVERED AT 31 WEEKS   Migraines    OSA (obstructive sleep apnea)    Rx for autopap, does not use   Ruptured ectopic pregnancy 2002   WITH BLOOD TRANSFUSION 2U'S PRBC'S   Wears glasses    Past Surgical History:  Procedure Laterality Date   BREAST BIOPSY Right 10/07/2023   US  RT BREAST BX W LOC DEV 1ST LESION IMG BX SPEC US  GUIDE 10/07/2023 [facility] MAMMOGRAPHY   CERCLAGE PLACEMENT     DILATATION & CURETTAGE/HYSTEROSCOPY WITH MYOSURE N/A 04/19/2022   Procedure: DILATATION & CURETTAGE/HYSTEROSCOPY WITH MYOSURE;  Surgeon: Lavoie, Marie-Lyne, MD;  Location: [facility];  Service: Gynecology;  Laterality: N/A;   IR IMAGING GUIDED PORT INSERTION  11/10/2023   MASTECTOMY W/ SENTINEL NODE BIOPSY Right 05/07/2024   Procedure: MASTECTOMY WITH SENTINEL LYMPH NODE BIOPSY;  Surgeon: Curvin Deward MOULD, MD;  Location: [facility];  Service: General;  Laterality: Right;  GEN w/PEC BLOCK RIGHT MASTECTOMY SENTINEL NODE BIOPSY   PORTACATH PLACEMENT Left 11/05/2023   Procedure: ATTEMPTED INSERTION, TUNNELED CENTRAL VENOUS DEVICE, WITH PORT;  Surgeon: Curvin Deward MOULD, MD;  Location: [facility];  Service: General;  Laterality: Left;  PORT PLACEMENT WITH  ULTRASOUND GUIDANCE   RESECTOSCOPIC POLYPECTOMY  2001   SALPINGECTOMY Right    RIGHT, RUPTURED ECTOPIC   SKIN CANCER EXCISION  2020   tangential bx  02/22/2019   TOTAL MASTECTOMY Left 05/07/2024   Procedure: MASTECTOMY, SIMPLE;  Surgeon: Curvin Deward MOULD, MD;  Location: [facility];  Service: General;  Laterality: Left;  LEFT PROPHYLACTIC MASTECTOMY   Patient Active Problem List   Diagnosis Date Noted   Cancer of right female breast (HCC) 05/07/2024   Chemotherapy induced neutropenia 02/06/2024   Acute folliculitis 12/09/2023   Chronic hyperglycemia 12/09/2023   Diuretic-induced hypokalemia 12/09/2023   Diabetes mellitus (HCC) 12/09/2023   Port-A-Cath in place 11/13/2023   Genetic testing 11/04/2023   Malignant neoplasm of upper-outer quadrant of right breast in female, estrogen receptor positive (HCC) 10/20/2023   Dyslipidemia, goal LDL below 130 09/17/2022   Acne rosacea 08/23/2022   Inflamed seborrheic keratosis 08/23/2022   Keratosis pilaris 08/23/2022   Vitamin D  deficiency 04/19/2021   Loud snoring 04/18/2021   Primary hypertension 04/18/2021   Encounter for general adult medical examination with abnormal findings 04/18/2021   Visit for screening mammogram 04/18/2021   Erythrocytosis 02/22/2021   Squamous cell cancer of skin of left cheek 03/24/2019   Migraines 01/09/2012    REFERRING PROVIDER: Dr. Deward Curvin  REFERRING DIAG: Right breast cancer  THERAPY DIAG:  Malignant neoplasm  of upper-outer quadrant of right breast in female, estrogen receptor positive (HCC)  Stiffness of right shoulder, not elsewhere classified  Stiffness of left shoulder, not elsewhere classified  Aftercare following surgery for neoplasm  Localized edema  Abnormal posture  Rationale for Evaluation and Treatment: Rehabilitation  ONSET DATE: 05/07/2024  SUBJECTIVE:                                                                                                                                                                                            SUBJECTIVE STATEMENT: Patient reports she completed neoadjuvant chemotherapy on 03/31/2024 and underwent a bilateral mastectomy with a right sentinel node biopsy (3 negative nodes) on 05/07/2024. 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   PERTINENT HISTORY:  Patient was diagnosed on 09/12/2023 with right grade 3 invasive ductal carcinoma breast cancer. She underwent a bilateral mastectomy with a right sentinel node biopsy (3 negative nodes) on 05/07/2024. It is weakly ER positive, PR and HER2 negative with a Ki67 of 60%.   PATIENT GOALS:  Reassess how my recovery is going related to arm function, pain, and swelling.  PAIN:  Are you having pain? No  PRECAUTIONS: Recent Surgery, right UE Lymphedema risk  RED FLAGS: None   ACTIVITY LEVEL / LEISURE: pt is not currently exercising    OBJECTIVE:   PATIENT SURVEYS:  QUICK DASH:  Quick Dash - 06/10/24 0001     Open a tight or new jar Mild difficulty    Do heavy household chores (wash walls, wash floors) Moderate difficulty    Carry a shopping bag or briefcase No difficulty    Wash your back No difficulty    Use a knife to cut food No difficulty    Recreational activities in which you take some force or impact through your arm, shoulder, or hand (golf, hammering, tennis) Moderate difficulty    During the past week, to what extent has your arm, shoulder or hand problem interfered with your normal social activities with family, friends, neighbors, or groups? Quite a bit    During the past week, to what extent has your arm, shoulder or hand problem limited your work or other regular daily activities Quite a bit    Arm, shoulder, or hand pain. Mild    Tingling (pins and needles) in your arm, shoulder, or hand Moderate    Difficulty Sleeping No difficulty    DASH Score 31.82 %            OBSERVATIONS: Bilaeral healing  mastectomy scars with glue still intact, some mild post op swelling inferior to R mastectomy   POSTURE:  Forward head, rounded shoulders  LYMPHEDEMA ASSESSMENT:   UPPER EXTREMITY AROM/PROM:   A/PROM RIGHT   eval   RIGHT 06/10/2024  Shoulder extension 45 50  Shoulder flexion 158 142  Shoulder abduction 160 130  Shoulder internal rotation 65 67  Shoulder external rotation 80 84                          (Blank rows = not tested)   A/PROM LEFT   eval LEFT 06/10/24  Shoulder extension 42 48  Shoulder flexion 141 155 with pulling  Shoulder abduction 163 156 with pulling  Shoulder internal rotation 58 55  Shoulder external rotation 81 89                          (Blank rows = not tested)   CERVICAL AROM: All within normal limits   UPPER EXTREMITY STRENGTH: WFL   LYMPHEDEMA ASSESSMENTS (in cm):    LANDMARK RIGHT   eval RIGHT 06/10/2024  10 cm proximal to olecranon process 32.8 29.3  Olecranon process 26.9 26  10  cm proximal to ulnar styloid process 26 23.5  Just proximal to ulnar styloid process 17.6 16  Across hand at thumb web space 19.9 19.2  At base of 2nd digit 6.2 6  (Blank rows = not tested)   LANDMARK LEFT   eval LEFT 06/10/2024  10 cm proximal to olecranon process 32.7 29.9  Olecranon process 26.6 26.4  10 cm proximal to ulnar styloid process 24.5 23.6  Just proximal to ulnar styloid process 18.5 16  Across hand at thumb web space 18.2 18  At base of 2nd digit 5.9 6  (Blank rows = not tested)  Surgery type/Date: 05/07/2024 bil mastectomy with right sentinel node biopsy Number of lymph nodes removed: 3 Current/past treatment (chemo, radiation, hormone therapy): chemo completed 03/31/2024; currently on Keytruda .  Other symptoms:  Heaviness/tightness Yes - tightness at R pec Pain No Pitting edema No Infections No Decreased scar mobility No Stemmer sign No  TREATMENT PERFORMED: 06/10/24: Instructed pt in post op  exercises and educated pt about lymphedema etc - see education section, cut 1/2 inch grey foam and placed in thick stockinette for pt to wear in her compression bra for additional compression to help with post op edema   PATIENT EDUCATION:  Education details: walking program, posture, proper ergonomic set up of desk. Post op exercises, scar mobilization, lymphedema and risk reduction practices, wearing 1/2 foam in bra as comfortable to help with post op swelling Person educated: Patient Education method: Explanation, Demonstration, and Handouts Education comprehension: verbalized understanding  HOME EXERCISE PROGRAM: Reviewed previously given post op HEP. Walking program  ASSESSMENT:  CLINICAL IMPRESSION: Pt reports to PT after undergoing bilateral mastectomies and R SLNB on 05/07/24. She has limited bilateral shoulder ROM especially in direction of abduction since surgery. She has some post surgical swelling inferior to her R mastectomy scar. Pt would benefit from skilled PT services to improve bilateral shoulder ROM, decrease post op edema and instruct pt in self MLD and progress pt towards independence with a home exercise program for posture and continued stretching and strengthening.   Pt will benefit from skilled therapeutic intervention to improve on the following deficits: Decreased knowledge of precautions, impaired UE functional use, pain, decreased ROM, postural dysfunction.   PT treatment/interventions:  ADL/Self care home management, [phone]- PT Re-evaluation, 97110-Therapeutic exercises, 97530- Therapeutic activity, [id number]- Neuromuscular re-education, 97535- Self Care, [id number]- Manual therapy, [phone]- Orthotic Initial, [phone]- Orthotic/Prosthetic subsequent, and Patient/Family education   GOALS: Goals reviewed with patient? Yes  GOALS MET AT EVAL:  GOALS Name Target Date Goal status  1 Pt will be able to verbalize understanding of pertinent lymphedema risk reduction practices  relevant to her dx specifically related to skin care.  Baseline:  No knowledge Eval Achieved at eval  2 Pt will be able to return demo and/or verbalize understanding of the post op HEP related to regaining shoulder ROM. Baseline:  No knowledge Eval Achieved at eval  3 Pt will be able to verbalize understanding of the importance of viewing the post op After Breast CA Class video for further lymphedema risk reduction education and therapeutic exercise.  Baseline:  No knowledge Eval Achieved at eval   LONG TERM GOALS:  (STG=LTG)  GOALS Name Target Date  Goal status  1 Pt will demonstrate she has regained full shoulder ROM and function post operatively compared to baselines.  Baseline: 07/08/24 INITIAL  2 Pt will be independent in self MLD for management of post op edema.  07/08/24 INITIAL  3 Pt will be independent in a home exercise program for improving posture and for continued stretching and strengthening.  07/08/24 INITIAL     PLAN:  PT FREQUENCY/DURATION: 2x/wk for 4 wks  PLAN FOR NEXT SESSION: pulleys, ball, PROM to bilateral shoulders, supine scap series, MLD R chest inferior to scar and instruct pt, how was foam?   [facility]  [address], [address]  [place] [address]  [phone]  After Breast Cancer Class Video It is recommended you view the ABC class video to be educated on lymphedema risk reduction. This video lasts for about 30 minutes. It can be viewed on our website here: [email or web address]  Scar massage You can begin gentle scar massage to you incision sites. Gently place one hand on the incision and move the skin (without sliding on the skin) in various directions. Do this for a few minutes and then you can gently massage either coconut oil or vitamin E cream into the scars.  Compression garment You should continue wearing your compression bra until you feel like you no longer  have swelling.  Home exercise Program Continue doing the exercises you were given until you feel like you can do them without feeling any tightness at the end.   Walking Program Studies show that 30 minutes of walking per day (fast enough to elevate your heart rate) can significantly reduce the risk of a cancer recurrence. If you can't walk due to other medical reasons, we encourage you to find another activity you could do (like a stationary bike or water exercise).  Posture After breast cancer surgery, people frequently sit with rounded shoulders posture because it puts their incisions on slack and feels better. If you sit like this and scar tissue forms in that position, you can become very tight and have pain sitting or standing with good posture. Try to be aware of your posture and sit and stand up tall to heal properly.  Follow up PT: It is recommended you return every 3 months for the first 3 years following surgery to be assessed on the SOZO machine for an L-Dex score. This helps prevent clinically significant lymphedema in 95% of patients. These follow up screens are 10 minute appointments that you are not billed for.  [facility] [place], PT 06/10/2024, 9:54 AM  "

## 2024-06-15 ENCOUNTER — Ambulatory Visit

## 2024-06-15 DIAGNOSIS — R600 Localized edema: Secondary | ICD-10-CM

## 2024-06-15 DIAGNOSIS — C50411 Malignant neoplasm of upper-outer quadrant of right female breast: Secondary | ICD-10-CM

## 2024-06-15 DIAGNOSIS — Z483 Aftercare following surgery for neoplasm: Secondary | ICD-10-CM

## 2024-06-15 DIAGNOSIS — M25611 Stiffness of right shoulder, not elsewhere classified: Secondary | ICD-10-CM

## 2024-06-15 DIAGNOSIS — R293 Abnormal posture: Secondary | ICD-10-CM

## 2024-06-15 DIAGNOSIS — M25612 Stiffness of left shoulder, not elsewhere classified: Secondary | ICD-10-CM

## 2024-06-15 NOTE — Therapy (Signed)
 " OUTPATIENT PHYSICAL THERAPY BREAST CANCER TREATMENT   Patient Name: Jacquelin Krajewski MRN: [id number] DOB:July 07, 1965, 59 y.o., female Today's Date: 06/15/2024  END OF SESSION:  PT End of Session - 06/15/24 0807     Visit Number 3    Number of Visits 10    Date for Recertification  07/08/24    PT Start Time 0800    PT Stop Time 0856    PT Time Calculation (min) 56 min    Activity Tolerance Patient tolerated treatment well    Behavior During Therapy [facility] for tasks assessed/performed          Past Medical History:  Diagnosis Date   Arthritis    knees   Blood transfusion without reported diagnosis 2002   Carpal tunnel syndrome, bilateral    Elevated hemoglobin    Hypertension    Incompetent cervix    DELIVERED AT 31 WEEKS   Migraines    OSA (obstructive sleep apnea)    Rx for autopap, does not use   Ruptured ectopic pregnancy 2002   WITH BLOOD TRANSFUSION 2U'S PRBC'S   Wears glasses    Past Surgical History:  Procedure Laterality Date   BREAST BIOPSY Right 10/07/2023   US  RT BREAST BX W LOC DEV 1ST LESION IMG BX SPEC US  GUIDE 10/07/2023 [facility] MAMMOGRAPHY   CERCLAGE PLACEMENT     DILATATION & CURETTAGE/HYSTEROSCOPY WITH MYOSURE N/A 04/19/2022   Procedure: DILATATION & CURETTAGE/HYSTEROSCOPY WITH MYOSURE;  Surgeon: Lavoie, Marie-Lyne, MD;  Location: [facility];  Service: Gynecology;  Laterality: N/A;   IR IMAGING GUIDED PORT INSERTION  11/10/2023   MASTECTOMY W/ SENTINEL NODE BIOPSY Right 05/07/2024   Procedure: MASTECTOMY WITH SENTINEL LYMPH NODE BIOPSY;  Surgeon: Curvin Deward MOULD, MD;  Location: [facility];  Service: General;  Laterality: Right;  GEN w/PEC BLOCK RIGHT MASTECTOMY SENTINEL NODE BIOPSY   PORTACATH PLACEMENT Left 11/05/2023   Procedure: ATTEMPTED INSERTION, TUNNELED CENTRAL VENOUS DEVICE, WITH PORT;  Surgeon: Curvin Deward MOULD, MD;  Location: [facility];  Service: General;  Laterality: Left;  PORT PLACEMENT WITH ULTRASOUND  GUIDANCE   RESECTOSCOPIC POLYPECTOMY  2001   SALPINGECTOMY Right    RIGHT, RUPTURED ECTOPIC   SKIN CANCER EXCISION  2020   tangential bx  02/22/2019   TOTAL MASTECTOMY Left 05/07/2024   Procedure: MASTECTOMY, SIMPLE;  Surgeon: Curvin Deward MOULD, MD;  Location: [facility];  Service: General;  Laterality: Left;  LEFT PROPHYLACTIC MASTECTOMY   Patient Active Problem List   Diagnosis Date Noted   Cancer of right female breast (HCC) 05/07/2024   Chemotherapy induced neutropenia 02/06/2024   Acute folliculitis 12/09/2023   Chronic hyperglycemia 12/09/2023   Diuretic-induced hypokalemia 12/09/2023   Diabetes mellitus (HCC) 12/09/2023   Port-A-Cath in place 11/13/2023   Genetic testing 11/04/2023   Malignant neoplasm of upper-outer quadrant of right breast in female, estrogen receptor positive (HCC) 10/20/2023   Dyslipidemia, goal LDL below 130 09/17/2022   Acne rosacea 08/23/2022   Inflamed seborrheic keratosis 08/23/2022   Keratosis pilaris 08/23/2022   Vitamin D  deficiency 04/19/2021   Loud snoring 04/18/2021   Primary hypertension 04/18/2021   Encounter for general adult medical examination with abnormal findings 04/18/2021   Visit for screening mammogram 04/18/2021   Erythrocytosis 02/22/2021   Squamous cell cancer of skin of left cheek 03/24/2019   Migraines 01/09/2012    REFERRING PROVIDER: Dr. Deward Curvin  REFERRING DIAG: Right breast cancer  THERAPY DIAG:  Stiffness of right shoulder, not  elsewhere classified  Stiffness of left shoulder, not elsewhere classified  Aftercare following surgery for neoplasm  Localized edema  Abnormal posture  Malignant neoplasm of upper-outer quadrant of right breast in female, estrogen receptor positive (HCC)  Rationale for Evaluation and Treatment: Rehabilitation  ONSET DATE: 05/07/2024  SUBJECTIVE:                                                                                                                                                                                            SUBJECTIVE STATEMENT: I've been doing the exercises Blaire gave me last time. I just mostly have discomfort at my Rt axilla. The foam she gave me last time has been seeming to help as well. The swelling on my Rt chest is a bit better.   PERTINENT HISTORY:  Patient was diagnosed on 09/12/2023 with right grade 3 invasive ductal carcinoma breast cancer. She underwent a bilateral mastectomy with a right sentinel node biopsy (3 negative nodes) on 05/07/2024. It is weakly ER positive, PR and HER2 negative with a Ki67 of 60%.   PATIENT GOALS:  Reassess how my recovery is going related to arm function, pain, and swelling.  PAIN:  Are you having pain? No  PRECAUTIONS: Recent Surgery, right UE Lymphedema risk  RED FLAGS: None   ACTIVITY LEVEL / LEISURE: pt is not currently exercising    OBJECTIVE:   PATIENT SURVEYS:  QUICK DASH:     OBSERVATIONS: Bilaeral healing mastectomy scars with glue still intact, some mild post op swelling inferior to R mastectomy   POSTURE:  Forward head, rounded shoulders  LYMPHEDEMA ASSESSMENT:   UPPER EXTREMITY AROM/PROM:   A/PROM RIGHT   eval   RIGHT 06/10/2024  Shoulder extension 45 50  Shoulder flexion 158 142  Shoulder abduction 160 130  Shoulder internal rotation 65 67  Shoulder external rotation 80 84                          (Blank rows = not tested)   A/PROM LEFT   eval LEFT 06/10/24  Shoulder extension 42 48  Shoulder flexion 141 155 with pulling  Shoulder abduction 163 156 with pulling  Shoulder internal rotation 58 55  Shoulder external rotation 81 89                          (Blank rows = not tested)   CERVICAL AROM: All within normal limits   UPPER EXTREMITY STRENGTH: WFL   LYMPHEDEMA ASSESSMENTS (in cm):    LANDMARK RIGHT   eval RIGHT 06/10/2024  10 cm  proximal to olecranon process 32.8 29.3  Olecranon process 26.9 26  10  cm proximal to ulnar styloid process 26  23.5  Just proximal to ulnar styloid process 17.6 16  Across hand at thumb web space 19.9 19.2  At base of 2nd digit 6.2 6  (Blank rows = not tested)   LANDMARK LEFT   eval LEFT 06/10/2024  10 cm proximal to olecranon process 32.7 29.9  Olecranon process 26.6 26.4  10 cm proximal to ulnar styloid process 24.5 23.6  Just proximal to ulnar styloid process 18.5 16  Across hand at thumb web space 18.2 18  At base of 2nd digit 5.9 6  (Blank rows = not tested)  Surgery type/Date: 05/07/2024 bil mastectomy with right sentinel node biopsy Number of lymph nodes removed: 3 Current/past treatment (chemo, radiation, hormone therapy): chemo completed 03/31/2024; currently on Keytruda .  Other symptoms:  Heaviness/tightness Yes - tightness at R pec Pain No Pitting edema No Infections No Decreased scar mobility No Stemmer sign No  TREATMENT PERFORMED: 06/15/24: Therapeutic Exercises Pulleys into flex and abd x 2 mins each returning therapist demo and VC's during to decrease Rt scapular compensation Roll yellow ball up wall into flex and bil abd x 10 each returning therapist demo Therapeutic Activities Supine over half foam roll for following: Bil UE horz abd x 10, bil Ue scaption into a V x 10, and then bil UE abd in a snow angel 5 sec holds, x 10 reps returning therapist demo for each Supine scapular series with yellow theraband: Bil horz abd, bil UE er x 10 each, narrow and wide grip flex, and bil D2 x 5 each returning therapist demo for each and tactile/VC's prn for correct UE position Manual Therapy P/ROM to Rt>Lt shoulders into flex, abd and D2 with scapular depression throughout by therapist STM to bil pect tendons at end P/ROMs MLD to Rt lateral trunk: Rt inguinal nodes, Rt axillo-inguinal anastomosis and then focused on edema inferior to mastectomy incision  06/10/24: Instructed pt in post op exercises and educated pt about lymphedema etc - see education section, cut 1/2 inch grey  foam and placed in thick stockinette for pt to wear in her compression bra for additional compression to help with post op edema   PATIENT EDUCATION:  Education details: Supine scapular series  Person educated: Patient Education method: Explanation, Demonstration, and Handouts Education comprehension: verbalized understanding, VC's and will benefit from review  HOME EXERCISE PROGRAM: Reviewed previously given post op HEP. Walking program Supine scapular series with yellow theraband  ASSESSMENT:  CLINICAL IMPRESSION: Began AA/A/ROM stretches focusing on correct technique. Then progressed HEP to include supine scapular series instructing pt to only do these every other day for now so as not to overload the lymphatics. Then began manual therapy working to decrease bil upper quadrant tightness and also started MLD to Rt inferior mastectomy incision. Pt has been compliant with her HEP this far and reports compression foam in bra is helping as well.   Pt will benefit from skilled therapeutic intervention to improve on the following deficits: Decreased knowledge of precautions, impaired UE functional use, pain, decreased ROM, postural dysfunction.   PT treatment/interventions: ADL/Self care home management, [phone]- PT Re-evaluation, 97110-Therapeutic exercises, 97530- Therapeutic activity, [id number]- Neuromuscular re-education, 97535- Self Care, [id number]- Manual therapy, [phone]- Orthotic Initial, [phone]- Orthotic/Prosthetic subsequent, and Patient/Family education   GOALS: Goals reviewed with patient? Yes  GOALS MET AT EVAL:  GOALS Name Target Date Goal status  1 Pt will be able  to verbalize understanding of pertinent lymphedema risk reduction practices relevant to her dx specifically related to skin care.  Baseline:  No knowledge Eval Achieved at eval  2 Pt will be able to return demo and/or verbalize understanding of the post op HEP related to regaining shoulder ROM. Baseline:  No knowledge Eval  Achieved at eval  3 Pt will be able to verbalize understanding of the importance of viewing the post op After Breast CA Class video for further lymphedema risk reduction education and therapeutic exercise.  Baseline:  No knowledge Eval Achieved at eval   LONG TERM GOALS:  (STG=LTG)  GOALS Name Target Date  Goal status  1 Pt will demonstrate she has regained full shoulder ROM and function post operatively compared to baselines.  Baseline: 07/08/24 INITIAL  2 Pt will be independent in self MLD for management of post op edema.  07/08/24 INITIAL  3 Pt will be independent in a home exercise program for improving posture and for continued stretching and strengthening.  07/08/24 INITIAL     PLAN:  PT FREQUENCY/DURATION: 2x/wk for 4 wks  PLAN FOR NEXT SESSION: Cont pulleys focusing on decreasing scapula compensations, ball, PROM to bilateral shoulders, cont supine scap series, cont ad instruct pt in MLD to Rt chest inferior to scar   [facility]  [address], [address]  [place] [address]  [phone]    Aden Berwyn Caldron, PTA 06/15/2024, 9:07 AM   Over Head Pull: Narrow and Wide Grip   Cancer Rehab [phone]   On back, knees bent, feet flat, band across thighs, elbows straight but relaxed. Pull hands apart (start). Keeping elbows straight, bring arms up and over head, hands toward floor. Keep pull steady on band. Hold momentarily. Return slowly, keeping pull steady, back to start. Then do same with a wider grip on the band (past shoulder width) Repeat _5-10__ times. Band color __yellow____   Side Pull: Double Arm   On back, knees bent, feet flat. Arms perpendicular to body, shoulder level, elbows straight but relaxed. Pull arms out to sides, elbows straight. Resistance band comes across collarbones, hands toward floor. Hold momentarily. Slowly return to starting position. Repeat _5-10__ times. Band color _yellow____   Sword   On back, knees bent, feet  flat, left hand on left hip, right hand above left. Pull right arm DIAGONALLY (hip to shoulder) across chest. Bring right arm along head toward floor. Hold momentarily. Slowly return to starting position. Repeat _5-10__ times. Do with left arm. Band color _yellow_____   Shoulder Rotation: Double Arm   On back, knees bent, feet flat, elbows tucked at sides, bent 90, hands palms up. Pull hands apart and down toward floor, keeping elbows near sides. Hold momentarily. Slowly return to starting position. Repeat _5-10__ times. Band color __yellow____   "

## 2024-06-15 NOTE — Patient Instructions (Addendum)
 SABRA

## 2024-06-17 ENCOUNTER — Ambulatory Visit

## 2024-06-17 DIAGNOSIS — R293 Abnormal posture: Secondary | ICD-10-CM

## 2024-06-17 DIAGNOSIS — C50411 Malignant neoplasm of upper-outer quadrant of right female breast: Secondary | ICD-10-CM

## 2024-06-17 DIAGNOSIS — R600 Localized edema: Secondary | ICD-10-CM

## 2024-06-17 DIAGNOSIS — Z483 Aftercare following surgery for neoplasm: Secondary | ICD-10-CM

## 2024-06-17 DIAGNOSIS — M25611 Stiffness of right shoulder, not elsewhere classified: Secondary | ICD-10-CM

## 2024-06-17 DIAGNOSIS — M25612 Stiffness of left shoulder, not elsewhere classified: Secondary | ICD-10-CM

## 2024-06-17 NOTE — Therapy (Signed)
 " OUTPATIENT PHYSICAL THERAPY BREAST CANCER TREATMENT   Patient Name: Gabrielle Cross MRN: [id number] DOB:[birth year], 59 y.o., female Today's Date: 06/17/2024  END OF SESSION:  PT End of Session - 06/17/24 0758     Visit Number 4    Number of Visits 10    Date for Recertification  07/08/24    PT Start Time 0759    PT Stop Time 0859    PT Time Calculation (min) 60 min    Activity Tolerance Patient tolerated treatment well    Behavior During Therapy [facility] for tasks assessed/performed          Past Medical History:  Diagnosis Date   Arthritis    knees   Blood transfusion without reported diagnosis 2002   Carpal tunnel syndrome, bilateral    Elevated hemoglobin    Hypertension    Incompetent cervix    DELIVERED AT 31 WEEKS   Migraines    OSA (obstructive sleep apnea)    Rx for autopap, does not use   Ruptured ectopic pregnancy 2002   WITH BLOOD TRANSFUSION 2U'S PRBC'S   Wears glasses    Past Surgical History:  Procedure Laterality Date   BREAST BIOPSY Right 10/07/2023   US  RT BREAST BX W LOC DEV 1ST LESION IMG BX SPEC US  GUIDE 10/07/2023 [facility] MAMMOGRAPHY   CERCLAGE PLACEMENT     DILATATION & CURETTAGE/HYSTEROSCOPY WITH MYOSURE N/A 04/19/2022   Procedure: DILATATION & CURETTAGE/HYSTEROSCOPY WITH MYOSURE;  Surgeon: Lavoie, Marie-Lyne, MD;  Location: [facility];  Service: Gynecology;  Laterality: N/A;   IR IMAGING GUIDED PORT INSERTION  11/10/2023   MASTECTOMY W/ SENTINEL NODE BIOPSY Right 05/07/2024   Procedure: MASTECTOMY WITH SENTINEL LYMPH NODE BIOPSY;  Surgeon: Curvin Deward MOULD, MD;  Location: [facility];  Service: General;  Laterality: Right;  GEN w/PEC BLOCK RIGHT MASTECTOMY SENTINEL NODE BIOPSY   PORTACATH PLACEMENT Left 11/05/2023   Procedure: ATTEMPTED INSERTION, TUNNELED CENTRAL VENOUS DEVICE, WITH PORT;  Surgeon: Curvin Deward MOULD, MD;  Location: [facility];  Service: General;  Laterality: Left;  PORT PLACEMENT WITH ULTRASOUND  GUIDANCE   RESECTOSCOPIC POLYPECTOMY  2001   SALPINGECTOMY Right    RIGHT, RUPTURED ECTOPIC   SKIN CANCER EXCISION  2020   tangential bx  02/22/2019   TOTAL MASTECTOMY Left 05/07/2024   Procedure: MASTECTOMY, SIMPLE;  Surgeon: Curvin Deward MOULD, MD;  Location: [facility];  Service: General;  Laterality: Left;  LEFT PROPHYLACTIC MASTECTOMY   Patient Active Problem List   Diagnosis Date Noted   Cancer of right female breast (HCC) 05/07/2024   Chemotherapy induced neutropenia 02/06/2024   Acute folliculitis 12/09/2023   Chronic hyperglycemia 12/09/2023   Diuretic-induced hypokalemia 12/09/2023   Diabetes mellitus (HCC) 12/09/2023   Port-A-Cath in place 11/13/2023   Genetic testing 11/04/2023   Malignant neoplasm of upper-outer quadrant of right breast in female, estrogen receptor positive (HCC) 10/20/2023   Dyslipidemia, goal LDL below 130 09/17/2022   Acne rosacea 08/23/2022   Inflamed seborrheic keratosis 08/23/2022   Keratosis pilaris 08/23/2022   Vitamin D  deficiency 04/19/2021   Loud snoring 04/18/2021   Primary hypertension 04/18/2021   Encounter for general adult medical examination with abnormal findings 04/18/2021   Visit for screening mammogram 04/18/2021   Erythrocytosis 02/22/2021   Squamous cell cancer of skin of left cheek 03/24/2019   Migraines 01/09/2012    REFERRING PROVIDER: Dr. Deward Curvin  REFERRING DIAG: Right breast cancer  THERAPY DIAG:  Stiffness of right shoulder, not  elsewhere classified  Stiffness of left shoulder, not elsewhere classified  Aftercare following surgery for neoplasm  Localized edema  Abnormal posture  Malignant neoplasm of upper-outer quadrant of right breast in female, estrogen receptor positive (HCC)  Rationale for Evaluation and Treatment: Rehabilitation  ONSET DATE: 05/07/2024  SUBJECTIVE:                                                                                                                                                                                            SUBJECTIVE STATEMENT: I was a little more sore than I expected after the last session. And I was surprised to feel it more in the Lt than the Rt. I also found myself catching my posture more often.   PERTINENT HISTORY:  Patient was diagnosed on 09/12/2023 with right grade 3 invasive ductal carcinoma breast cancer. She underwent a bilateral mastectomy with a right sentinel node biopsy (3 negative nodes) on 05/07/2024. It is weakly ER positive, PR and HER2 negative with a Ki67 of 60%.   PATIENT GOALS:  Reassess how my recovery is going related to arm function, pain, and swelling.  PAIN:  Are you having pain? No, just sore at Rt chest and Lt axilla  PRECAUTIONS: Recent Surgery, right UE Lymphedema risk  RED FLAGS: None   ACTIVITY LEVEL / LEISURE: pt is not currently exercising    OBJECTIVE:   PATIENT SURVEYS:  QUICK DASH:     OBSERVATIONS: Bilaeral healing mastectomy scars with glue still intact, some mild post op swelling inferior to R mastectomy   POSTURE:  Forward head, rounded shoulders  LYMPHEDEMA ASSESSMENT:   UPPER EXTREMITY AROM/PROM:   A/PROM RIGHT   eval   RIGHT 06/10/2024  Shoulder extension 45 50  Shoulder flexion 158 142  Shoulder abduction 160 130  Shoulder internal rotation 65 67  Shoulder external rotation 80 84                          (Blank rows = not tested)   A/PROM LEFT   eval LEFT 06/10/24  Shoulder extension 42 48  Shoulder flexion 141 155 with pulling  Shoulder abduction 163 156 with pulling  Shoulder internal rotation 58 55  Shoulder external rotation 81 89                          (Blank rows = not tested)   CERVICAL AROM: All within normal limits   UPPER EXTREMITY STRENGTH: WFL   LYMPHEDEMA ASSESSMENTS (in cm):    LANDMARK RIGHT   eval RIGHT 06/10/2024  10  cm proximal to olecranon process 32.8 29.3  Olecranon process 26.9 26  10  cm proximal to ulnar styloid process 26  23.5  Just proximal to ulnar styloid process 17.6 16  Across hand at thumb web space 19.9 19.2  At base of 2nd digit 6.2 6  (Blank rows = not tested)   LANDMARK LEFT   eval LEFT 06/10/2024  10 cm proximal to olecranon process 32.7 29.9  Olecranon process 26.6 26.4  10 cm proximal to ulnar styloid process 24.5 23.6  Just proximal to ulnar styloid process 18.5 16  Across hand at thumb web space 18.2 18  At base of 2nd digit 5.9 6  (Blank rows = not tested)  Surgery type/Date: 05/07/2024 bil mastectomy with right sentinel node biopsy Number of lymph nodes removed: 3 Current/past treatment (chemo, radiation, hormone therapy): chemo completed 03/31/2024; currently on Keytruda .  Other symptoms:  Heaviness/tightness Yes - tightness at R pec Pain No Pitting edema No Infections No Decreased scar mobility No Stemmer sign No  TREATMENT PERFORMED: 06/17/24: Therapeutic Exercises Pulleys into flex and abd x 3 min total returning therapist demo and VC's during to decrease Rt scapular compensation Roll yellow ball up wall into flex and bil abd x 10 each returning therapist demo Therapeutic Activities Modified downward dog on wall x 5 reps, 5 sec holds Supine over half foam roll for following: Bil UE horz abd x 10, bil UE scaption into a V x 10, and then bil UE abd in a snow angel 5 sec holds, x 10 reps returning therapist demo for each, pt reports these stretches feeling a bit tighter today than at last session due to soreness and was encouraged to perform them slower and to focus on decreasing guarded posturing Manual Therapy P/ROM to bil shoulders into flex, abd and D2 with scapular depression throughout by therapist STM to bil pect tendons at end P/ROMs, also began gentle scar mobs and instructed pt in this while performing  MFR to bil chest walls during P/ROM and on Rt side inferior to incision where pt reports increased tightness noted during stretches MLD to Rt lateral trunk: Rt  inguinal nodes, Rt axillo-inguinal anastomosis and then focused on edema inferior to mastectomy incision which is improved today. Educated pt in this while performing and had her return demo with tactile cues for correct light pressure  06/15/24: Therapeutic Exercises Pulleys into flex and abd x 2 mins each returning therapist demo and VC's during to decrease Rt scapular compensation Roll yellow ball up wall into flex and bil abd x 10 each returning therapist demo Therapeutic Activities Supine over half foam roll for following: Bil UE horz abd x 10, bil UE scaption into a V x 10, and then bil UE abd in a snow angel 5 sec holds, x 10 reps returning therapist demo for each Supine scapular series with yellow theraband: Bil horz abd, bil UE er x 10 each, narrow and wide grip flex, and bil D2 x 5 each returning therapist demo for each and tactile/VC's prn for correct UE position Manual Therapy P/ROM to Rt>Lt shoulders into flex, abd and D2 with scapular depression throughout by therapist STM to bil pect tendons at end P/ROMs MLD to Rt lateral trunk: Rt inguinal nodes, Rt axillo-inguinal anastomosis and then focused on edema inferior to mastectomy incision  06/10/24: Instructed pt in post op exercises and educated pt about lymphedema etc - see education section, cut 1/2 inch grey foam and placed in thick stockinette for pt to  wear in her compression bra for additional compression to help with post op edema   PATIENT EDUCATION:  Education details: Supine scapular series  Person educated: Patient Education method: Explanation, Demonstration, and Handouts Education comprehension: verbalized understanding, VC's and will benefit from review  HOME EXERCISE PROGRAM: Reviewed previously given post op HEP. Walking program Supine scapular series with yellow theraband  ASSESSMENT:  CLINICAL IMPRESSION: Pt comes in reporting noting increased soreness, but not unreason ably so, after last session. Today  continued with AA/A/ROM stretches and manual therapy working to decrease bil upper quadrant tightness. Also instructed pt in self MLD to Rt lateral trunk having her return demo and ok to begin scar mobs with best time being after she showers before and after applying her coconut oil.   Pt will benefit from skilled therapeutic intervention to improve on the following deficits: Decreased knowledge of precautions, impaired UE functional use, pain, decreased ROM, postural dysfunction.   PT treatment/interventions: ADL/Self care home management, [phone]- PT Re-evaluation, 97110-Therapeutic exercises, 97530- Therapeutic activity, [id number]- Neuromuscular re-education, 97535- Self Care, [id number]- Manual therapy, [phone]- Orthotic Initial, [phone]- Orthotic/Prosthetic subsequent, and Patient/Family education   GOALS: Goals reviewed with patient? Yes  GOALS MET AT EVAL:  GOALS Name Target Date Goal status  1 Pt will be able to verbalize understanding of pertinent lymphedema risk reduction practices relevant to her dx specifically related to skin care.  Baseline:  No knowledge Eval Achieved at eval  2 Pt will be able to return demo and/or verbalize understanding of the post op HEP related to regaining shoulder ROM. Baseline:  No knowledge Eval Achieved at eval  3 Pt will be able to verbalize understanding of the importance of viewing the post op After Breast CA Class video for further lymphedema risk reduction education and therapeutic exercise.  Baseline:  No knowledge Eval Achieved at eval   LONG TERM GOALS:  (STG=LTG)  GOALS Name Target Date  Goal status  1 Pt will demonstrate she has regained full shoulder ROM and function post operatively compared to baselines.  Baseline: 07/08/24 INITIAL  2 Pt will be independent in self MLD for management of post op edema.  07/08/24 INITIAL  3 Pt will be independent in a home exercise program for improving posture and for continued stretching and strengthening.  07/08/24  INITIAL     PLAN:  PT FREQUENCY/DURATION: 2x/wk for 4 wks  PLAN FOR NEXT SESSION: Cont pulleys focusing on decreasing scapula compensations, ball roll up wall , incorporate more scar massage with cocoa butter next, PROM to bilateral shoulders, cont supine scap series, cont and instruct pt in MLD to Rt chest inferior to scar prn   [facility]  [address]  [place] [address]  [phone]    Aden Berwyn Caldron, PTA 06/17/2024, 9:11 AM   Over Head Pull: Narrow and Wide Grip   Cancer Rehab [phone]   On back, knees bent, feet flat, band across thighs, elbows straight but relaxed. Pull hands apart (start). Keeping elbows straight, bring arms up and over head, hands toward floor. Keep pull steady on band. Hold momentarily. Return slowly, keeping pull steady, back to start. Then do same with a wider grip on the band (past shoulder width) Repeat _5-10__ times. Band color __yellow____   Side Pull: Double Arm   On back, knees bent, feet flat. Arms perpendicular to body, shoulder level, elbows straight but relaxed. Pull arms out to sides, elbows straight. Resistance band comes across collarbones, hands toward floor.  Hold momentarily. Slowly return to starting position. Repeat _5-10__ times. Band color _yellow____   Sword   On back, knees bent, feet flat, left hand on left hip, right hand above left. Pull right arm DIAGONALLY (hip to shoulder) across chest. Bring right arm along head toward floor. Hold momentarily. Slowly return to starting position. Repeat _5-10__ times. Do with left arm. Band color _yellow_____   Shoulder Rotation: Double Arm   On back, knees bent, feet flat, elbows tucked at sides, bent 90, hands palms up. Pull hands apart and down toward floor, keeping elbows near sides. Hold momentarily. Slowly return to starting position. Repeat _5-10__ times. Band color __yellow____   "

## 2024-06-23 ENCOUNTER — Ambulatory Visit: Admitting: Physical Therapy

## 2024-06-23 ENCOUNTER — Encounter: Payer: Self-pay | Admitting: Physical Therapy

## 2024-06-23 DIAGNOSIS — M25611 Stiffness of right shoulder, not elsewhere classified: Secondary | ICD-10-CM

## 2024-06-23 DIAGNOSIS — R293 Abnormal posture: Secondary | ICD-10-CM

## 2024-06-23 DIAGNOSIS — C50411 Malignant neoplasm of upper-outer quadrant of right female breast: Secondary | ICD-10-CM | POA: Diagnosis not present

## 2024-06-23 DIAGNOSIS — Z483 Aftercare following surgery for neoplasm: Secondary | ICD-10-CM

## 2024-06-23 DIAGNOSIS — Z170 Estrogen receptor positive status [ER+]: Secondary | ICD-10-CM

## 2024-06-23 DIAGNOSIS — R600 Localized edema: Secondary | ICD-10-CM

## 2024-06-23 DIAGNOSIS — M25612 Stiffness of left shoulder, not elsewhere classified: Secondary | ICD-10-CM

## 2024-06-23 NOTE — Therapy (Signed)
 " OUTPATIENT PHYSICAL THERAPY BREAST CANCER TREATMENT   Patient Name: Gabrielle Cross MRN: [id number] DOB:Jul 07, 1965, 59 y.o., female Today's Date: 06/23/2024  END OF SESSION:  PT End of Session - 06/23/24 0807     Visit Number 5    Number of Visits 10    Date for Recertification  07/08/24    PT Start Time 0804    PT Stop Time 0853    PT Time Calculation (min) 49 min    Activity Tolerance Patient tolerated treatment well    Behavior During Therapy [facility] for tasks assessed/performed          Past Medical History:  Diagnosis Date   Arthritis    knees   Blood transfusion without reported diagnosis 2002   Carpal tunnel syndrome, bilateral    Elevated hemoglobin    Hypertension    Incompetent cervix    DELIVERED AT 31 WEEKS   Migraines    OSA (obstructive sleep apnea)    Rx for autopap, does not use   Ruptured ectopic pregnancy 2002   WITH BLOOD TRANSFUSION 2U'S PRBC'S   Wears glasses    Past Surgical History:  Procedure Laterality Date   BREAST BIOPSY Right 10/07/2023   US  RT BREAST BX W LOC DEV 1ST LESION IMG BX SPEC US  GUIDE 10/07/2023 [facility] MAMMOGRAPHY   CERCLAGE PLACEMENT     DILATATION & CURETTAGE/HYSTEROSCOPY WITH MYOSURE N/A 04/19/2022   Procedure: DILATATION & CURETTAGE/HYSTEROSCOPY WITH MYOSURE;  Surgeon: Lavoie, Marie-Lyne, MD;  Location: [facility];  Service: Gynecology;  Laterality: N/A;   IR IMAGING GUIDED PORT INSERTION  11/10/2023   MASTECTOMY W/ SENTINEL NODE BIOPSY Right 05/07/2024   Procedure: MASTECTOMY WITH SENTINEL LYMPH NODE BIOPSY;  Surgeon: Curvin Deward MOULD, MD;  Location: [facility];  Service: General;  Laterality: Right;  GEN w/PEC BLOCK RIGHT MASTECTOMY SENTINEL NODE BIOPSY   PORTACATH PLACEMENT Left 11/05/2023   Procedure: ATTEMPTED INSERTION, TUNNELED CENTRAL VENOUS DEVICE, WITH PORT;  Surgeon: Curvin Deward MOULD, MD;  Location: [facility];  Service: General;  Laterality: Left;  PORT PLACEMENT WITH ULTRASOUND  GUIDANCE   RESECTOSCOPIC POLYPECTOMY  2001   SALPINGECTOMY Right    RIGHT, RUPTURED ECTOPIC   SKIN CANCER EXCISION  2020   tangential bx  02/22/2019   TOTAL MASTECTOMY Left 05/07/2024   Procedure: MASTECTOMY, SIMPLE;  Surgeon: Curvin Deward MOULD, MD;  Location: [facility];  Service: General;  Laterality: Left;  LEFT PROPHYLACTIC MASTECTOMY   Patient Active Problem List   Diagnosis Date Noted   Cancer of right female breast (HCC) 05/07/2024   Chemotherapy induced neutropenia 02/06/2024   Acute folliculitis 12/09/2023   Chronic hyperglycemia 12/09/2023   Diuretic-induced hypokalemia 12/09/2023   Diabetes mellitus (HCC) 12/09/2023   Port-A-Cath in place 11/13/2023   Genetic testing 11/04/2023   Malignant neoplasm of upper-outer quadrant of right breast in female, estrogen receptor positive (HCC) 10/20/2023   Dyslipidemia, goal LDL below 130 09/17/2022   Acne rosacea 08/23/2022   Inflamed seborrheic keratosis 08/23/2022   Keratosis pilaris 08/23/2022   Vitamin D  deficiency 04/19/2021   Loud snoring 04/18/2021   Primary hypertension 04/18/2021   Encounter for general adult medical examination with abnormal findings 04/18/2021   Visit for screening mammogram 04/18/2021   Erythrocytosis 02/22/2021   Squamous cell cancer of skin of left cheek 03/24/2019   Migraines 01/09/2012    REFERRING PROVIDER: Dr. Deward Curvin  REFERRING DIAG: Right breast cancer  THERAPY DIAG:  Stiffness of right shoulder, not  elsewhere classified  Stiffness of left shoulder, not elsewhere classified  Aftercare following surgery for neoplasm  Localized edema  Abnormal posture  Malignant neoplasm of upper-outer quadrant of right breast in female, estrogen receptor positive (HCC)  Rationale for Evaluation and Treatment: Rehabilitation  ONSET DATE: 05/07/2024  SUBJECTIVE:                                                                                                                                                                                            SUBJECTIVE STATEMENT: I was not sore after last time.   PERTINENT HISTORY:  Patient was diagnosed on 09/12/2023 with right grade 3 invasive ductal carcinoma breast cancer. She underwent a bilateral mastectomy with a right sentinel node biopsy (3 negative nodes) on 05/07/2024. It is weakly ER positive, PR and HER2 negative with a Ki67 of 60%.   PATIENT GOALS:  Reassess how my recovery is going related to arm function, pain, and swelling.  PAIN:  Are you having pain? No  PRECAUTIONS: Recent Surgery, right UE Lymphedema risk  RED FLAGS: None   ACTIVITY LEVEL / LEISURE: pt is not currently exercising    OBJECTIVE:   PATIENT SURVEYS:  QUICK DASH:     OBSERVATIONS: Bilaeral healing mastectomy scars with glue still intact, some mild post op swelling inferior to R mastectomy   POSTURE:  Forward head, rounded shoulders  LYMPHEDEMA ASSESSMENT:   UPPER EXTREMITY AROM/PROM:   A/PROM RIGHT   eval   RIGHT 06/10/2024  Shoulder extension 45 50  Shoulder flexion 158 142  Shoulder abduction 160 130  Shoulder internal rotation 65 67  Shoulder external rotation 80 84                          (Blank rows = not tested)   A/PROM LEFT   eval LEFT 06/10/24  Shoulder extension 42 48  Shoulder flexion 141 155 with pulling  Shoulder abduction 163 156 with pulling  Shoulder internal rotation 58 55  Shoulder external rotation 81 89                          (Blank rows = not tested)   CERVICAL AROM: All within normal limits   UPPER EXTREMITY STRENGTH: WFL   LYMPHEDEMA ASSESSMENTS (in cm):    LANDMARK RIGHT   eval RIGHT 06/10/2024  10 cm proximal to olecranon process 32.8 29.3  Olecranon process 26.9 26  10  cm proximal to ulnar styloid process 26 23.5  Just proximal to ulnar styloid process 17.6 16  Across hand at thumb web  space 19.9 19.2  At base of 2nd digit 6.2 6  (Blank rows = not tested)   LANDMARK LEFT   eval  LEFT 06/10/2024  10 cm proximal to olecranon process 32.7 29.9  Olecranon process 26.6 26.4  10 cm proximal to ulnar styloid process 24.5 23.6  Just proximal to ulnar styloid process 18.5 16  Across hand at thumb web space 18.2 18  At base of 2nd digit 5.9 6  (Blank rows = not tested)  Surgery type/Date: 05/07/2024 bil mastectomy with right sentinel node biopsy Number of lymph nodes removed: 3 Current/past treatment (chemo, radiation, hormone therapy): chemo completed 03/31/2024; currently on Keytruda .  Other symptoms:  Heaviness/tightness Yes - tightness at R pec Pain No Pitting edema No Infections No Decreased scar mobility No Stemmer sign No  TREATMENT PERFORMED: 06/23/24: Therapeutic Exercises Pulleys into flex and abd x 2 total returning therapist demo and VC's during to decrease Rt scapular compensation Roll yellow ball up wall into flex and bil abd x 10 each returning therapist demo Therapeutic Activities Supine over half foam roll for following: alternating bilateral UE flexion x 10,  bil UE scaption into a V x 10, and then bil UE abd in a snow angel 5 sec holds, x 10 reps returning therapist demo for each On half foam roll: supine scap series with yellow band x 10 reps each with pt returning therapist demo: narrow and wide grip flexion, horizontal abduction, ER and diagonals Manual Therapy P/ROM to bil shoulders into flex and abduction with axillary pinning on R to increase stretch Gentle scar mobilization MFR to bil chest walls during P/ROM and on Rt side inferior to incision where pt reports increased tightness noted during stretches  06/17/24: Therapeutic Exercises Pulleys into flex and abd x 3 min total returning therapist demo and VC's during to decrease Rt scapular compensation Roll yellow ball up wall into flex and bil abd x 10 each returning therapist demo Therapeutic Activities Modified downward dog on wall x 5 reps, 5 sec holds Supine over half foam roll for  following: Bil UE horz abd x 10, bil UE scaption into a V x 10, and then bil UE abd in a snow angel 5 sec holds, x 10 reps returning therapist demo for each, pt reports these stretches feeling a bit tighter today than at last session due to soreness and was encouraged to perform them slower and to focus on decreasing guarded posturing Manual Therapy P/ROM to bil shoulders into flex, abd and D2 with scapular depression throughout by therapist STM to bil pect tendons at end P/ROMs, also began gentle scar mobs and instructed pt in this while performing  MFR to bil chest walls during P/ROM and on Rt side inferior to incision where pt reports increased tightness noted during stretches MLD to Rt lateral trunk: Rt inguinal nodes, Rt axillo-inguinal anastomosis and then focused on edema inferior to mastectomy incision which is improved today. Educated pt in this while performing and had her return demo with tactile cues for correct light pressure  06/15/24: Therapeutic Exercises Pulleys into flex and abd x 2 mins each returning therapist demo and VC's during to decrease Rt scapular compensation Roll yellow ball up wall into flex and bil abd x 10 each returning therapist demo Therapeutic Activities Supine over half foam roll for following: Bil UE horz abd x 10, bil UE scaption into a V x 10, and then bil UE abd in a snow angel 5 sec holds, x 10 reps returning  therapist demo for each Supine scapular series with yellow theraband: Bil horz abd, bil UE er x 10 each, narrow and wide grip flex, and bil D2 x 5 each returning therapist demo for each and tactile/VC's prn for correct UE position Manual Therapy P/ROM to Rt>Lt shoulders into flex, abd and D2 with scapular depression throughout by therapist STM to bil pect tendons at end P/ROMs MLD to Rt lateral trunk: Rt inguinal nodes, Rt axillo-inguinal anastomosis and then focused on edema inferior to mastectomy incision  06/10/24: Instructed pt in post op  exercises and educated pt about lymphedema etc - see education section, cut 1/2 inch grey foam and placed in thick stockinette for pt to wear in her compression bra for additional compression to help with post op edema   PATIENT EDUCATION:  Education details: Supine scapular series  Person educated: Patient Education method: Explanation, Demonstration, and Handouts Education comprehension: verbalized understanding, VC's and will benefit from review  HOME EXERCISE PROGRAM: Reviewed previously given post op HEP. Walking program Supine scapular series with yellow theraband  ASSESSMENT:  CLINICAL IMPRESSION: Pt did not have any soreness after last session so resumed supine scapular series. Pt did not have any pain during today's session. Issued yellow band and encouraged pt to try band exercises at home in 2 days if she has no soreness. Today she had increased tightness on her R side.   Pt will benefit from skilled therapeutic intervention to improve on the following deficits: Decreased knowledge of precautions, impaired UE functional use, pain, decreased ROM, postural dysfunction.   PT treatment/interventions: ADL/Self care home management, [phone]- PT Re-evaluation, 97110-Therapeutic exercises, 97530- Therapeutic activity, [id number]- Neuromuscular re-education, 97535- Self Care, [id number]- Manual therapy, [phone]- Orthotic Initial, [phone]- Orthotic/Prosthetic subsequent, and Patient/Family education   GOALS: Goals reviewed with patient? Yes  GOALS MET AT EVAL:  GOALS Name Target Date Goal status  1 Pt will be able to verbalize understanding of pertinent lymphedema risk reduction practices relevant to her dx specifically related to skin care.  Baseline:  No knowledge Eval Achieved at eval  2 Pt will be able to return demo and/or verbalize understanding of the post op HEP related to regaining shoulder ROM. Baseline:  No knowledge Eval Achieved at eval  3 Pt will be able to verbalize understanding of  the importance of viewing the post op After Breast CA Class video for further lymphedema risk reduction education and therapeutic exercise.  Baseline:  No knowledge Eval Achieved at eval   LONG TERM GOALS:  (STG=LTG)  GOALS Name Target Date  Goal status  1 Pt will demonstrate she has regained full shoulder ROM and function post operatively compared to baselines.  Baseline: 07/08/24 INITIAL  2 Pt will be independent in self MLD for management of post op edema.  07/08/24 INITIAL  3 Pt will be independent in a home exercise program for improving posture and for continued stretching and strengthening.  07/08/24 INITIAL     PLAN:  PT FREQUENCY/DURATION: 2x/wk for 4 wks  PLAN FOR NEXT SESSION: Cont pulleys focusing on decreasing scapula compensations, ball roll up wall , incorporate more scar massage with cocoa butter next, PROM to bilateral shoulders, cont supine scap series, cont and instruct pt in MLD to Rt chest inferior to scar prn   [facility]  [address]  [place] [address]  [phone]    Florina Sever Seiling, PT 06/23/2024, 8:59 AM   Over Head Pull: Narrow and Wide Grip   Cancer  Rehab [phone]   On back, knees bent, feet flat, band across thighs, elbows straight but relaxed. Pull hands apart (start). Keeping elbows straight, bring arms up and over head, hands toward floor. Keep pull steady on band. Hold momentarily. Return slowly, keeping pull steady, back to start. Then do same with a wider grip on the band (past shoulder width) Repeat _5-10__ times. Band color __yellow____   Side Pull: Double Arm   On back, knees bent, feet flat. Arms perpendicular to body, shoulder level, elbows straight but relaxed. Pull arms out to sides, elbows straight. Resistance band comes across collarbones, hands toward floor. Hold momentarily. Slowly return to starting position. Repeat _5-10__ times. Band color _yellow____   Sword   On back, knees bent,  feet flat, left hand on left hip, right hand above left. Pull right arm DIAGONALLY (hip to shoulder) across chest. Bring right arm along head toward floor. Hold momentarily. Slowly return to starting position. Repeat _5-10__ times. Do with left arm. Band color _yellow_____   Shoulder Rotation: Double Arm   On back, knees bent, feet flat, elbows tucked at sides, bent 90, hands palms up. Pull hands apart and down toward floor, keeping elbows near sides. Hold momentarily. Slowly return to starting position. Repeat _5-10__ times. Band color __yellow____   "

## 2024-06-24 ENCOUNTER — Inpatient Hospital Stay

## 2024-06-24 ENCOUNTER — Inpatient Hospital Stay: Admitting: Hematology and Oncology

## 2024-06-24 VITALS — BP 124/72 | HR 78 | Temp 98.3°F | Resp 18 | Wt 171.3 lb

## 2024-06-24 DIAGNOSIS — C50411 Malignant neoplasm of upper-outer quadrant of right female breast: Secondary | ICD-10-CM

## 2024-06-24 DIAGNOSIS — Z170 Estrogen receptor positive status [ER+]: Secondary | ICD-10-CM | POA: Diagnosis not present

## 2024-06-24 DIAGNOSIS — Z5112 Encounter for antineoplastic immunotherapy: Secondary | ICD-10-CM | POA: Diagnosis not present

## 2024-06-24 LAB — CBC WITH DIFFERENTIAL (CANCER CENTER ONLY)
Abs Immature Granulocytes: 0.03 K/uL (ref 0.00–0.07)
Basophils Absolute: 0 K/uL (ref 0.0–0.1)
Basophils Relative: 1 %
Eosinophils Absolute: 0.5 K/uL (ref 0.0–0.5)
Eosinophils Relative: 11 %
HCT: 37.8 % (ref 36.0–46.0)
Hemoglobin: 12.2 g/dL (ref 12.0–15.0)
Immature Granulocytes: 1 %
Lymphocytes Relative: 37 %
Lymphs Abs: 1.8 K/uL (ref 0.7–4.0)
MCH: 26.9 pg (ref 26.0–34.0)
MCHC: 32.3 g/dL (ref 30.0–36.0)
MCV: 83.3 fL (ref 80.0–100.0)
Monocytes Absolute: 0.5 K/uL (ref 0.1–1.0)
Monocytes Relative: 10 %
Neutro Abs: 2 K/uL (ref 1.7–7.7)
Neutrophils Relative %: 40 %
Platelet Count: 274 K/uL (ref 150–400)
RBC: 4.54 MIL/uL (ref 3.87–5.11)
RDW: 13.8 % (ref 11.5–15.5)
WBC Count: 4.8 K/uL (ref 4.0–10.5)
nRBC: 0 % (ref 0.0–0.2)

## 2024-06-24 LAB — CMP (CANCER CENTER ONLY)
ALT: 34 U/L (ref 0–44)
AST: 27 U/L (ref 15–41)
Albumin: 4.1 g/dL (ref 3.5–5.0)
Alkaline Phosphatase: 85 U/L (ref 38–126)
Anion gap: 11 (ref 5–15)
BUN: 12 mg/dL (ref 6–20)
CO2: 25 mmol/L (ref 22–32)
Calcium: 9.3 mg/dL (ref 8.9–10.3)
Chloride: 104 mmol/L (ref 98–111)
Creatinine: 0.73 mg/dL (ref 0.44–1.00)
GFR, Estimated: >60 mL/min
Glucose, Bld: 106 mg/dL — ABNORMAL HIGH (ref 70–99)
Potassium: 3.8 mmol/L (ref 3.5–5.1)
Sodium: 139 mmol/L (ref 135–145)
Total Bilirubin: 0.3 mg/dL (ref 0.0–1.2)
Total Protein: 6.8 g/dL (ref 6.5–8.1)

## 2024-06-24 MED ORDER — SODIUM CHLORIDE 0.9 % IV SOLN
200.0000 mg | Freq: Once | INTRAVENOUS | Status: AC
  Administered 2024-06-24: 200 mg via INTRAVENOUS
  Filled 2024-06-24: qty 200

## 2024-06-24 MED ORDER — SODIUM CHLORIDE 0.9 % IV SOLN: INTRAVENOUS | Status: DC

## 2024-06-24 NOTE — Assessment & Plan Note (Signed)
 10/07/2023:Screening mammogram detected right breast mass at 12:00 measuring 5.1 cm, 1 axillary lymph node biopsy was benign biopsy of the mass grade 3 IDC with necrosis ER 30% weak, PR 0%, Ki67 60%, HER2 negative   Treatment plan: Neoadjuvant chemotherapy with Taxol  carbo Keytruda  followed by Adriamycin  Cytoxan  Keytruda  11/06/2023-03/11/2024 05/07/2024: Bilateral mastectomies: Left breast: Benign, right mastectomy: Benign no residual cancer, 0/3 lymph nodes negative (pathologic complete response) Adjuvant radiation therapy Antiestrogen therapy CT CAP and bone scan 11/04/2023: 4.7 cm right breast mass, right axillary lymph node 1.8 cm, 0.2 cm lung nodule benign Breast MRI 10/30/2023: Right breast malignancy 4.7 cm, enlarged right axillary lymph node ------------------------------------------------------------------------------------------------------------------------------------------------ Current treatment: Keytruda  maintenance Toxicities: Tolerating extremely well Return to clinic every 3 weeks for Keytruda 

## 2024-06-24 NOTE — Progress Notes (Signed)
 "  Patient Care Team: [name], NP as PCP - General (Obstetrics and Gynecology) Odean Potts, MD as Consulting Physician (Hematology and Oncology) Tyree Nanetta SAILOR, RN as Oncology Nurse Navigator Curvin Deward MOULD, MD as Consulting Physician (General Surgery) Izell Domino, MD as Attending Physician (Radiation Oncology)  DIAGNOSIS:  Encounter Diagnosis  Name Primary?   Malignant neoplasm of upper-outer quadrant of right breast in female, estrogen receptor positive (HCC) Yes    SUMMARY OF ONCOLOGIC HISTORY: Oncology History  Malignant neoplasm of upper-outer quadrant of right breast in female, estrogen receptor positive (HCC)  10/07/2023 Initial Diagnosis   Screening mammogram detected right breast mass at 12:00 measuring 5.1 cm, 1 axillary lymph node biopsy was benign biopsy of the mass grade 3 IDC with necrosis ER 30% weak, PR 0%, Ki67 60%, HER2 negative   10/22/2023 Cancer Staging   Staging form: Breast, AJCC 8th Edition - Clinical stage from 10/22/2023: Stage IIIA (cT3, cN0, cM0, G3, ER+, PR-, HER2-) - Signed by Odean Potts, MD on 10/22/2023 Stage prefix: Initial diagnosis Histologic grading system: 3 grade system Laterality: Right Staged by: Pathologist and managing physician Stage used in treatment planning: Yes National guidelines used in treatment planning: Yes Type of national guideline used in treatment planning: NCCN   11/02/2023 Genetic Testing   Negative [facility] CancerNext +RNAinsight Panel.  VUS in BARD1 at p.K438E (c.1312A>G).  Report date is 11/02/2023.   The [employer] CancerNext+RNAinsight Panel includes sequencing, rearrangement analysis, and RNA analysis for the following 40 genes: APC, ATM, BAP1, BARD1, BMPR1A, BRCA1, BRCA2, BRIP1, CDH1, CDKN2A, CHEK2, FH, FLCN, MET, MLH1, MSH2, MSH6, MUTYH, NF1, NTHL1, PALB2, PMS2, PTEN, RAD51C, RAD51D, RSP20, SMAD4, STK11, TP53, TSC1, TSC2, and VHL (sequencing and deletion/duplication); AXIN2, HOXB13, MBD4, MSH3, POLD1 and POLE  (sequencing only); EPCAM and GREM1 (deletion/duplication only).   11/04/2023 Imaging   CT CAP/bone scan: 4.7 cm right breast mass, right axillary lymph node 1.8 cm, 0.2 cm lung nodule benign    11/06/2023 - 03/31/2024 Chemotherapy   Patient is on Treatment Plan : BREAST Pembrolizumab  (200) D1 + Carboplatin  (1.5) D1,8,15 + Paclitaxel  (80) D1,8,15 q21d X 4 cycles / Pembrolizumab  (200) D1 + AC D1 q21d x 4 cycles     03/07/2024 Surgery   Bilateral mastectomies: Left breast: Benign, right mastectomy: Benign no residual cancer, 0/3 lymph nodes negative (pathologic complete response)    04/22/2024 -  Chemotherapy   Patient is on Treatment Plan : BREAST Pembrolizumab  (200) q21d x 27 weeks       CHIEF COMPLIANT: Follow-up on Keytruda   HISTORY OF PRESENT ILLNESS:   History of Present Illness Gabrielle Cross is a 59 year old female with stage IIIA estrogen receptor-positive invasive ductal carcinoma of the right breast, status post neoadjuvant chemoimmunotherapy and mastectomy, presenting for routine oncology follow-up.  She is receiving maintenance pembrolizumab  without reported side effects. Recent labs show recovered blood counts, electrolytes, and renal and hepatic function.  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 Hair loss has stabilized with early regrowth. Her prior chemotherapy-related anemia has resolved, and she no longer requires potassium supplementation.  She started physical therapy for post-mastectomy tightness with improved mobility and activity. She has stopped Robaxin . She continues antihypertensive and lipid-lowering medications.       ALLERGIES:  is allergic to shellfish allergy.  MEDICATIONS:  Current Outpatient Medications  Medication Sig Dispense Refill   hydrochlorothiazide  (HYDRODIURIL ) 12.5 MG tablet Take 1 tablet (12.5 mg total) by mouth daily. 90 tablet 4   methocarbamol  (ROBAXIN ) 500 MG tablet Take 1  tablet (500 mg total) by mouth every 6 (six)  hours as needed for muscle spasms. 30 tablet 2   oxyCODONE  (OXY IR/ROXICODONE ) 5 MG immediate release tablet Take 1 tablet (5 mg total) by mouth every 6 (six) hours as needed for moderate pain (pain score 4-6). 10 tablet 0   potassium chloride  (MICRO-K ) 10 MEQ CR capsule Take one (1) tablet by mouth twice daily for one week, then take one (1) tablet daily. 45 capsule 0   rosuvastatin  (CRESTOR ) 10 MG tablet Take 1 tablet (10 mg total) by mouth daily. 90 tablet 0   No current facility-administered medications for this visit.    PHYSICAL EXAMINATION: ECOG PERFORMANCE STATUS: 1 - Symptomatic but completely ambulatory  Vitals:   06/24/24 1108  BP: 124/72  Pulse: 78  Resp: 18  Temp: 98.3 F (36.8 C)  SpO2: 100%   Filed Weights   06/24/24 1108  Weight: 171 lb 4.8 oz (77.7 kg)    Physical Exam   (exam performed in the presence of a chaperone)  LABORATORY DATA:  I have reviewed the data as listed    Latest Ref Rng & Units 06/24/2024   10:40 AM 06/04/2024   10:31 AM 05/13/2024   10:50 AM  CMP  Glucose 70 - 99 mg/dL 893  895  898   BUN 6 - 20 mg/dL 12  14  13    Creatinine 0.44 - 1.00 mg/dL 9.26  9.32  9.31   Sodium 135 - 145 mmol/L 139  139  139   Potassium 3.5 - 5.1 mmol/L 3.8  3.7  3.9   Chloride 98 - 111 mmol/L 104  105  102   CO2 22 - 32 mmol/L 25  23  25    Calcium  8.9 - 10.3 mg/dL 9.3  9.4  9.8   Total Protein 6.5 - 8.1 g/dL 6.8  6.9  7.3   Total Bilirubin 0.0 - 1.2 mg/dL 0.3  0.3  0.3   Alkaline Phos 38 - 126 U/L 85  85  91   AST 15 - 41 U/L 27  23  20    ALT 0 - 44 U/L 34  27  25     Lab Results  Component Value Date   WBC 4.8 06/24/2024   HGB 12.2 06/24/2024   HCT 37.8 06/24/2024   MCV 83.3 06/24/2024   PLT 274 06/24/2024   NEUTROABS 2.0 06/24/2024    ASSESSMENT & PLAN:  Malignant neoplasm of upper-outer quadrant of right breast in female, estrogen receptor positive (HCC) 10/07/2023:Screening mammogram detected right breast mass at 12:00 measuring 5.1 cm, 1  axillary lymph node biopsy was benign biopsy of the mass grade 3 IDC with necrosis ER 30% weak, PR 0%, Ki67 60%, HER2 negative   Treatment plan: Neoadjuvant chemotherapy with Taxol  carbo Keytruda  followed by Adriamycin  Cytoxan  Keytruda  11/06/2023-03/11/2024 05/07/2024: Bilateral mastectomies: Left breast: Benign, right mastectomy: Benign no residual cancer, 0/3 lymph nodes negative (pathologic complete response) Adjuvant radiation therapy Antiestrogen therapy CT CAP and bone scan 11/04/2023: 4.7 cm right breast mass, right axillary lymph node 1.8 cm, 0.2 cm lung nodule benign Breast MRI 10/30/2023: Right breast malignancy 4.7 cm, enlarged right axillary lymph node ------------------------------------------------------------------------------------------------------------------------------------------------ Current treatment: Keytruda  maintenance Toxicities: Tolerating extremely well Return to clinic every 3 weeks for Keytruda      No orders of the defined types were placed in this encounter.  The patient has a good understanding of the overall plan. she agrees with it. she will call with any problems that may  develop before the next visit here.  I personally spent a total of 30 minutes in the care of the patient today including preparing to see the patient, getting/reviewing separately obtained history, performing a medically appropriate exam/evaluation, counseling and educating, placing orders, referring and communicating with other health care professionals, documenting clinical information in the EHR, independently interpreting results, communicating results, and coordinating care.   Viinay K Zelene Barga, MD 06/24/24    "

## 2024-06-25 ENCOUNTER — Ambulatory Visit

## 2024-06-28 ENCOUNTER — Encounter: Payer: Self-pay | Admitting: *Deleted

## 2024-06-30 ENCOUNTER — Ambulatory Visit: Admitting: Physical Therapy

## 2024-07-02 ENCOUNTER — Ambulatory Visit

## 2024-07-06 ENCOUNTER — Ambulatory Visit: Admitting: Physical Therapy

## 2024-07-08 ENCOUNTER — Ambulatory Visit

## 2024-07-15 ENCOUNTER — Inpatient Hospital Stay: Admitting: Hematology and Oncology

## 2024-07-15 ENCOUNTER — Inpatient Hospital Stay

## 2024-07-15 ENCOUNTER — Inpatient Hospital Stay: Attending: Hematology and Oncology

## 2024-07-30 ENCOUNTER — Ambulatory Visit: Admitting: Radiology

## 2024-08-05 ENCOUNTER — Inpatient Hospital Stay

## 2024-08-05 ENCOUNTER — Inpatient Hospital Stay: Attending: Hematology and Oncology

## 2024-08-05 ENCOUNTER — Inpatient Hospital Stay: Admitting: Hematology and Oncology

## 2024-08-09 ENCOUNTER — Ambulatory Visit

## 2024-08-26 ENCOUNTER — Inpatient Hospital Stay

## 2024-08-26 ENCOUNTER — Inpatient Hospital Stay: Admitting: Hematology and Oncology

## 2024-09-16 ENCOUNTER — Inpatient Hospital Stay: Admitting: Hematology and Oncology

## 2024-09-16 ENCOUNTER — Inpatient Hospital Stay

## 2024-09-16 ENCOUNTER — Inpatient Hospital Stay: Attending: Hematology and Oncology

## 2024-12-09 ENCOUNTER — Encounter: Admitting: Internal Medicine
